# Patient Record
Sex: Male | Born: 1940 | Race: White | Hispanic: No | Marital: Married | Smoking: Former smoker
Health system: Southern US, Community
[De-identification: ages and names within clinical notes are randomized; demographics above are authoritative.]

## PROBLEM LIST (undated history)

## (undated) DIAGNOSIS — M479 Spondylosis, unspecified: Secondary | ICD-10-CM

## (undated) DIAGNOSIS — H269 Unspecified cataract: Secondary | ICD-10-CM

## (undated) DIAGNOSIS — E039 Hypothyroidism, unspecified: Secondary | ICD-10-CM

## (undated) DIAGNOSIS — K6289 Other specified diseases of anus and rectum: Secondary | ICD-10-CM

## (undated) DIAGNOSIS — Z9289 Personal history of other medical treatment: Secondary | ICD-10-CM

## (undated) DIAGNOSIS — H409 Unspecified glaucoma: Secondary | ICD-10-CM

## (undated) DIAGNOSIS — E291 Testicular hypofunction: Secondary | ICD-10-CM

## (undated) DIAGNOSIS — D649 Anemia, unspecified: Secondary | ICD-10-CM

## (undated) DIAGNOSIS — E785 Hyperlipidemia, unspecified: Secondary | ICD-10-CM

## (undated) DIAGNOSIS — T7840XA Allergy, unspecified, initial encounter: Secondary | ICD-10-CM

## (undated) DIAGNOSIS — E559 Vitamin D deficiency, unspecified: Secondary | ICD-10-CM

## (undated) DIAGNOSIS — R011 Cardiac murmur, unspecified: Secondary | ICD-10-CM

## (undated) DIAGNOSIS — K219 Gastro-esophageal reflux disease without esophagitis: Secondary | ICD-10-CM

## (undated) DIAGNOSIS — I8390 Asymptomatic varicose veins of unspecified lower extremity: Secondary | ICD-10-CM

## (undated) DIAGNOSIS — F411 Generalized anxiety disorder: Secondary | ICD-10-CM

## (undated) DIAGNOSIS — C4491 Basal cell carcinoma of skin, unspecified: Secondary | ICD-10-CM

## (undated) HISTORY — DX: Vitamin D deficiency, unspecified: E55.9

## (undated) HISTORY — DX: Testicular hypofunction: E29.1

## (undated) HISTORY — DX: Personal history of other medical treatment: Z92.89

## (undated) HISTORY — DX: Spondylosis, unspecified: M47.9

## (undated) HISTORY — DX: Unspecified glaucoma: H40.9

## (undated) HISTORY — DX: Asymptomatic varicose veins of unspecified lower extremity: I83.90

## (undated) HISTORY — DX: Hyperlipidemia, unspecified: E78.5

## (undated) HISTORY — PX: TUBAL LIGATION: SHX77

## (undated) HISTORY — DX: Other specified diseases of anus and rectum: K62.89

## (undated) HISTORY — DX: Unspecified cataract: H26.9

## (undated) HISTORY — PX: DENTAL SURGERY: SHX609

## (undated) HISTORY — DX: Hypothyroidism, unspecified: E03.9

## (undated) HISTORY — DX: Generalized anxiety disorder: F41.1

## (undated) HISTORY — DX: Allergy, unspecified, initial encounter: T78.40XA

## (undated) HISTORY — DX: Cardiac murmur, unspecified: R01.1

## (undated) HISTORY — PX: WISDOM TOOTH EXTRACTION: SHX21

## (undated) HISTORY — PX: OTHER SURGICAL HISTORY: SHX169

## (undated) HISTORY — DX: Anemia, unspecified: D64.9

## (undated) HISTORY — PX: SPINE SURGERY: SHX786

## (undated) HISTORY — DX: Gastro-esophageal reflux disease without esophagitis: K21.9

## (undated) HISTORY — DX: Basal cell carcinoma of skin, unspecified: C44.91

---
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
## 1998-12-01 ENCOUNTER — Encounter: Payer: Self-pay | Admitting: Family Medicine

## 1998-12-01 ENCOUNTER — Ambulatory Visit (HOSPITAL_COMMUNITY): Admission: RE | Admit: 1998-12-01 | Discharge: 1998-12-01 | Payer: Self-pay

## 2000-06-18 ENCOUNTER — Encounter (INDEPENDENT_AMBULATORY_CARE_PROVIDER_SITE_OTHER): Payer: Self-pay | Admitting: *Deleted

## 2000-06-18 ENCOUNTER — Encounter: Payer: Self-pay | Admitting: Family Medicine

## 2000-06-18 ENCOUNTER — Encounter: Admission: RE | Admit: 2000-06-18 | Discharge: 2000-06-18 | Payer: Self-pay | Admitting: Family Medicine

## 2001-08-21 ENCOUNTER — Encounter: Payer: Self-pay | Admitting: Gastroenterology

## 2001-12-10 DIAGNOSIS — Z9289 Personal history of other medical treatment: Secondary | ICD-10-CM

## 2001-12-10 HISTORY — DX: Personal history of other medical treatment: Z92.89

## 2004-03-26 ENCOUNTER — Emergency Department (HOSPITAL_COMMUNITY): Admission: EM | Admit: 2004-03-26 | Discharge: 2004-03-26 | Payer: Self-pay | Admitting: Emergency Medicine

## 2004-11-01 ENCOUNTER — Encounter: Admission: RE | Admit: 2004-11-01 | Discharge: 2004-11-01 | Payer: Self-pay | Admitting: Family Medicine

## 2005-11-21 ENCOUNTER — Ambulatory Visit: Payer: Self-pay | Admitting: Family Medicine

## 2005-11-29 ENCOUNTER — Ambulatory Visit: Payer: Self-pay | Admitting: Family Medicine

## 2005-12-13 ENCOUNTER — Ambulatory Visit: Payer: Self-pay | Admitting: Family Medicine

## 2007-08-29 ENCOUNTER — Encounter: Payer: Self-pay | Admitting: Family Medicine

## 2007-10-14 ENCOUNTER — Encounter: Admission: RE | Admit: 2007-10-14 | Discharge: 2007-10-14 | Payer: Self-pay | Admitting: Family Medicine

## 2007-11-18 ENCOUNTER — Encounter: Payer: Self-pay | Admitting: Family Medicine

## 2008-07-16 ENCOUNTER — Ambulatory Visit: Payer: Self-pay | Admitting: Family Medicine

## 2008-07-16 LAB — CONVERTED CEMR LAB
Nitrite: NEGATIVE
Protein, U semiquant: NEGATIVE
Specific Gravity, Urine: 1.02
Urobilinogen, UA: 0.2

## 2008-07-23 ENCOUNTER — Ambulatory Visit: Payer: Self-pay | Admitting: Family Medicine

## 2008-07-23 DIAGNOSIS — F411 Generalized anxiety disorder: Secondary | ICD-10-CM

## 2008-07-23 DIAGNOSIS — H409 Unspecified glaucoma: Secondary | ICD-10-CM | POA: Insufficient documentation

## 2008-07-23 DIAGNOSIS — K6289 Other specified diseases of anus and rectum: Secondary | ICD-10-CM

## 2008-07-23 DIAGNOSIS — M4800 Spinal stenosis, site unspecified: Secondary | ICD-10-CM

## 2008-07-23 DIAGNOSIS — M479 Spondylosis, unspecified: Secondary | ICD-10-CM

## 2008-07-23 HISTORY — DX: Unspecified glaucoma: H40.9

## 2008-07-23 HISTORY — DX: Generalized anxiety disorder: F41.1

## 2008-07-23 HISTORY — DX: Other specified diseases of anus and rectum: K62.89

## 2008-07-23 HISTORY — DX: Spondylosis, unspecified: M47.9

## 2008-07-23 LAB — CONVERTED CEMR LAB
ALT: 40 units/L — ABNORMAL HIGH (ref 0–35)
AST: 31 units/L (ref 0–37)
Albumin: 4.1 g/dL (ref 3.5–5.2)
Alkaline Phosphatase: 50 units/L (ref 39–117)
BUN: 15 mg/dL (ref 6–23)
Bilirubin, Direct: 0.2 mg/dL (ref 0.0–0.3)
CO2: 31 meq/L (ref 19–32)
Eosinophils Relative: 5.3 % — ABNORMAL HIGH (ref 0.0–5.0)
GFR calc Af Amer: 80 mL/min
Glucose, Bld: 126 mg/dL — ABNORMAL HIGH (ref 70–99)
HCT: 43 % (ref 36.0–46.0)
HDL: 45.9 mg/dL (ref >39.0)
Hemoglobin: 14.9 g/dL (ref 12.0–15.0)
LDL Cholesterol: 118 mg/dL — ABNORMAL HIGH (ref 0–99)
Lymphocytes Relative: 32.5 % (ref 12.0–46.0)
Monocytes Absolute: 0.5 10*3/uL (ref 0.1–1.0)
Monocytes Relative: 8.5 % (ref 3.0–12.0)
Platelets: 264 10*3/uL (ref 150–400)
Potassium: 3.8 meq/L (ref 3.5–5.1)
RDW: 13 % (ref 11.5–14.6)
Sodium: 142 meq/L (ref 135–145)
Total CHOL/HDL Ratio: 3.8
Total Protein: 6.9 g/dL (ref 6.0–8.3)
WBC: 6 10*3/uL (ref 4.5–10.5)

## 2008-07-27 ENCOUNTER — Encounter: Admission: RE | Admit: 2008-07-27 | Discharge: 2008-07-27 | Payer: Self-pay | Admitting: Family Medicine

## 2009-04-14 ENCOUNTER — Encounter: Payer: Self-pay | Admitting: Family Medicine

## 2010-04-28 ENCOUNTER — Ambulatory Visit: Payer: Self-pay | Admitting: Family Medicine

## 2010-04-28 DIAGNOSIS — R0789 Other chest pain: Secondary | ICD-10-CM

## 2010-04-28 DIAGNOSIS — I8390 Asymptomatic varicose veins of unspecified lower extremity: Secondary | ICD-10-CM

## 2010-04-28 DIAGNOSIS — E559 Vitamin D deficiency, unspecified: Secondary | ICD-10-CM | POA: Insufficient documentation

## 2010-04-28 DIAGNOSIS — E291 Testicular hypofunction: Secondary | ICD-10-CM

## 2010-04-28 DIAGNOSIS — E039 Hypothyroidism, unspecified: Secondary | ICD-10-CM

## 2010-04-28 DIAGNOSIS — K589 Irritable bowel syndrome without diarrhea: Secondary | ICD-10-CM | POA: Insufficient documentation

## 2010-04-28 DIAGNOSIS — D649 Anemia, unspecified: Secondary | ICD-10-CM | POA: Insufficient documentation

## 2010-04-28 DIAGNOSIS — T50995A Adverse effect of other drugs, medicaments and biological substances, initial encounter: Secondary | ICD-10-CM

## 2010-04-28 DIAGNOSIS — E785 Hyperlipidemia, unspecified: Secondary | ICD-10-CM

## 2010-04-28 DIAGNOSIS — R350 Frequency of micturition: Secondary | ICD-10-CM

## 2010-04-28 DIAGNOSIS — K219 Gastro-esophageal reflux disease without esophagitis: Secondary | ICD-10-CM

## 2010-04-28 HISTORY — DX: Hypothyroidism, unspecified: E03.9

## 2010-04-28 HISTORY — DX: Gastro-esophageal reflux disease without esophagitis: K21.9

## 2010-04-28 HISTORY — DX: Vitamin D deficiency, unspecified: E55.9

## 2010-04-28 HISTORY — DX: Testicular hypofunction: E29.1

## 2010-04-28 HISTORY — DX: Anemia, unspecified: D64.9

## 2010-04-28 HISTORY — DX: Hyperlipidemia, unspecified: E78.5

## 2010-04-28 HISTORY — DX: Asymptomatic varicose veins of unspecified lower extremity: I83.90

## 2010-05-10 ENCOUNTER — Encounter: Payer: Self-pay | Admitting: Family Medicine

## 2010-07-05 ENCOUNTER — Encounter (INDEPENDENT_AMBULATORY_CARE_PROVIDER_SITE_OTHER): Payer: Self-pay | Admitting: *Deleted

## 2010-08-16 ENCOUNTER — Encounter: Payer: Self-pay | Admitting: Gastroenterology

## 2010-08-29 ENCOUNTER — Telehealth: Payer: Self-pay | Admitting: Family Medicine

## 2010-09-07 ENCOUNTER — Ambulatory Visit
Admission: RE | Admit: 2010-09-07 | Discharge: 2010-09-07 | Payer: Self-pay | Source: Home / Self Care | Attending: Family Medicine | Admitting: Family Medicine

## 2010-10-09 LAB — CONVERTED CEMR LAB
AST: 21 units/L (ref 0–37)
Alkaline Phosphatase: 44 units/L (ref 39–117)
Basophils Absolute: 0 10*3/uL (ref 0.0–0.1)
Basophils Relative: 0.8 % (ref 0.0–3.0)
Bilirubin, Direct: 0.3 mg/dL (ref 0.0–0.3)
Blood in Urine, dipstick: NEGATIVE
CO2: 30 meq/L (ref 19–32)
Calcium: 9.4 mg/dL (ref 8.4–10.5)
Creatinine, Ser: 0.9 mg/dL (ref 0.4–1.5)
Eosinophils Absolute: 0.1 10*3/uL (ref 0.0–0.7)
GFR calc non Af Amer: 93.73 mL/min (ref >60)
HDL: 43.6 mg/dL (ref >39.00)
Ketones, urine, test strip: NEGATIVE
LDL Cholesterol: 119 mg/dL — ABNORMAL HIGH (ref 0–99)
Lymphocytes Relative: 24.5 % (ref 12.0–46.0)
MCHC: 34.6 g/dL (ref 30.0–36.0)
Monocytes Relative: 8.2 % (ref 3.0–12.0)
Neutrophils Relative %: 63.5 % (ref 43.0–77.0)
Nitrite: NEGATIVE
Protein, U semiquant: NEGATIVE
RBC: 4.22 M/uL (ref 4.22–5.81)
Sodium: 139 meq/L (ref 135–145)
Testosterone: 334.48 ng/dL — ABNORMAL LOW (ref 350.00–890.00)
Total CHOL/HDL Ratio: 4
Triglycerides: 91 mg/dL (ref 0.0–149.0)
Urobilinogen, UA: 0.2
VLDL: 18.2 mg/dL (ref 0.0–40.0)
Vit D, 25-Hydroxy: 32 ng/mL (ref 30–89)
WBC Urine, dipstick: NEGATIVE

## 2010-10-11 NOTE — Assessment & Plan Note (Signed)
Summary: PT RESCHEDULE OV,DR KAPLAN STILL IN PROCEDURE   History of Present Illness Visit Type: new patient Primary GI MD: Melvia Heaps MD [facility] Primary Provider: London Sheer Chief Complaint: pt is having abdominal discomfort that he associates with IBS  GI Review of Systems      Denies abdominal pain, acid reflux, belching, bloating, chest pain, dysphagia with liquids, dysphagia with solids, heartburn, loss of appetite, nausea, vomiting, vomiting blood, weight loss, and  weight gain.      Reports irritable bowel syndrome.     Denies anal fissure, black tarry stools, change in bowel habit, constipation, diarrhea, diverticulosis, fecal incontinence, heme positive stool, hemorrhoids, jaundice, light color stool, liver problems, rectal bleeding, and  rectal pain. Preventive Screening-Counseling & Management  Alcohol-Tobacco     Smoking Status: quit      Drug Use:  no.      Current Medications (verified): 1)  Diazepam 5 Mg Tabs (Diazepam) .Marland Kitchen.. 1 Tid 2)  Protonix 40 Mg Tbec (Pantoprazole Sodium) .Marland Kitchen.. 1 Qd 3)  Proctosol Hc 2.5 % Crea (Hydrocortisone) .... Apply Bid 4)  Xalatan 0.005 % Soln (Latanoprost) .... Both Eyes 5)  Timolol Maleate 0.25 % Soln (Timolol Maleate) .... Left Eye 6)  Meloxicam 15 Mg Tabs (Meloxicam) .Marland Kitchen.. 1 Once Daily For Arthritis 7)  Androgel Pump 1.62% .... Apply 1 Pump On Each Shoulder Each Day 8)  Aspirin 81 Mg Tabs (Aspirin) .... Take 1 Tablet By Mouth Once A Day  Allergies (verified): No Known Drug Allergies  Past History:  Past Medical History: Colon polyps GERD Hyperlipidemia Hypothyroidism  Past Surgical History: none  Family History: No FH of Colon Cancer:  Social History: Married, 3 boys Patient is a former smoker.  Alcohol Use - yes--2 drinks/day Daily Caffeine Use--1 cup/day Illicit Drug Use - no Smoking Status:  quit Drug Use:  no  Review of Systems  The patient denies allergy/sinus, anemia, anxiety-new, arthritis/joint  pain, back pain, blood in urine, breast changes/lumps, confusion, cough, coughing up blood, depression-new, fainting, fatigue, fever, headaches-new, hearing problems, heart murmur, heart rhythm changes, itching, muscle pains/cramps, night sweats, nosebleeds, shortness of breath, skin rash, sleeping problems, sore throat, swelling of feet/legs, swollen lymph glands, thirst - excessive, urination - excessive, urination changes/pain, urine leakage, vision changes, and voice change.    Vital Signs:  Patient profile:   70 year old male Height:      69.5 inches Weight:      210 pounds BMI:     30.68 Pulse rate:   64 / minute Pulse rhythm:   regular BP sitting:   124 / 70  (left arm) Cuff size:   regular  Vitals Entered By: Francee Piccolo CMA Duncan Dull) (August 16, 2010 1:43 PM)   Patient Instructions: 1)  Copy sent to : London Sheer

## 2010-10-11 NOTE — Procedures (Signed)
Summary: Colonoscopy   Colonoscopy  Procedure date:  08/21/2001  Findings:      Results: Polyp.  Results: Diverticulosis.         Patient Name: Tyrone Diaz, Tyrone Diaz MRN:  Procedure Procedures: Colonoscopy CPT: 44010.    with Hot Biopsy(s)CPT: [id number].  Personnel: Endoscopist: Barbette Hair. Arlyce Dice, MD.  Referred By: Barbera Setters Artis Flock, MD.  Exam Location: Exam performed in Outpatient Clinic. Outpatient  Patient Consent: Procedure, Alternatives, Risks and Benefits discussed, consent obtained, from patient.  Indications  Average Risk Screening Routine.  History  Pre-Exam Physical: Performed Aug 21, 2001. Cardio-pulmonary exam, Rectal exam, HEENT exam , Abdominal exam, Extremity exam, Neurological exam, Mental status exam WNL.  Exam Exam: Extent of exam reached: Cecum, extent intended: Cecum.  ASA Classification: I. Tolerance: excellent.  Monitoring: Pulse and BP monitoring, Oximetry used. Supplemental O2 given.  Colon Prep Used Golytely for colon prep. Prep results: good.  Sedation Meds: Patient assessed and found to be appropriate for moderate (conscious) sedation. Fentanyl 100 mcg. Versed 10 mg.  Findings - DIVERTICULOSIS: Descending Colon. ICD9: Sep 09, 1898.  - DIVERTICULOSIS: Sigmoid Colon. ICD9: Sep 09, 1898.  DIVERTICULOSIS: Sigmoid Colon. ICD9: Sep 09, 1898.  POLYP: Sigmoid Colon, Maximum size: 3 mm. Distance from Anus 30 cm. Procedure:  hot biopsy, ICD9: Colon Polyps: 211.3.   Assessment Abnormal examination, see findings above.  Diagnoses: 562.10: Diverticulosis.  211.3: Colon Polyps.   Events  Unplanned Interventions: No intervention was required.  Unplanned Events: There were no complications. Plans Medication Plan: Await pathology.  Patient Education: Patient given standard instructions for: Polyps. Diverticulosis.  Disposition: After procedure patient sent to recovery. After recovery patient sent home.  Scheduling/Referral: Colonoscopy, to  Barbette Hair. Arlyce Dice, MD, If adenoma, around Aug 21, 2004.

## 2010-10-11 NOTE — Miscellaneous (Signed)
Summary: Waiver of Liability for Zostavax  Waiver of Liability for Zostavax   Imported By: Maryln Gottron 05/02/2010 10:39:54  _____________________________________________________________________  External Attachment:    Type:   Image     Comment:   External Document

## 2010-10-11 NOTE — Letter (Signed)
Summary: New Patient letter  [facility]  [address] [place], [place] [address]   Phone: [phone]  Fax: [phone]       07/05/2010 MRN: [id number]  Tyrone Diaz [address] [place], [place]  [address]  Dear Tyrone Diaz,  Welcome to the Gastroenterology Division at [facility].    You are scheduled to see Dr.  Arlyce Dice on 08-16-10 at 1:30p.m. on the 3rd floor at [facility], [address]  We ask that you try to arrive at our office 15 minutes prior to your appointment time to allow for check-in.  We would like you to complete the enclosed self-administered evaluation form prior to your visit and bring it with you on the day of your appointment.  We will review it with you.  Also, please bring a complete list of all your medications or, if you prefer, bring the medication bottles and we will list them.  Please bring your insurance card so that we may make a copy of it.  If your insurance requires a referral to see a specialist, please bring your referral form from your primary care physician.  Co-payments are due at the time of your visit and may be paid by cash, check or credit card.     Your office visit will consist of a consult with your physician (includes a physical exam), any laboratory testing he/she may order, scheduling of any necessary diagnostic testing (e.g. x-ray, ultrasound, CT-scan), and scheduling of a procedure (e.g. Endoscopy, Colonoscopy) if required.  Please allow enough time on your schedule to allow for any/all of these possibilities.    If you cannot keep your appointment, please call [phone] to cancel or reschedule prior to your appointment date.  This allows [place] the opportunity to schedule an appointment for another patient in need of care.  If you do not cancel or reschedule by 5 p.m. the business day prior to your appointment date, you will be charged a $50.00 late cancellation/no-show fee.    Thank you for choosing [facility] for your medical needs.  We appreciate the opportunity to care for you.  Please visit [place] at our website  to learn more about our practice.                     Sincerely,                                                             The Gastroenterology Division

## 2010-10-11 NOTE — Medication Information (Signed)
Summary: Androgel Approved  Androgel Approved   Imported By: Maryln Gottron 05/13/2010 10:08:51  _____________________________________________________________________  External Attachment:    Type:   Image     Comment:   External Document

## 2010-10-11 NOTE — Assessment & Plan Note (Signed)
Summary: emp/pt coming in fasting/cjr   Vital Signs:  Patient profile:   Tyrone Diaz Height:      69.5 inches Weight:      204 pounds BMI:     29.80 O2 Sat:      97 % Temp:     98 degrees F Pulse rate:   65 / minute Pulse rhythm:   regular BP sitting:   120 / 82  (left arm)  Vitals Entered By: Pura Spice, RN (April 28, 2010 8:53 AM) CC: go over problems refills     History of Present Illness: This 70 year old white Diaz retired from [employer] is in to discuss his knuckle problems and get refill of medications He has past history of GERD which is controlled with Protonix History of glaucoma control and treated by Dr. Dr. His main complaint today is that of varicosities of the right calf and popliteal area, has no pain no edema Has had history of some arthritic problem with the joint has been taking meloxicam 50 mg each day p.r.n. 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 Daphene Jaeger in the past for cardiac workup and an echocardiogram and has no problem  Allergies (verified): No Known Drug Allergies  Review of Systems      See HPI  The patient denies anorexia, fever, weight loss, weight gain, vision loss, decreased hearing, hoarseness, chest pain, syncope, dyspnea on exertion, peripheral edema, prolonged cough, headaches, hemoptysis, abdominal pain, melena, hematochezia, severe indigestion/heartburn, hematuria, incontinence, genital sores, muscle weakness, suspicious skin lesions, transient blindness, difficulty walking, depression, unusual weight change, abnormal bleeding, enlarged lymph nodes, angioedema, breast masses, and testicular masses.    Physical  Exam  General:  Well-developed,well-nourished,in no acute distress; alert,appropriate and cooperative throughout examination Head:  Normocephalic and atraumatic without obvious abnormalities. No apparent alopecia or balding. Eyes:  No corneal or conjunctival inflammation noted. EOMI. Perrla. Funduscopic exam benign, without hemorrhages, exudates or papilledema. Vision grossly normal. Ears:  External ear exam shows no significant lesions or deformities.  Otoscopic examination reveals clear canals, tympanic membranes are intact bilaterally without bulging, retraction, inflammation or discharge. Hearing is grossly normal bilaterally. Nose:  External nasal examination shows no deformity or inflammation. Nasal mucosa are pink and moist without lesions or exudates. Mouth:  Oral mucosa and oropharynx without lesions or exudates.  Teeth in good repair. Chest Wall:  No deformities, masses, tenderness or gynecomastia noted. Breasts:  No masses or gynecomastia noted Lungs:  Normal respiratory effort, chest expands symmetrically. Lungs are clear to auscultation, no crackles or wheezes. Heart:  Normal rate and regular rhythm. S1 and S2 normal without gallop, murmur, click, rub or other extra sounds. Abdomen:  Bowel sounds positive,abdomen soft and non-tender without masses, organomegaly or hernias noted. Rectal:  No external abnormalities noted. Normal sphincter tone. No rectal masses or tenderness. Genitalia:  Testes bilaterally descended without nodularity, tenderness or masses. No scrotal masses or lesions. No penis lesions or urethral discharge. Prostate:  Prostate gland firm and smooth, no enlargement, nodularity, tenderness, mass, asymmetry or induration. Msk:  mild to moderate her cast is a right calf and popliteal region nontender Pulses:  R and L carotid,radial,femoral,dorsalis pedis  and posterior tibial pulses are full and equal bilaterally Extremities:  No clubbing, cyanosis, edema, or deformity  noted with normal full range of motion of all joints.   Neurologic:  No cranial nerve deficits noted. Station and gait are normal. Plantar reflexes are down-going bilaterally. DTRs are symmetrical throughout. Sensory, motor and coordinative functions appear intact. Skin:  Intact without suspicious lesions or rashestattoo(s).   Cervical Nodes:  No lymphadenopathy noted Axillary Nodes:  No palpable lymphadenopathy Inguinal Nodes:  No significant adenopathy Psych:  Cognition and judgment appear intact. Alert and cooperative with normal attention span and concentration. No apparent delusions, illusions, hallucinations   Impression & Recommendations:  Problem # 1:  ANXIETY (ICD-300.00) Assessment New  His updated medication list for this problem includes:    Diazepam 5 Mg Tabs (Diazepam) .Marland Kitchen... 1 tid  Orders: Prescription Created Electronically [phone])  Problem # 2:  VARICOSE VEINS, LOWER EXTREMITIES (ICD-454.9) Assessment: New  no tx  Orders: Prescription Created Electronically [phone])  Problem # 3:  GERD (ICD-530.81) Assessment: Improved  His updated medication list for this problem includes:    Protonix 40 Mg Tbec (Pantoprazole sodium) .Marland Kitchen... 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 1 tid 2)  Protonix 40 Mg Tbec (Pantoprazole sodium) .Marland Kitchen.. 1 qd 3)  Proctosol Hc 2.5 % Crea (Hydrocortisone) .... Apply bid 4)  Xalatan 0.005 % Soln (Latanoprost) .... Both eyes 5)  Timolol Maleate 0.25 % Soln (Timolol maleate) .... Left eye 6)  Meloxicam 15 Mg Tabs (Meloxicam) .Marland Kitchen.. 1 once daily for  arthritis 7)  Androgel Pump 1.62%  .... Apply 1 pump on each shoulder each day  Other Orders: Venipuncture (02725) T-Vitamin D (25-Hydroxy) (36644-03474) UA Dipstick w/o Micro (automated)  (81003) EKG w/ Interpretation (93000) Pneumococcal Vaccine (25956) Admin 1st Vaccine (38756) Zoster (Shingles) Vaccine Live (43329) Admin of Any Addtl Vaccine (51884) Specimen Handling (16606) TLB-Lipid Panel (80061-LIPID) TLB-BMP (Basic Metabolic Panel-BMET) (80048-METABOL) TLB-CBC Platelet - w/Differential (85025-CBCD) TLB-Hepatic/Liver Function Pnl (80076-HEPATIC) TLB-TSH (Thyroid Stimulating Hormone) (84443-TSH) TLB-PSA (Prostate Specific Antigen) (84153-PSA) TLB-Testosterone, Total (84403-TESTO)  Patient Instructions: 1)  Will refill medications 2)  Cardizem results of the lab studies and I feel the testosterone level will most likely be due to low and we will start AndroGel Prescriptions: ANDROGEL PUMP 1.62% apply 1 pump on each shoulder each day  #88gms x 5   Entered and Authorized by:   Judithann Sheen MD   Signed by:   Judithann Sheen MD on 05/04/2010   Method used:   Printed then faxed to ...       [facility] [address] [phone]* (retail)       [address] [place], [place]  [address]       Ph: [phone]       Fax: [phone]   RxID:   [id number] MELOXICAM 15 MG TABS (MELOXICAM) 1 once daily for arthritis  #90 x 3   Entered and Authorized by:   Judithann Sheen MD   Signed by:   Judithann Sheen MD on 04/28/2010   Method used:   Print then Give to Patient   RxID:  [id number] PROCTOSOL HC 2.5 % CREA (HYDROCORTISONE) apply bid  #45 gms x 5   Entered and Authorized by:   Judithann Sheen MD   Signed by:   Judithann Sheen MD on 04/28/2010   Method used:   Print then Give to Patient   RxID:   [id number] PROTONIX 40 MG TBEC (PANTOPRAZOLE SODIUM) 1 qd  #90 x 3   Entered and Authorized by:   Judithann Sheen MD   Signed by:    Judithann Sheen MD on 04/28/2010   Method used:   Print then Give to Patient   RxID:   [id number] DIAZEPAM 5 MG TABS (DIAZEPAM) 1 tid  #90 x 1   Entered and Authorized by:   Judithann Sheen MD   Signed by:   Judithann Sheen MD on 04/28/2010   Method used:   Print then Give to Patient   RxID:   [id number]    Eye Exam  02-2010 had eye exam with Dr Elmer Picker     Immunizations Administered:  Pneumonia Vaccine:    Vaccine Type: Pneumovax    Site: right deltoid    Mfr: Merck    Dose: 0.5 ml    Route: IM    Given by: Pura Spice, RN    Exp. Date: 09/10/2011    Lot #: [id number]    VIS given: 04/08/96 version given April 28, 2010.  Zostavax # 1:    Vaccine Type: Zostavax    Site: left deltoid    Mfr: Merck    Dose: 0.5 ml    Route: Cruger    Given by: Pura Spice, RN    Exp. Date: 04/15/2011    Lot #: [id number]    VIS given: 06/23/05 given April 28, 2010.   Laboratory Results   Urine Tests    Routine Urinalysis   Color: yellow Appearance: Clear Glucose: negative   (Normal Range: Negative) Bilirubin: negative   (Normal Range: Negative) Ketone: negative   (Normal Range: Negative) Spec. Gravity: 1.015   (Normal Range: 1.003-1.035) Blood: negative   (Normal Range: Negative) pH: 7.0   (Normal Range: 5.0-8.0) Protein: negative   (Normal Range: Negative) Urobilinogen: 0.2   (Normal Range: 0-1) Nitrite: negative   (Normal Range: Negative) Leukocyte Esterace: negative   (Normal Range: Negative)    Comments: Rita Ohara  April 28, 2010 11:00 AM

## 2010-10-13 NOTE — Progress Notes (Signed)
  Phone Note Call from Patient   Caller: Patient Call For: Judithann Sheen MD Summary of Call: Pt is calling to ask to get his Testosterone levels checked.  Please schedule appt. [phone] Initial call taken by: Lynann Beaver CMA AAMA,  August 29, 2010 11:23 AM  Follow-up for Phone Call        Dr. Scotty Court will call pt to discuss Follow-up by: Trixie Dredge,  September 01, 2010 6:24 PM

## 2011-01-19 ENCOUNTER — Other Ambulatory Visit: Payer: Self-pay | Admitting: Family Medicine

## 2011-01-19 MED ORDER — DIAZEPAM 5 MG PO TABS: 5.0000 mg | ORAL_TABLET | Freq: Three times a day (TID) | ORAL | Status: AC | PRN

## 2011-01-19 NOTE — Telephone Encounter (Signed)
[facility] [place]

## 2011-01-19 NOTE — Telephone Encounter (Signed)
Refill Diazepam until his August cpx. [facility] [facility] [place].

## 2011-05-01 ENCOUNTER — Encounter: Payer: Self-pay | Admitting: Family Medicine

## 2011-05-02 ENCOUNTER — Ambulatory Visit (INDEPENDENT_AMBULATORY_CARE_PROVIDER_SITE_OTHER): Payer: Medicare Other | Admitting: Family Medicine

## 2011-05-02 ENCOUNTER — Encounter: Payer: Self-pay | Admitting: Family Medicine

## 2011-05-02 VITALS — BP 112/62 | HR 51 | Temp 97.8°F | Ht 69.5 in | Wt 209.0 lb

## 2011-05-02 DIAGNOSIS — M48061 Spinal stenosis, lumbar region without neurogenic claudication: Secondary | ICD-10-CM

## 2011-05-02 DIAGNOSIS — E785 Hyperlipidemia, unspecified: Secondary | ICD-10-CM

## 2011-05-02 DIAGNOSIS — M7661 Achilles tendinitis, right leg: Secondary | ICD-10-CM

## 2011-05-02 DIAGNOSIS — E559 Vitamin D deficiency, unspecified: Secondary | ICD-10-CM

## 2011-05-02 DIAGNOSIS — Z136 Encounter for screening for cardiovascular disorders: Secondary | ICD-10-CM

## 2011-05-02 DIAGNOSIS — M7660 Achilles tendinitis, unspecified leg: Secondary | ICD-10-CM

## 2011-05-02 DIAGNOSIS — E039 Hypothyroidism, unspecified: Secondary | ICD-10-CM

## 2011-05-02 DIAGNOSIS — E291 Testicular hypofunction: Secondary | ICD-10-CM

## 2011-05-02 DIAGNOSIS — K589 Irritable bowel syndrome without diarrhea: Secondary | ICD-10-CM

## 2011-05-02 DIAGNOSIS — R351 Nocturia: Secondary | ICD-10-CM

## 2011-05-02 DIAGNOSIS — N401 Enlarged prostate with lower urinary tract symptoms: Secondary | ICD-10-CM

## 2011-05-02 DIAGNOSIS — D649 Anemia, unspecified: Secondary | ICD-10-CM

## 2011-05-02 DIAGNOSIS — I10 Essential (primary) hypertension: Secondary | ICD-10-CM

## 2011-05-02 LAB — LIPID PANEL
HDL: 48.1 mg/dL (ref >39.00)
LDL Cholesterol: 120 mg/dL — ABNORMAL HIGH (ref 0–99)
Total CHOL/HDL Ratio: 4
Triglycerides: 117 mg/dL (ref 0.0–149.0)

## 2011-05-02 LAB — CBC WITH DIFFERENTIAL/PLATELET
Basophils Absolute: 0 10*3/uL (ref 0.0–0.1)
Eosinophils Absolute: 0.1 10*3/uL (ref 0.0–0.7)
Lymphocytes Relative: 23.1 % (ref 12.0–46.0)
Lymphs Abs: 1.4 10*3/uL (ref 0.7–4.0)
MCHC: 33.8 g/dL (ref 30.0–36.0)
Monocytes Relative: 7.4 % (ref 3.0–12.0)
Platelets: 268 10*3/uL (ref 150.0–400.0)
RDW: 14.2 % (ref 11.5–14.6)

## 2011-05-02 LAB — TSH: TSH: 1.29 u[IU]/mL (ref 0.35–5.50)

## 2011-05-02 LAB — POCT URINALYSIS DIPSTICK
Bilirubin, UA: NEGATIVE
Ketones, UA: NEGATIVE
Leukocytes, UA: NEGATIVE
Spec Grav, UA: 1.01
pH, UA: 6

## 2011-05-02 LAB — HEPATIC FUNCTION PANEL
Alkaline Phosphatase: 50 U/L (ref 39–117)
Bilirubin, Direct: 0.2 mg/dL (ref 0.0–0.3)
Total Protein: 7 g/dL (ref 6.0–8.3)

## 2011-05-02 LAB — BASIC METABOLIC PANEL
BUN: 15 mg/dL (ref 6–23)
CO2: 29 mEq/L (ref 19–32)
Calcium: 9.7 mg/dL (ref 8.4–10.5)
GFR: 81.33 mL/min (ref >60.00)
Glucose, Bld: 111 mg/dL — ABNORMAL HIGH (ref 70–99)

## 2011-05-02 MED ORDER — PANTOPRAZOLE SODIUM 40 MG PO TBEC: 40.0000 mg | DELAYED_RELEASE_TABLET | Freq: Every day | ORAL | Status: DC

## 2011-05-02 MED ORDER — IBUPROFEN 800 MG PO TABS: ORAL_TABLET | ORAL | Status: DC

## 2011-05-02 MED ORDER — TESTOSTERONE 20.25 MG/ACT (1.62%) TD GEL: 1.0000 | Freq: Every day | TRANSDERMAL | Status: DC

## 2011-05-02 MED ORDER — DIAZEPAM 5 MG PO TABS: 5.0000 mg | ORAL_TABLET | Freq: Three times a day (TID) | ORAL | Status: DC

## 2011-05-02 MED ORDER — HYOSCYAMINE SULFATE ER 0.375 MG PO TB12: ORAL_TABLET | ORAL | Status: DC

## 2011-05-02 NOTE — Patient Instructions (Signed)
You're doing very well physical examination reveals a healthy male here problem with irritable bowel should be helped with Levbid twice a day and you can decide on whether to continue twice a day are once per day Far as his spinal stenosis and arthritis and inflammation I think it's advantageous to take ibuprofen 800 mg 3 times daily after meals on our continue to use Vicodin as you see fit I will call results and lab either tonight or tomorrow Medications have been refilled

## 2011-05-03 ENCOUNTER — Other Ambulatory Visit: Payer: Self-pay

## 2011-05-03 LAB — VITAMIN D 25 HYDROXY (VIT D DEFICIENCY, FRACTURES): Vit D, 25-Hydroxy: 30 ng/mL (ref 30–89)

## 2011-05-03 MED ORDER — TESTOSTERONE 20.25 MG/ACT (1.62%) TD GEL: 4.0000 | Freq: Every day | TRANSDERMAL | Status: DC

## 2011-05-03 NOTE — Telephone Encounter (Signed)
Pt is aware rx has been sent to pharmacy.

## 2011-05-03 NOTE — Progress Notes (Signed)
Quick Note:  Pt aware ______ 

## 2011-05-08 NOTE — Progress Notes (Signed)
  Subjective:    Patient ID: Tyrone Diaz, male    DOB: 02/02/1941, 70 y.o.   MRN: [id number] 70 year old white married retired male is in today to discuss his medical problems, for medications and get necessary lab studies. 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 and he feels that it is beneficial   Review of Systems see history of present illness patient is an avid Public librarian  and when he plays tennis no shortness of breath no chest pain        Physical Exam the patient is a well-developed well-nourished slightly obese white male in no distress HEENT negative Lungs clear to palpation percussion and auscultation no rales no dullness Heart no evidence of cardiomegaly heart sounds are good regular no murmurs peripheral pulses good bilaterally  EKG normal Abdomen liver spleen kidneys  Are normal, bowel sounds slightly hyperactive Genitalia normal Rectal exam reveals prostate to be normal Spine negative examination Extremities tenderness right Achilles tendon Skin no abnormalities noted          Assessment & Plan:  Hypogonadism continue Androgel can increase to 4 applications per day Irritable bowel syndrome start Levbid 0.375 mg twice a day Genella Rife continue protonix Achilles tendinitis ibuprofen  800 mg 3 times daily after meals, take hydrocodone as needed Glaucoma continue treatment t prescribed by Dr. Elmer Picker Continue tennis and regular exercise

## 2011-05-10 ENCOUNTER — Telehealth: Payer: Self-pay | Admitting: Family Medicine

## 2011-05-10 NOTE — Telephone Encounter (Signed)
A copy of pt's labs have been sent.

## 2011-05-10 NOTE — Telephone Encounter (Signed)
Message on triage line. Pt would like a copy of his most recent labs mailed to his home. Please call if necessary.

## 2011-05-17 ENCOUNTER — Other Ambulatory Visit: Payer: Self-pay

## 2011-05-17 MED ORDER — TESTOSTERONE 20.25 MG/ACT (1.62%) TD GEL: 4.0000 | Freq: Every day | TRANSDERMAL | Status: DC

## 2011-05-29 ENCOUNTER — Telehealth: Payer: Self-pay | Admitting: *Deleted

## 2011-05-29 NOTE — Telephone Encounter (Signed)
Pt is asking for a refill on Vicodin 5/500.

## 2011-05-31 MED ORDER — HYDROCODONE-ACETAMINOPHEN 5-500 MG PO TABS: 1.0000 | ORAL_TABLET | Freq: Four times a day (QID) | ORAL | Status: DC | PRN

## 2011-05-31 NOTE — Telephone Encounter (Signed)
Rx sent in to pharmacy. 

## 2011-05-31 NOTE — Telephone Encounter (Signed)
Call in #60 with 2 rf 

## 2011-09-20 DIAGNOSIS — H04129 Dry eye syndrome of unspecified lacrimal gland: Secondary | ICD-10-CM | POA: Diagnosis not present

## 2011-09-20 DIAGNOSIS — H2510 Age-related nuclear cataract, unspecified eye: Secondary | ICD-10-CM | POA: Diagnosis not present

## 2011-09-20 DIAGNOSIS — H4011X0 Primary open-angle glaucoma, stage unspecified: Secondary | ICD-10-CM | POA: Diagnosis not present

## 2011-09-20 DIAGNOSIS — H1045 Other chronic allergic conjunctivitis: Secondary | ICD-10-CM | POA: Diagnosis not present

## 2011-09-26 ENCOUNTER — Telehealth: Payer: Self-pay | Admitting: *Deleted

## 2011-09-26 NOTE — Telephone Encounter (Signed)
Not yet. We are in the process of calling each and every patient to encourage them to do so. Haven't reached his name on the list yet.

## 2011-09-26 NOTE — Telephone Encounter (Signed)
Pt wants a referral to Urology as he feels the Androgel is not working.

## 2011-09-26 NOTE — Telephone Encounter (Signed)
Has this patient been assigned to any providers yet?

## 2011-09-27 NOTE — Telephone Encounter (Signed)
This is not an urgent issue. Let's wait and send this to whoever sees him

## 2011-09-27 NOTE — Telephone Encounter (Signed)
Pt has been informed of info below as noted. Pt is going to try and est with [facility] location.

## 2011-09-27 NOTE — Telephone Encounter (Signed)
Tyrone Diaz, can you reach out to this pt and make him aware of Dr. Claris Che recommendation and URGE him to establish with someone so he can obtain this referral?

## 2011-10-02 DIAGNOSIS — M62838 Other muscle spasm: Secondary | ICD-10-CM | POA: Diagnosis not present

## 2011-10-02 DIAGNOSIS — M999 Biomechanical lesion, unspecified: Secondary | ICD-10-CM | POA: Diagnosis not present

## 2011-10-02 DIAGNOSIS — M5430 Sciatica, unspecified side: Secondary | ICD-10-CM | POA: Diagnosis not present

## 2011-10-04 DIAGNOSIS — M5430 Sciatica, unspecified side: Secondary | ICD-10-CM | POA: Diagnosis not present

## 2011-10-04 DIAGNOSIS — M62838 Other muscle spasm: Secondary | ICD-10-CM | POA: Diagnosis not present

## 2011-10-04 DIAGNOSIS — M999 Biomechanical lesion, unspecified: Secondary | ICD-10-CM | POA: Diagnosis not present

## 2011-10-19 ENCOUNTER — Encounter: Payer: Self-pay | Admitting: Internal Medicine

## 2011-10-19 ENCOUNTER — Ambulatory Visit (INDEPENDENT_AMBULATORY_CARE_PROVIDER_SITE_OTHER): Payer: Medicare Other | Admitting: Internal Medicine

## 2011-10-19 VITALS — BP 124/70 | HR 55 | Temp 98.4°F | Ht 70.0 in | Wt 197.0 lb

## 2011-10-19 DIAGNOSIS — R7309 Other abnormal glucose: Secondary | ICD-10-CM

## 2011-10-19 DIAGNOSIS — K589 Irritable bowel syndrome without diarrhea: Secondary | ICD-10-CM

## 2011-10-19 DIAGNOSIS — E291 Testicular hypofunction: Secondary | ICD-10-CM

## 2011-10-19 DIAGNOSIS — E785 Hyperlipidemia, unspecified: Secondary | ICD-10-CM

## 2011-10-19 DIAGNOSIS — R739 Hyperglycemia, unspecified: Secondary | ICD-10-CM

## 2011-10-19 DIAGNOSIS — M4800 Spinal stenosis, site unspecified: Secondary | ICD-10-CM | POA: Diagnosis not present

## 2011-10-19 NOTE — Progress Notes (Signed)
  Subjective:    Patient ID: Tyrone Diaz, male    DOB: January 03, 1941, 71 y.o.   MRN: [id number]  HPI New patient, transferring from Dr. Scotty Court The chart is reviewed, including previous visit notes and labs. Past medical history is updated. I notice his CBGs have been elevated twice and he also has some mild hyperkalemia. Needs labs. His main concern today is hypogonadism, see assessment and plan.   Past Medical History: Hypogonadism Colon polyps IBS Glaucoma, Sees ophthalmology  Anxiety,  valium prn Spinal stenosis, on hydrocodone , Rx by pain management Dr Arvil Persons   Past Surgical History: none  Family History: Prostate cancer -- no Colon Cancer--no Diabetes-- no CAD-- F MI at age 84  Stroke-- M side  Mom 49 y/o  Social History: Married, 3 boys Tobacco-- quit 1979 Alcohol Use - socially Drug Use:  no    Review of Systems No chest pain or shortness of breath Denies any headaches, no dysuria, difficulty urinating. No nausea, vomiting, diarrhea. No blood in the stools.    Objective:   Physical Exam  Constitutional: He is oriented to person, place, and time. He appears well-developed and well-nourished.  HENT:  Head: Normocephalic and atraumatic.  Neck: No thyromegaly present.  Cardiovascular: Normal rate, regular rhythm and normal heart sounds.   No murmur heard. Pulmonary/Chest: Effort normal and breath sounds normal. No respiratory distress. He has no wheezes. He has no rales. He exhibits no tenderness.  Musculoskeletal: He exhibits no edema.  Neurological: He is alert and oriented to person, place, and time.  Psychiatric: He has a normal mood and affect. His behavior is normal. Judgment and thought content normal.      Assessment & Plan:  Today , I spent more than 30  min with the patient, >50% of the time counseling, and /or reviewing the chart and labs ordered by other providers

## 2011-10-19 NOTE — Assessment & Plan Note (Addendum)
In 2011, the patient complained of decreased libido and ED His testosterone was 334, slightly low and he was started on  HRT. Subsequent  testosterone level was 244 and he was recommended to increase the testosterone dose. His last testosterone   was 206, he was then recommended to increase testosterone to 4 pumps daily. His libido and 80 has improved. On chart review however he never had a FSH, LH or prolactin level drawn for baseline. Plan: We are checking a testosterone level today but at the same time will recommend to discontinue testosterone, come back in 10 weeks to get a new baseline of testosterone, and check FSH, LH and prolactin. Further advice in 10 weeks.

## 2011-10-19 NOTE — Assessment & Plan Note (Signed)
Last LDL 120. Family history of heart disease + He is  nonfasting, recheck a cholesterol done on return to the office.

## 2011-10-19 NOTE — Assessment & Plan Note (Signed)
Has been diagnosed with IBS before, currently taking no medicines.

## 2011-10-19 NOTE — Assessment & Plan Note (Signed)
Pain medication prescribed by pain management

## 2011-10-29 ENCOUNTER — Telehealth: Payer: Self-pay | Admitting: Internal Medicine

## 2011-10-29 NOTE — Telephone Encounter (Signed)
Was recommended labs at last OV, I don't see any report. Please f/u on this, redraw if needed .

## 2011-10-30 MED ORDER — FLUTICASONE PROPIONATE 50 MCG/ACT NA SUSP: 2.0000 | Freq: Every day | NASAL | Status: DC

## 2011-10-30 NOTE — Telephone Encounter (Signed)
Spoke to Pt who indicated that he thought that he was to have labs done in 10 weeks. Advise Pt he was to have labs done the day of appt then in 10 weeks. Pt ok will call back to schedule labs appt. BMP,Testosterone, A1C WU:[id number] HYPOFUNCTION, hyperglycemia, hyperlipidemia.

## 2011-11-09 DIAGNOSIS — L820 Inflamed seborrheic keratosis: Secondary | ICD-10-CM | POA: Diagnosis not present

## 2011-11-09 DIAGNOSIS — L821 Other seborrheic keratosis: Secondary | ICD-10-CM | POA: Diagnosis not present

## 2011-11-09 DIAGNOSIS — L819 Disorder of pigmentation, unspecified: Secondary | ICD-10-CM | POA: Diagnosis not present

## 2011-11-09 DIAGNOSIS — D239 Other benign neoplasm of skin, unspecified: Secondary | ICD-10-CM | POA: Diagnosis not present

## 2011-12-11 DIAGNOSIS — H538 Other visual disturbances: Secondary | ICD-10-CM | POA: Diagnosis not present

## 2011-12-12 DIAGNOSIS — M48061 Spinal stenosis, lumbar region without neurogenic claudication: Secondary | ICD-10-CM | POA: Diagnosis not present

## 2011-12-12 DIAGNOSIS — G894 Chronic pain syndrome: Secondary | ICD-10-CM | POA: Diagnosis not present

## 2011-12-22 DIAGNOSIS — M999 Biomechanical lesion, unspecified: Secondary | ICD-10-CM | POA: Diagnosis not present

## 2011-12-22 DIAGNOSIS — M62838 Other muscle spasm: Secondary | ICD-10-CM | POA: Diagnosis not present

## 2011-12-22 DIAGNOSIS — M5430 Sciatica, unspecified side: Secondary | ICD-10-CM | POA: Diagnosis not present

## 2011-12-28 ENCOUNTER — Ambulatory Visit (INDEPENDENT_AMBULATORY_CARE_PROVIDER_SITE_OTHER): Payer: Medicare Other | Admitting: Internal Medicine

## 2011-12-28 VITALS — BP 128/74 | HR 56 | Temp 97.4°F | Wt 197.0 lb

## 2011-12-28 DIAGNOSIS — E785 Hyperlipidemia, unspecified: Secondary | ICD-10-CM

## 2011-12-28 DIAGNOSIS — J309 Allergic rhinitis, unspecified: Secondary | ICD-10-CM | POA: Diagnosis not present

## 2011-12-28 DIAGNOSIS — E291 Testicular hypofunction: Secondary | ICD-10-CM | POA: Diagnosis not present

## 2011-12-28 LAB — LIPID PANEL: Total CHOL/HDL Ratio: 4

## 2011-12-28 MED ORDER — FLUTICASONE PROPIONATE 50 MCG/ACT NA SUSP: 2.0000 | Freq: Every day | NASAL | Status: DC

## 2011-12-28 NOTE — Progress Notes (Signed)
  Subjective:    Patient ID: Tyrone Diaz, male    DOB: 1941-03-10, 71 y.o.   MRN: [id number]  HPI Followup from previous visit Hypogonadism, he discontinue HRT as recommended. Has noticed no changes on his  energy level, libido is at baseline. Allergies, he uses Flonase and Claritin seasonally with good results. Needs a RF  Past Medical History:  Hypogonadism  Colon polyps  IBS  Glaucoma, Sees ophthalmology  Anxiety, valium prn  Spinal stenosis, on hydrocodone , Rx by pain management Dr Arvil Persons  Past Surgical History:  none  Family History:  Prostate cancer -- no  Colon Cancer--no  Diabetes-- no  CAD-- F MI at age 71  Stroke-- M side  Mom 11 y/o  Social History:  Married, 3 boys  Tobacco-- quit 1979  Alcohol Use - socially  Drug Use: no  Review of Systems Denies any hot flashes No fever, chills. No cough or chest congestion.      Objective:   Physical Exam General -- alert, well-developed, and well-nourished. NAD  HEENT -- eats: wax R>>L, throat w/o redness, face symmetric and not tender to palpation Neurologic-- alert & oriented X3 and strength normal in all extremities. Psych-- Cognition and judgment appear intact. Alert and cooperative with normal attention span and concentration.  not anxious appearing and not depressed appearing.       Assessment & Plan:

## 2011-12-28 NOTE — Assessment & Plan Note (Signed)
Symptoms well-controlled with seasonal use of Flonase and over-the-counter Claritin. Refill Flonase

## 2011-12-28 NOTE — Assessment & Plan Note (Signed)
Will check a fasting lipid profile 

## 2011-12-28 NOTE — Assessment & Plan Note (Signed)
Patient discontinue HRT 10 weeks ago, will check labs

## 2011-12-29 ENCOUNTER — Encounter: Payer: Self-pay | Admitting: Internal Medicine

## 2011-12-29 LAB — TESTOSTERONE, FREE, TOTAL, SHBG
Testosterone, Free: 84.1 pg/mL (ref 47.0–244.0)
Testosterone-% Free: 2.2 % (ref 1.6–2.9)
Testosterone: 386.17 ng/dL (ref 300–890)

## 2012-02-20 DIAGNOSIS — M999 Biomechanical lesion, unspecified: Secondary | ICD-10-CM | POA: Diagnosis not present

## 2012-02-20 DIAGNOSIS — M5430 Sciatica, unspecified side: Secondary | ICD-10-CM | POA: Diagnosis not present

## 2012-02-20 DIAGNOSIS — M62838 Other muscle spasm: Secondary | ICD-10-CM | POA: Diagnosis not present

## 2012-02-23 DIAGNOSIS — M62838 Other muscle spasm: Secondary | ICD-10-CM | POA: Diagnosis not present

## 2012-02-23 DIAGNOSIS — M999 Biomechanical lesion, unspecified: Secondary | ICD-10-CM | POA: Diagnosis not present

## 2012-02-23 DIAGNOSIS — M5430 Sciatica, unspecified side: Secondary | ICD-10-CM | POA: Diagnosis not present

## 2012-03-20 DIAGNOSIS — H21559 Recession of chamber angle, unspecified eye: Secondary | ICD-10-CM | POA: Diagnosis not present

## 2012-03-20 DIAGNOSIS — H4011X0 Primary open-angle glaucoma, stage unspecified: Secondary | ICD-10-CM | POA: Diagnosis not present

## 2012-03-20 DIAGNOSIS — H04129 Dry eye syndrome of unspecified lacrimal gland: Secondary | ICD-10-CM | POA: Diagnosis not present

## 2012-03-29 DIAGNOSIS — M79609 Pain in unspecified limb: Secondary | ICD-10-CM | POA: Diagnosis not present

## 2012-03-29 DIAGNOSIS — M48061 Spinal stenosis, lumbar region without neurogenic claudication: Secondary | ICD-10-CM | POA: Diagnosis not present

## 2012-03-29 DIAGNOSIS — G894 Chronic pain syndrome: Secondary | ICD-10-CM | POA: Diagnosis not present

## 2012-04-01 ENCOUNTER — Other Ambulatory Visit (HOSPITAL_BASED_OUTPATIENT_CLINIC_OR_DEPARTMENT_OTHER): Payer: Self-pay | Admitting: Physical Medicine and Rehabilitation

## 2012-04-01 ENCOUNTER — Other Ambulatory Visit: Payer: Self-pay | Admitting: *Deleted

## 2012-04-01 ENCOUNTER — Ambulatory Visit (HOSPITAL_BASED_OUTPATIENT_CLINIC_OR_DEPARTMENT_OTHER)
Admission: RE | Admit: 2012-04-01 | Discharge: 2012-04-01 | Disposition: A | Discharge status: Home / Self Care | Payer: Medicare Other | Source: Ambulatory Visit | Attending: Physical Medicine and Rehabilitation | Admitting: Physical Medicine and Rehabilitation

## 2012-04-01 DIAGNOSIS — M79609 Pain in unspecified limb: Secondary | ICD-10-CM | POA: Insufficient documentation

## 2012-04-01 DIAGNOSIS — M7730 Calcaneal spur, unspecified foot: Secondary | ICD-10-CM | POA: Insufficient documentation

## 2012-04-01 DIAGNOSIS — T148XXA Other injury of unspecified body region, initial encounter: Secondary | ICD-10-CM

## 2012-04-01 DIAGNOSIS — M19079 Primary osteoarthritis, unspecified ankle and foot: Secondary | ICD-10-CM | POA: Diagnosis not present

## 2012-04-01 MED ORDER — HYDROCORTISONE 2.5 % RE CREA: TOPICAL_CREAM | Freq: Two times a day (BID) | RECTAL | Status: AC

## 2012-04-01 NOTE — Telephone Encounter (Signed)
Okay to refill x1. Please ask patient what is he actually using it for

## 2012-04-01 NOTE — Telephone Encounter (Signed)
He is using it for rectal itch

## 2012-04-19 DIAGNOSIS — M779 Enthesopathy, unspecified: Secondary | ICD-10-CM | POA: Diagnosis not present

## 2012-04-29 ENCOUNTER — Encounter: Payer: Medicare Other | Admitting: Internal Medicine

## 2012-05-16 ENCOUNTER — Encounter: Payer: Medicare Other | Admitting: Internal Medicine

## 2012-05-27 DIAGNOSIS — M999 Biomechanical lesion, unspecified: Secondary | ICD-10-CM | POA: Diagnosis not present

## 2012-05-27 DIAGNOSIS — M62838 Other muscle spasm: Secondary | ICD-10-CM | POA: Diagnosis not present

## 2012-05-27 DIAGNOSIS — M5430 Sciatica, unspecified side: Secondary | ICD-10-CM | POA: Diagnosis not present

## 2012-05-30 DIAGNOSIS — M5430 Sciatica, unspecified side: Secondary | ICD-10-CM | POA: Diagnosis not present

## 2012-05-30 DIAGNOSIS — M999 Biomechanical lesion, unspecified: Secondary | ICD-10-CM | POA: Diagnosis not present

## 2012-05-30 DIAGNOSIS — M62838 Other muscle spasm: Secondary | ICD-10-CM | POA: Diagnosis not present

## 2012-08-05 ENCOUNTER — Ambulatory Visit (INDEPENDENT_AMBULATORY_CARE_PROVIDER_SITE_OTHER): Payer: Medicare Other | Admitting: Internal Medicine

## 2012-08-05 ENCOUNTER — Encounter: Payer: Self-pay | Admitting: Internal Medicine

## 2012-08-05 VITALS — BP 134/70 | HR 66 | Temp 97.5°F | Ht 70.25 in | Wt 206.0 lb

## 2012-08-05 DIAGNOSIS — R739 Hyperglycemia, unspecified: Secondary | ICD-10-CM

## 2012-08-05 DIAGNOSIS — R7309 Other abnormal glucose: Secondary | ICD-10-CM | POA: Diagnosis not present

## 2012-08-05 DIAGNOSIS — E559 Vitamin D deficiency, unspecified: Secondary | ICD-10-CM | POA: Diagnosis not present

## 2012-08-05 DIAGNOSIS — Z23 Encounter for immunization: Secondary | ICD-10-CM

## 2012-08-05 DIAGNOSIS — Z0000 Encounter for general adult medical examination without abnormal findings: Secondary | ICD-10-CM | POA: Diagnosis not present

## 2012-08-05 DIAGNOSIS — M4800 Spinal stenosis, site unspecified: Secondary | ICD-10-CM | POA: Diagnosis not present

## 2012-08-05 DIAGNOSIS — E049 Nontoxic goiter, unspecified: Secondary | ICD-10-CM

## 2012-08-05 DIAGNOSIS — E785 Hyperlipidemia, unspecified: Secondary | ICD-10-CM

## 2012-08-05 DIAGNOSIS — F411 Generalized anxiety disorder: Secondary | ICD-10-CM

## 2012-08-05 LAB — COMPREHENSIVE METABOLIC PANEL
AST: 19 U/L (ref 0–37)
Alkaline Phosphatase: 44 U/L (ref 39–117)
Glucose, Bld: 110 mg/dL — ABNORMAL HIGH (ref 70–99)
Potassium: 4.1 mEq/L (ref 3.5–5.1)
Sodium: 139 mEq/L (ref 135–145)
Total Bilirubin: 0.9 mg/dL (ref 0.3–1.2)
Total Protein: 7.3 g/dL (ref 6.0–8.3)

## 2012-08-05 LAB — CBC WITH DIFFERENTIAL/PLATELET
Basophils Absolute: 0 10*3/uL (ref 0.0–0.1)
Basophils Relative: 0.8 % (ref 0.0–3.0)
Eosinophils Absolute: 0.1 10*3/uL (ref 0.0–0.7)
Lymphocytes Relative: 29.4 % (ref 12.0–46.0)
MCHC: 33.8 g/dL (ref 30.0–36.0)
Monocytes Relative: 8 % (ref 3.0–12.0)
Neutrophils Relative %: 59.5 % (ref 43.0–77.0)
RBC: 4.09 Mil/uL — ABNORMAL LOW (ref 4.22–5.81)
RDW: 13.6 % (ref 11.5–14.6)

## 2012-08-05 LAB — LIPID PANEL
Cholesterol: 182 mg/dL (ref 0–200)
LDL Cholesterol: 118 mg/dL — ABNORMAL HIGH (ref 0–99)
Total CHOL/HDL Ratio: 4
VLDL: 12.8 mg/dL (ref 0.0–40.0)

## 2012-08-05 MED ORDER — FLUTICASONE PROPIONATE 50 MCG/ACT NA SUSP: 2.0000 | Freq: Every day | NASAL | Status: DC

## 2012-08-05 MED ORDER — DIAZEPAM 5 MG PO TABS: 5.0000 mg | ORAL_TABLET | Freq: Three times a day (TID) | ORAL | Status: DC

## 2012-08-05 NOTE — Assessment & Plan Note (Addendum)
Td 07 zostavax and pneumonia shot 2011 Flu shot today  Cscope 2002, due for one, plans to work on that 09-2012  DRE normal, PSA is consistently stable, no PSA today. Continue with his healthy lifestyle.

## 2012-08-05 NOTE — Assessment & Plan Note (Signed)
Last FLP very good, recheck

## 2012-08-05 NOTE — Assessment & Plan Note (Signed)
Last vit d within range

## 2012-08-05 NOTE — Assessment & Plan Note (Signed)
Refill meds, symptoms well-controlled

## 2012-08-05 NOTE — Progress Notes (Signed)
  Subjective:    Patient ID: Tyrone Diaz, male    DOB: April 23, 1941, 71 y.o.   MRN: [id number]  HPI Here for Medicare AWV:  1. Risk factors based on Past M, S, F history: reviewed 2. Physical Activities: treadmill daily, yard work  3. Depression/mood: no problems noted, reported  4. Hearing: high freq hearing loss, h/o exposure to noise, no need for hearing aid, has not seen the hearing doctor  5. ADL's:  independent 6. Fall Risk: no recent falls, see instructions 7. home Safety: does feelsafe at home  8. Height, weight, &visual acuity: see VS, vision ok w/ glasses  9. Counseling: provided 10. Labs ordered based on risk factors: if needed  11. Referral Coordination: if needed 12.  Care Plan, see assessment and plan  13.   Cognitive Assessment: motor skills and cognition wnl  In addition, today we discussed the following: Anxiety, needs a refill on Valium Spinal stenosis, symptoms well-controlled with hydrocodone and Advil which he takes when necessary, no apparent side effects. Allergies, on Claritin-D? Recommend to stay on Claritin without the "D". Glaucoma, sees the eye Dr. routinely.  Past Medical History:   Hypogonadism   IBS   Glaucoma, Sees ophthalmology   Anxiety, valium prn   Spinal stenosis, on hydrocodone , Rx by pain management Dr Arvil Persons   H/o goiter, Bx remotely ~ 1997  Past Surgical History:   tonsilectomy  Family History:   Prostate cancer -- no   Colon Cancer--no   Diabetes-- no   CAD-- F MI at age 8   Stroke-- M side   Mom 38 y/o    Social History:   Married, 3 boys   Tobacco-- quit 1979   Alcohol Use - socially   Drug Use: no Fully retired     Review of Systems No chest pain or shortness of breath No nausea vomiting or diarrhea. No dysuria or gross hematuria       Objective:   Physical Exam General -- alert, well-developed, and well-nourished.   Neck --no thyromegaly  Lungs -- normal respiratory effort, no intercostal retractions, no  accessory muscle use, and normal breath sounds.   Heart-- normal rate, regular rhythm, no murmur, and no gallop.   Abdomen--soft, non-tender, no distention, no masses, no HSM, no guarding, and no rigidity.   Extremities-- no pretibial edema bilaterally Rectal-- No external abnormalities noted. Normal sphincter tone. No rectal masses or tenderness. Brown stool, Hemoccult negative Prostate:  Prostate gland firm and smooth, no enlargement, nodularity, tenderness, mass, asymmetry or induration. Neurologic-- alert & oriented X3 and strength normal in all extremities. Psych-- Cognition and judgment appear intact. Alert and cooperative with normal attention span and concentration.  not anxious appearing and not depressed appearing.       Assessment & Plan:

## 2012-08-05 NOTE — Assessment & Plan Note (Signed)
Well controlled with current meds, managed by Dr. Willadean Carol

## 2012-08-05 NOTE — Patient Instructions (Signed)

## 2012-08-13 ENCOUNTER — Telehealth: Payer: Self-pay | Admitting: *Deleted

## 2012-08-13 NOTE — Telephone Encounter (Signed)
Prior authorization request has been received from pharmacy. Prior [employer] form has been requested.

## 2012-08-15 NOTE — Telephone Encounter (Signed)
Form received, completed, & faxed back.

## 2012-08-16 NOTE — Telephone Encounter (Signed)
Prior auth approved received. Pt & pharmacy made aware.

## 2012-09-23 DIAGNOSIS — H4011X0 Primary open-angle glaucoma, stage unspecified: Secondary | ICD-10-CM | POA: Diagnosis not present

## 2012-09-23 DIAGNOSIS — H2510 Age-related nuclear cataract, unspecified eye: Secondary | ICD-10-CM | POA: Diagnosis not present

## 2012-09-23 DIAGNOSIS — H43819 Vitreous degeneration, unspecified eye: Secondary | ICD-10-CM | POA: Diagnosis not present

## 2012-09-23 DIAGNOSIS — H40059 Ocular hypertension, unspecified eye: Secondary | ICD-10-CM | POA: Diagnosis not present

## 2012-09-23 DIAGNOSIS — H409 Unspecified glaucoma: Secondary | ICD-10-CM | POA: Diagnosis not present

## 2012-10-04 ENCOUNTER — Telehealth: Payer: Self-pay | Admitting: *Deleted

## 2012-10-04 NOTE — Telephone Encounter (Signed)
Error

## 2012-11-11 DIAGNOSIS — K029 Dental caries, unspecified: Secondary | ICD-10-CM | POA: Diagnosis not present

## 2013-01-03 ENCOUNTER — Telehealth: Payer: Self-pay | Admitting: Internal Medicine

## 2013-01-03 MED ORDER — FLUTICASONE PROPIONATE 50 MCG/ACT NA SUSP: 2.0000 | Freq: Every day | NASAL | Status: DC

## 2013-01-03 NOTE — Telephone Encounter (Signed)
Refill done.  

## 2013-01-03 NOTE — Telephone Encounter (Signed)
*  Rx from [facility]* Fluticasone .Marland Kitchen No other information given.

## 2013-02-05 ENCOUNTER — Ambulatory Visit (INDEPENDENT_AMBULATORY_CARE_PROVIDER_SITE_OTHER): Payer: Medicare Other | Admitting: Internal Medicine

## 2013-02-05 VITALS — BP 130/80 | HR 61 | Temp 98.1°F | Wt 208.0 lb

## 2013-02-05 DIAGNOSIS — T169XXA Foreign body in ear, unspecified ear, initial encounter: Secondary | ICD-10-CM

## 2013-02-05 DIAGNOSIS — T161XXA Foreign body in right ear, initial encounter: Secondary | ICD-10-CM

## 2013-02-05 NOTE — Progress Notes (Signed)
  Subjective:    Patient ID: Tyrone Diaz, male    DOB: [birth year], 71 y.o.   MRN: [id number]  HPI Acute visit Right ear discomfort for 2 days, "like something is in there". He tried to rinse the ear with H2O2 with no results.  Past Medical History:   Hypogonadism   IBS   Glaucoma, Sees ophthalmology   Anxiety, valium prn   Spinal stenosis, on hydrocodone , Rx by pain management Dr Arvil Persons   H/o goiter, Bx remotely ~ 1997  Past Surgical History:   tonsilectomy  Family History:   Prostate cancer -- no   Colon Cancer--no   Diabetes-- no   CAD-- F MI at age 89   Stroke-- M side   Mom 89 y/o     Social History:   Married, 3 boys   Tobacco-- quit 1979   Alcohol Use - socially   Drug Use: no Fully retired    Review of Systems No recent URI symptoms. No ear discharge.     Objective:   Physical Exam BP 130/80  Pulse 61  Temp(Src) 98.1 F (36.7 C) (Oral)  Wt 208 lb (94.348 kg)  BMI 29.64 kg/m2  SpO2 93% General -- alert, well-developed, Nad  HEENT --   Left ear normal Right year has a soft plastic piece , is removed with forceps. Ear canal is slightly red but not swollen, and there is no discharge or odor. Tympanic membranes normal.  Psych-- Cognition and judgment appear intact. Alert and cooperative with normal attention span and concentration.  not anxious appearing and not depressed appearing.        Assessment & Plan:  FB R ear, FB removed w/o problems, it was a ear piece he used few days ago. Pt will call if redness, pain or d/c at the ear.

## 2013-02-06 ENCOUNTER — Encounter: Payer: Self-pay | Admitting: Internal Medicine

## 2013-03-24 DIAGNOSIS — H4011X0 Primary open-angle glaucoma, stage unspecified: Secondary | ICD-10-CM | POA: Diagnosis not present

## 2013-03-24 DIAGNOSIS — H409 Unspecified glaucoma: Secondary | ICD-10-CM | POA: Diagnosis not present

## 2013-03-24 DIAGNOSIS — H04129 Dry eye syndrome of unspecified lacrimal gland: Secondary | ICD-10-CM | POA: Diagnosis not present

## 2013-05-06 DIAGNOSIS — IMO0002 Reserved for concepts with insufficient information to code with codable children: Secondary | ICD-10-CM | POA: Diagnosis not present

## 2013-05-06 DIAGNOSIS — G894 Chronic pain syndrome: Secondary | ICD-10-CM | POA: Diagnosis not present

## 2013-05-06 DIAGNOSIS — M48061 Spinal stenosis, lumbar region without neurogenic claudication: Secondary | ICD-10-CM | POA: Diagnosis not present

## 2013-05-28 ENCOUNTER — Ambulatory Visit (INDEPENDENT_AMBULATORY_CARE_PROVIDER_SITE_OTHER): Payer: Medicare Other | Admitting: Family Medicine

## 2013-05-28 ENCOUNTER — Encounter: Payer: Self-pay | Admitting: Family Medicine

## 2013-05-28 VITALS — BP 122/60 | HR 61 | Temp 98.0°F | Wt 206.2 lb

## 2013-05-28 DIAGNOSIS — S8011XA Contusion of right lower leg, initial encounter: Secondary | ICD-10-CM

## 2013-05-28 DIAGNOSIS — T148XXA Other injury of unspecified body region, initial encounter: Secondary | ICD-10-CM | POA: Diagnosis not present

## 2013-05-28 DIAGNOSIS — S8010XA Contusion of unspecified lower leg, initial encounter: Secondary | ICD-10-CM

## 2013-05-28 DIAGNOSIS — Z23 Encounter for immunization: Secondary | ICD-10-CM

## 2013-05-28 NOTE — Addendum Note (Signed)
Addended by: Arnette Norris on: 05/28/2013 01:49 PM   Modules accepted: Orders

## 2013-05-28 NOTE — Assessment & Plan Note (Addendum)
Warm compresses Call or rto prn No evidence of dvt

## 2013-05-28 NOTE — Progress Notes (Signed)
  Subjective:    Patient ID: Tyrone Diaz, male    DOB: 1941-07-18, 72 y.o.   MRN: [id number]  HPI Pt here c/o hematoma L calf.  Pt had fallen off a ladder over a week ago.  He had multiple abrasions and bruising on R knee.   Most have resolved.  Pt only complaint now is hematoma on L calf.  No calf pain.  No other symptoms.      Review of Systems    as above Objective:   Physical Exam  BP 122/60  Pulse 61  Temp(Src) 98 F (36.7 C) (Oral)  Wt 206 lb 3.2 oz (93.532 kg)  BMI 29.39 kg/m2  SpO2 96% General appearance: alert, cooperative, appears stated age and no distress Extremities: + hematoma L calf--no calf pain, no errythema or swelling      Assessment & Plan:

## 2013-05-28 NOTE — Patient Instructions (Signed)
Hematoma A hematoma is a pocket of blood that collects under the skin, in an organ, in a body space, in a joint space, or in other tissue. The blood can clot to form a lump that you can see and feel. The lump is often firm, sore, and sometimes even painful and tender. Most hematomas get better in a few days to weeks. However, some hematomas may be serious and require medical care.Hematomas can range in size from very small to very large. CAUSES  A hematoma can be caused by a blunt or penetrating injury. It can also be caused by leakage from a blood vessel under the skin. Spontaneous leakage from a blood vessel is more likely to occur in elderly people, especially those taking blood thinners. Sometimes, a hematoma can develop after certain medical procedures. SYMPTOMS  Unlike a bruise, a hematoma forms a firm lump that you can feel. This lump is the collection of blood. The collection of blood can also cause your skin to turn a blue to dark blue color. If the hematoma is close to the surface of the skin, it often produces a yellowish color in the skin. DIAGNOSIS  Your caregiver can determine whether you have a hematoma based on your history and a physical exam. TREATMENT  Hematomas usually go away on their own over time. Rarely does the blood need to be drained out of the body. HOME CARE INSTRUCTIONS   Put ice on the injured area.  Put ice in a plastic bag.  Place a towel between your skin and the bag.  Leave the ice on for 15-20 minutes, 3-4 times a day for the first 1 to 2 days.  After the first 2 days, switch to using warm compresses on the hematoma.  Elevate the injured area to help decrease pain and swelling. Wrapping the area with an elastic bandage may also be helpful. Compression helps to reduce swelling and promotes shrinking of the hematoma. Make sure the bandage is not wrapped too tight.  If your hematoma is on a lower extremity and is painful, crutches may be helpful for a couple  days.  Only take over-the-counter or prescription medicines for pain, discomfort, or fever as directed by your caregiver. Most patients can take acetaminophen or ibuprofen for the pain. SEEK IMMEDIATE MEDICAL CARE IF:   You have increasing pain, or your pain is not controlled with medicine.  You have a fever.  You have worsening swelling or discoloration.  Your skin over the hematoma breaks or starts bleeding. MAKE SURE YOU:   Understand these instructions.  Will watch your condition.  Will get help right away if you are not doing well or get worse. Document Released: 04/11/2004 Document Revised: 11/20/2011 Document Reviewed: 05/01/2011 [employer] Patient Information 2014 [employer].  

## 2013-08-28 ENCOUNTER — Telehealth: Payer: Self-pay

## 2013-08-28 NOTE — Telephone Encounter (Signed)
Medication List and allergies:  Reviewed and updated  90 day supply/mail order: na Local prescriptions: [facility] [address]  Immunizations due: UTD  A/P:   No changes to FH or PSH or personal hx Tdap--01/2006 PNA--04/2010 Shingles--04/2010 CCS--2002--[place]--polyps PSA--04/2011--070  To Discuss with Provider: Referral to Dr Arlyce Dice

## 2013-08-29 ENCOUNTER — Ambulatory Visit (INDEPENDENT_AMBULATORY_CARE_PROVIDER_SITE_OTHER): Payer: Medicare Other | Admitting: Internal Medicine

## 2013-08-29 ENCOUNTER — Encounter: Payer: Self-pay | Admitting: Internal Medicine

## 2013-08-29 VITALS — BP 123/68 | HR 60 | Temp 98.0°F | Ht 69.9 in | Wt 211.0 lb

## 2013-08-29 DIAGNOSIS — Z0000 Encounter for general adult medical examination without abnormal findings: Secondary | ICD-10-CM | POA: Diagnosis not present

## 2013-08-29 DIAGNOSIS — R739 Hyperglycemia, unspecified: Secondary | ICD-10-CM

## 2013-08-29 DIAGNOSIS — E559 Vitamin D deficiency, unspecified: Secondary | ICD-10-CM

## 2013-08-29 DIAGNOSIS — K6289 Other specified diseases of anus and rectum: Secondary | ICD-10-CM

## 2013-08-29 DIAGNOSIS — E785 Hyperlipidemia, unspecified: Secondary | ICD-10-CM

## 2013-08-29 DIAGNOSIS — F411 Generalized anxiety disorder: Secondary | ICD-10-CM

## 2013-08-29 DIAGNOSIS — E291 Testicular hypofunction: Secondary | ICD-10-CM

## 2013-08-29 DIAGNOSIS — Z79899 Other long term (current) drug therapy: Secondary | ICD-10-CM | POA: Diagnosis not present

## 2013-08-29 MED ORDER — PANTOPRAZOLE SODIUM 40 MG PO TBEC: 40.0000 mg | DELAYED_RELEASE_TABLET | Freq: Every day | ORAL | Status: DC

## 2013-08-29 MED ORDER — HYDROCORTISONE 2.5 % RE CREA: 1.0000 "application " | TOPICAL_CREAM | RECTAL | Status: DC | PRN

## 2013-08-29 MED ORDER — DIAZEPAM 5 MG PO TABS: 5.0000 mg | ORAL_TABLET | Freq: Three times a day (TID) | ORAL | Status: DC

## 2013-08-29 NOTE — Assessment & Plan Note (Signed)
Takes Valium very rarely. Will get a contract and a UDS

## 2013-08-29 NOTE — Progress Notes (Signed)
   Subjective:    Patient ID: Tyrone Diaz, male    DOB: 11-20-1940, 72 y.o.   MRN: [id number]  HPI Here for Medicare AWV:  1. Risk factors based on Past M, S, F history: reviewed 2. Physical Activities: treadmill daily, yard work   3. Depression/mood: neg screen 4. Hearing: high freq hearing loss, h/o exposure to noise, no need for hearing aids 5. ADL's:  independent 6. Fall Risk: did have a fall,  see instructions 7. home Safety: does feelsafe at home   8. Height, weight, &visual acuity: see VS, vision ok w/ glasses   9. Counseling: provided 10. Labs ordered based on risk factors: if needed   11. Referral Coordination: if needed 12.  Care Plan, see assessment and plan   13.   Cognitive Assessment: motor skills and cognition wnl  In addition, today we discussed the following: Glaucoma, good compliance with medications Anxiety, takes Valium as needed GERD, symptoms well controlled with PPIs as needed. History of a food allergy with anal itching, needs a refill on Anusol.  Past Medical History:   Hypogonadism   IBS   Glaucoma, Sees ophthalmology   Anxiety, valium prn   Spinal stenosis, on hydrocodone , Rx by pain management Dr Arvil Persons   H/o goiter, Bx remotely ~ 1997 GERD   Past Surgical History:   tonsilectomy  Family History:   Prostate cancer -- no   Colon Cancer--no   Diabetes-- no   CAD-- F MI at age 75   Stroke-- M side   Mom 2 y/o     Social History:   Married, 3 boys   Tobacco-- quit 1979   Alcohol Use - socially         Review of Systems Diet-- healthy for the most part No  CP, SOB, lower extremity edema denies  nausea, vomiting diarrhea Denies  blood in the stools No dysuria, gross hematuria, difficulty urinating         Objective:   Physical Exam  BP 123/68  Pulse 60  Temp(Src) 98 F (36.7 C)  Ht 5' 9.9" (1.775 m)  Wt 211 lb (95.709 kg)  BMI 30.38 kg/m2  SpO2 97% General -- alert, well-developed, NAD.  Neck --no thyromegaly Lungs  -- normal respiratory effort, no intercostal retractions, no accessory muscle use, and normal breath sounds.  Heart-- normal rate, regular rhythm, no murmur.  Abdomen-- Not distended, good bowel sounds,soft, non-tender. Rectal--  Normal sphincter tone. No rectal masses or tenderness. No  Stool found  Prostate--Prostate gland firm and smooth, no enlargement, nodularity, tenderness, mass, asymmetry or induration. Extremities-- no pretibial edema bilaterally  Neurologic--  alert & oriented X3. Speech normal, gait normal, strength normal in all extremities.  Psych-- Cognition and judgment appear intact. Cooperative with normal attention span and concentration. No anxious appearing , no depressed appearing.      Assessment & Plan:

## 2013-08-29 NOTE — Progress Notes (Signed)
Pre visit review using our clinic review tool, if applicable. No additional management support is needed unless otherwise documented below in the visit note. 

## 2013-08-29 NOTE — Assessment & Plan Note (Signed)
Testosterone normal the last time we checked

## 2013-08-29 NOTE — Assessment & Plan Note (Signed)
History of low vitamin D, recommend supplements, labs

## 2013-08-29 NOTE — Assessment & Plan Note (Addendum)
History of anal pruritus felt to be due to to allergies -per pt-  request a prescription for Anusol

## 2013-08-29 NOTE — Assessment & Plan Note (Signed)
Labs

## 2013-08-29 NOTE — Patient Instructions (Addendum)
Please go to the lab today and get a UDS  Schedule labs for next week, fasting: A1c-- dx  hyperglycemia Vitamin D--- dx  low vitamin D FLP--- dx  hyperlipidemia CBC --- dx screen for anemia PSA  Next visit for a physical exam  fasting, in 1 year Please make an appointment    Fall Prevention and Home Safety Falls cause injuries and can affect all age groups. It is possible to use preventive measures to significantly decrease the likelihood of falls. There are many simple measures which can make your home safer and prevent falls. OUTDOORS  Repair cracks and edges of walkways and driveways.  Remove high doorway thresholds.  Trim shrubbery on the main path into your home.  Have good outside lighting.  Clear walkways of tools, rocks, debris, and clutter.  Check that handrails are not broken and are securely fastened. Both sides of steps should have handrails.  Have leaves, snow, and ice cleared regularly.  Use sand or salt on walkways during winter months.  In the garage, clean up grease or oil spills. BATHROOM  Install night lights.  Install grab bars by the toilet and in the tub and shower.  Use non-skid mats or decals in the tub or shower.  Place a plastic non-slip stool in the shower to sit on, if needed.  Keep floors dry and clean up all water on the floor immediately.  Remove soap buildup in the tub or shower on a regular basis.  Secure bath mats with non-slip, double-sided rug tape.  Remove throw rugs and tripping hazards from the floors. BEDROOMS  Install night lights.  Make sure a bedside light is easy to reach.  Do not use oversized bedding.  Keep a telephone by your bedside.  Have a firm chair with side arms to use for getting dressed.  Remove throw rugs and tripping hazards from the floor. KITCHEN  Keep handles on pots and pans turned toward the center of the stove. Use back burners when possible.  Clean up spills quickly and allow time for  drying.  Avoid walking on wet floors.  Avoid hot utensils and knives.  Position shelves so they are not too high or low.  Place commonly used objects within easy reach.  If necessary, use a sturdy step stool with a grab bar when reaching.  Keep electrical cables out of the way.  Do not use floor polish or wax that makes floors slippery. If you must use wax, use non-skid floor wax.  Remove throw rugs and tripping hazards from the floor. STAIRWAYS  Never leave objects on stairs.  Place handrails on both sides of stairways and use them. Fix any loose handrails. Make sure handrails on both sides of the stairways are as long as the stairs.  Check carpeting to make sure it is firmly attached along stairs. Make repairs to worn or loose carpet promptly.  Avoid placing throw rugs at the top or bottom of stairways, or properly secure the rug with carpet tape to prevent slippage. Get rid of throw rugs, if possible.  Have an electrician put in a light switch at the top and bottom of the stairs. OTHER FALL PREVENTION TIPS  Wear low-heel or rubber-soled shoes that are supportive and fit well. Wear closed toe shoes.  When using a stepladder, make sure it is fully opened and both spreaders are firmly locked. Do not climb a closed stepladder.  Add color or contrast paint or tape to grab bars and handrails in  your home. Place contrasting color strips on first and last steps.  Learn and use mobility aids as needed. Install an electrical emergency response system.  Turn on lights to avoid dark areas. Replace light bulbs that burn out immediately. Get light switches that glow.  Arrange furniture to create clear pathways. Keep furniture in the same place.  Firmly attach carpet with non-skid or double-sided tape.  Eliminate uneven floor surfaces.  Select a carpet pattern that does not visually hide the edge of steps.  Be aware of all pets. OTHER HOME SAFETY TIPS  Set the water temperature  for 120 F (48.8 C).  Keep emergency numbers on or near the telephone.  Keep smoke detectors on every level of the home and near sleeping areas. Document Released: 08/18/2002 Document Revised: 02/27/2012 Document Reviewed: 11/17/2011 [facility] Patient Information 2014 [place].

## 2013-08-29 NOTE — Assessment & Plan Note (Addendum)
Td 07 zostavax and pneumonia shot 2011 Had a flu shot today  Cscope 2002, due for one, refer to Dr Arlyce Dice DRE normal today, check a PSA  Continue with his healthy lifestyle.

## 2013-09-01 ENCOUNTER — Encounter: Payer: Self-pay | Admitting: Gastroenterology

## 2013-09-08 DIAGNOSIS — G894 Chronic pain syndrome: Secondary | ICD-10-CM | POA: Diagnosis not present

## 2013-09-08 DIAGNOSIS — M1610 Unilateral primary osteoarthritis, unspecified hip: Secondary | ICD-10-CM | POA: Diagnosis not present

## 2013-09-08 DIAGNOSIS — IMO0002 Reserved for concepts with insufficient information to code with codable children: Secondary | ICD-10-CM | POA: Diagnosis not present

## 2013-09-08 DIAGNOSIS — M48061 Spinal stenosis, lumbar region without neurogenic claudication: Secondary | ICD-10-CM | POA: Diagnosis not present

## 2013-09-23 ENCOUNTER — Telehealth: Payer: Self-pay

## 2013-09-23 NOTE — Telephone Encounter (Signed)
UDS: 09/01/2013 Positive Low risk--per Dr Larose Kells

## 2013-10-09 ENCOUNTER — Ambulatory Visit (AMBULATORY_SURGERY_CENTER): Payer: Self-pay

## 2013-10-09 ENCOUNTER — Telehealth: Payer: Self-pay | Admitting: Gastroenterology

## 2013-10-09 VITALS — Ht 70.5 in | Wt 207.0 lb

## 2013-10-09 DIAGNOSIS — Z860100 Personal history of colon polyps, unspecified: Secondary | ICD-10-CM

## 2013-10-09 MED ORDER — SUPREP BOWEL PREP KIT 17.5-3.13-1.6 GM/177ML PO SOLN: 1.0000 | Freq: Once | ORAL | Status: DC

## 2013-10-09 NOTE — Telephone Encounter (Signed)
Pt given the number [phone] to call for [facility] to discuss virtual colonoscopy

## 2013-10-09 NOTE — Progress Notes (Signed)
After previsit pt felt he'd rather have a virtual colonoscopy instead of a colonoscopy because his wife is concerned about potential bowel perforation.  Explained to pt that this was a minute risk (about 1 out of every 260,000) but he still felt comfortable with his decision eventhough he has a history of polyps.  Referred him to scheduler ext [phone] on 3rd floor(attempted to reach her by phone but she was busy with other callers).  Pt plans to pursue virtual colonoscopy at this time and will reschedule with [place] if he changes his mind.

## 2013-10-23 ENCOUNTER — Encounter: Payer: Medicare Other | Admitting: Gastroenterology

## 2013-12-23 DIAGNOSIS — M545 Low back pain, unspecified: Secondary | ICD-10-CM | POA: Diagnosis not present

## 2013-12-23 DIAGNOSIS — M5137 Other intervertebral disc degeneration, lumbosacral region: Secondary | ICD-10-CM | POA: Diagnosis not present

## 2013-12-30 DIAGNOSIS — M1610 Unilateral primary osteoarthritis, unspecified hip: Secondary | ICD-10-CM | POA: Diagnosis not present

## 2013-12-30 DIAGNOSIS — G894 Chronic pain syndrome: Secondary | ICD-10-CM | POA: Diagnosis not present

## 2013-12-30 DIAGNOSIS — M48061 Spinal stenosis, lumbar region without neurogenic claudication: Secondary | ICD-10-CM | POA: Diagnosis not present

## 2014-01-23 IMAGING — CR DG FOOT COMPLETE 3+V*L*
3 series · 3 of 3 positions shown · non-contrast
Comparison: None.

CLINICAL DATA: Left foot pain.  History of fifth metatarsal
fracture

LEFT FOOT - COMPLETE 3+ VIEW

[t foot ap left]
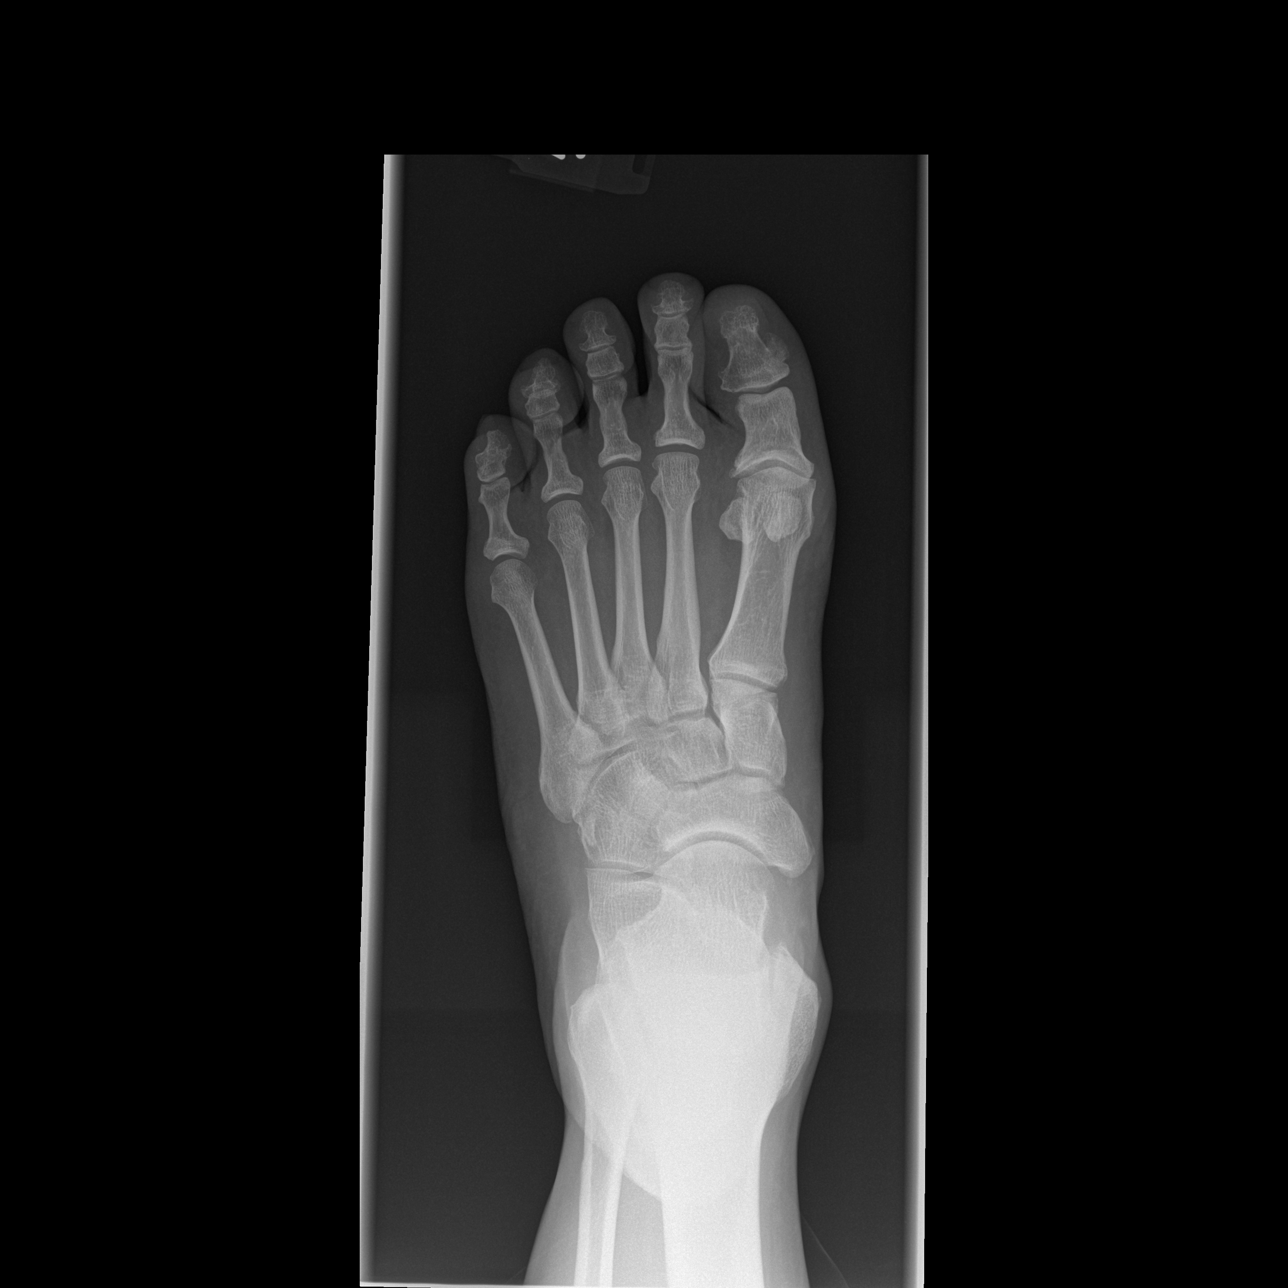

[t foot oblique left]
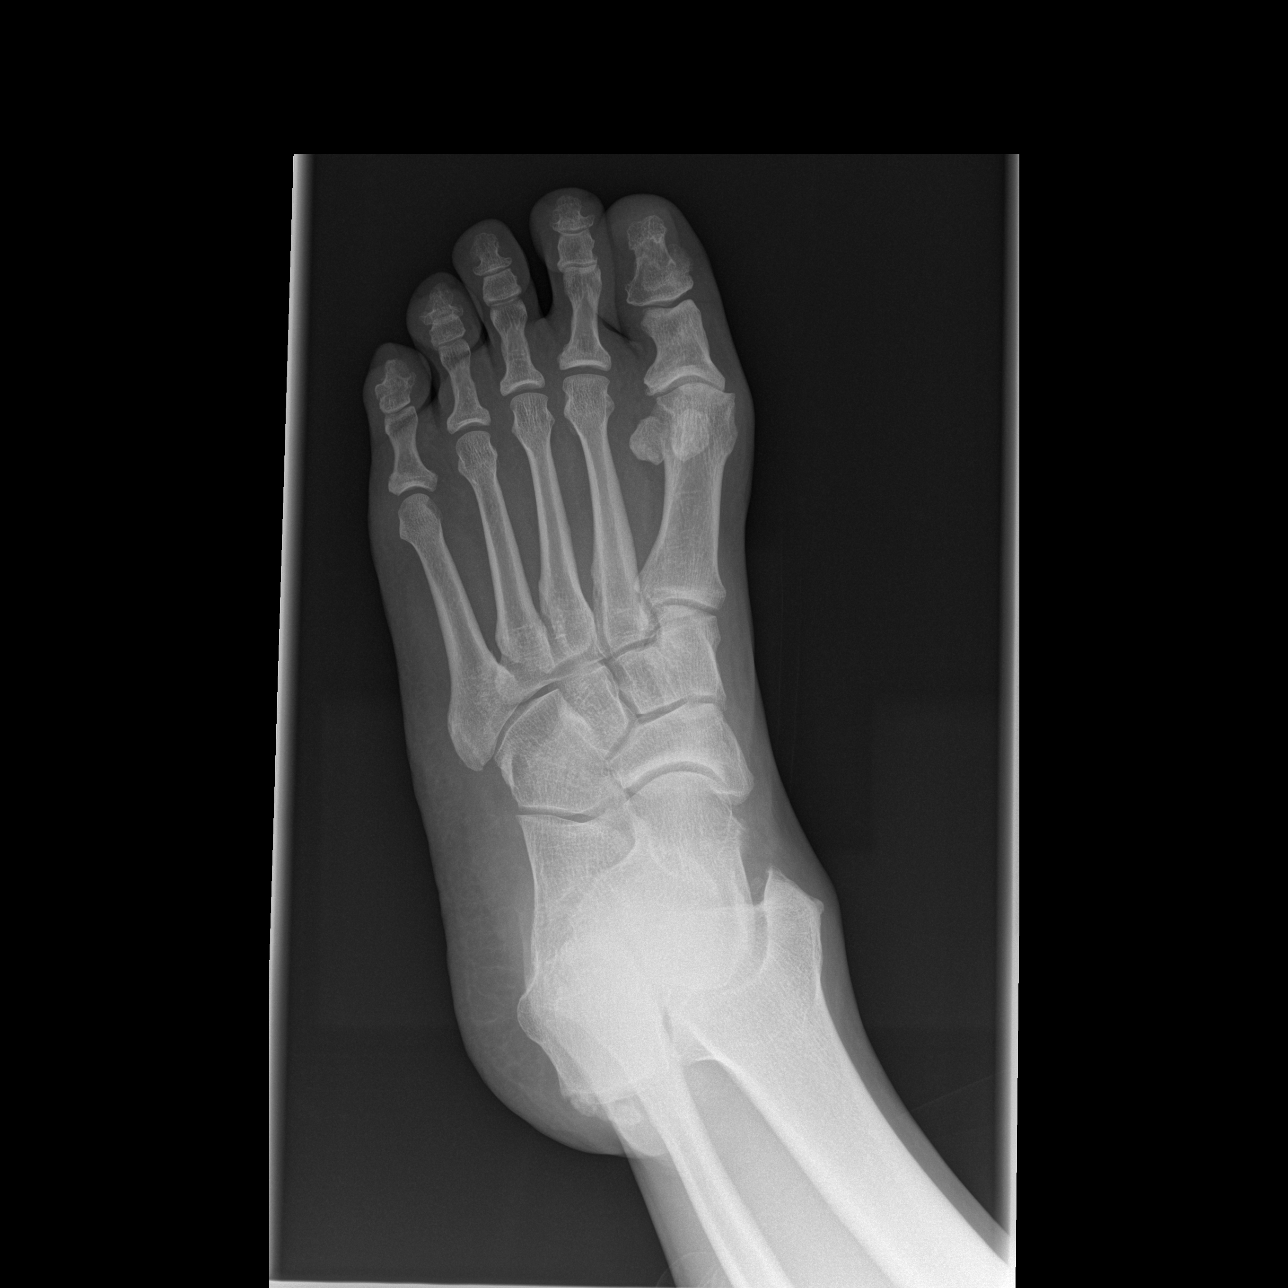

[t foot lat left]
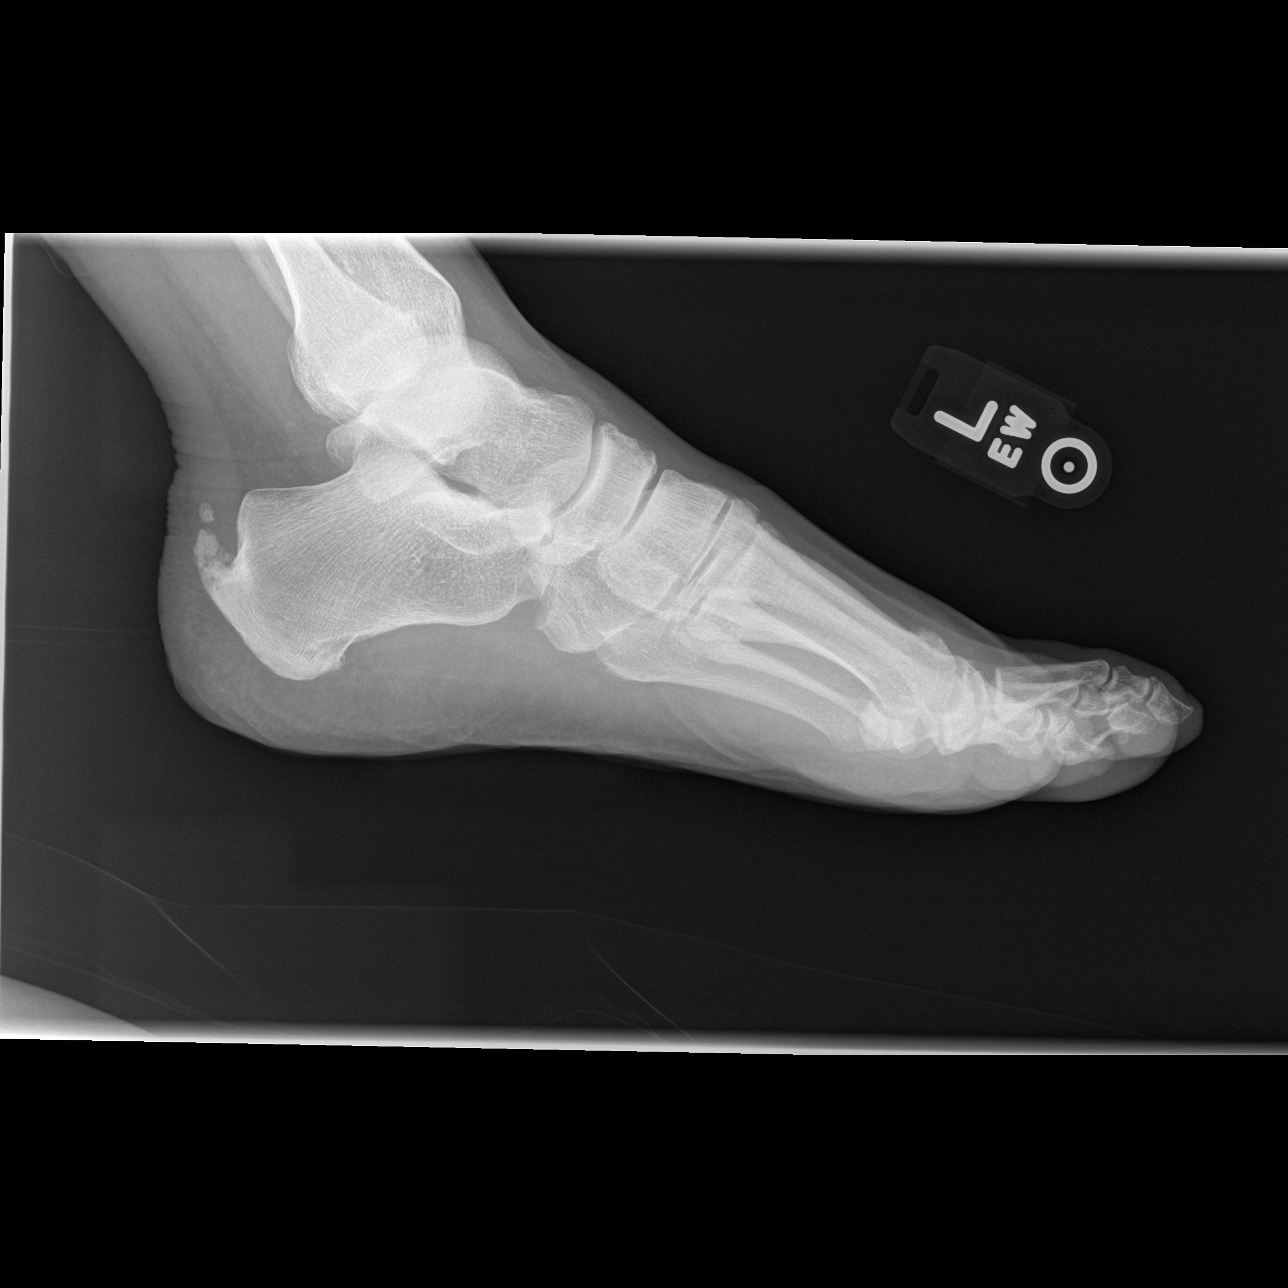

[3 of 3 positions shown; findings below may reference images not displayed]

FINDINGS: Negative for acute fracture.  Fifth metatarsal fracture
has healed without radiographic abnormality.

Degenerative change in the first metatarsal phalangeal joint with
joint space narrowing and spurring.

There is calcaneal spurring particularly at the Achilles tendon
insertion.
IMPRESSION: No acute fracture.

Degenerative changes in the first metatarsal phalangeal joint.
Calcaneal spurring.

## 2014-01-29 DIAGNOSIS — M48062 Spinal stenosis, lumbar region with neurogenic claudication: Secondary | ICD-10-CM | POA: Diagnosis not present

## 2014-02-23 DIAGNOSIS — M1610 Unilateral primary osteoarthritis, unspecified hip: Secondary | ICD-10-CM | POA: Diagnosis not present

## 2014-02-23 DIAGNOSIS — M48061 Spinal stenosis, lumbar region without neurogenic claudication: Secondary | ICD-10-CM | POA: Diagnosis not present

## 2014-02-23 DIAGNOSIS — G894 Chronic pain syndrome: Secondary | ICD-10-CM | POA: Diagnosis not present

## 2014-03-09 DIAGNOSIS — H409 Unspecified glaucoma: Secondary | ICD-10-CM | POA: Diagnosis not present

## 2014-03-09 DIAGNOSIS — H524 Presbyopia: Secondary | ICD-10-CM | POA: Diagnosis not present

## 2014-03-09 DIAGNOSIS — H2510 Age-related nuclear cataract, unspecified eye: Secondary | ICD-10-CM | POA: Diagnosis not present

## 2014-03-09 DIAGNOSIS — H4011X0 Primary open-angle glaucoma, stage unspecified: Secondary | ICD-10-CM | POA: Diagnosis not present

## 2014-05-13 DIAGNOSIS — G4700 Insomnia, unspecified: Secondary | ICD-10-CM | POA: Diagnosis not present

## 2014-05-13 DIAGNOSIS — G894 Chronic pain syndrome: Secondary | ICD-10-CM | POA: Diagnosis not present

## 2014-05-13 DIAGNOSIS — M1610 Unilateral primary osteoarthritis, unspecified hip: Secondary | ICD-10-CM | POA: Diagnosis not present

## 2014-05-13 DIAGNOSIS — M48061 Spinal stenosis, lumbar region without neurogenic claudication: Secondary | ICD-10-CM | POA: Diagnosis not present

## 2014-05-19 DIAGNOSIS — M48061 Spinal stenosis, lumbar region without neurogenic claudication: Secondary | ICD-10-CM | POA: Diagnosis not present

## 2014-06-26 ENCOUNTER — Encounter: Payer: Self-pay | Admitting: *Deleted

## 2014-07-02 ENCOUNTER — Encounter: Payer: Self-pay | Admitting: Cardiology

## 2014-07-02 ENCOUNTER — Ambulatory Visit (INDEPENDENT_AMBULATORY_CARE_PROVIDER_SITE_OTHER): Payer: Medicare Other | Admitting: Cardiology

## 2014-07-02 VITALS — BP 128/80 | HR 57 | Ht 70.5 in | Wt 209.2 lb

## 2014-07-02 DIAGNOSIS — I82401 Acute embolism and thrombosis of unspecified deep veins of right lower extremity: Secondary | ICD-10-CM | POA: Diagnosis not present

## 2014-07-02 DIAGNOSIS — R002 Palpitations: Secondary | ICD-10-CM | POA: Insufficient documentation

## 2014-07-02 DIAGNOSIS — R600 Localized edema: Secondary | ICD-10-CM | POA: Insufficient documentation

## 2014-07-02 DIAGNOSIS — E785 Hyperlipidemia, unspecified: Secondary | ICD-10-CM | POA: Diagnosis not present

## 2014-07-02 NOTE — Assessment & Plan Note (Signed)
Followed by PCP

## 2014-07-02 NOTE — Progress Notes (Signed)
07/02/2014 Tyrone Diaz   [birth year]  [id number]  [place], [place] Primary Cardiologist: Was Dr Rollene Fare ([place] 2008)  HPI:  73 y/o male seen by Dr Rollene Fare in 2003 for palpitations. He had an unremarkable echo and nuclear stress in 2003 and 2008. His symptoms resolved off caffeine. He is here now for evaluation of Rt calf edema and vein distention. The pt says he fell off a ldder in April of this year injuring his Rt calf- "I had a hematoma". This apparently worsened a month or so later after playing tennis. He has noticed distended veins in his Rt calf since then. He denies any hemoptysis or unusual dyspnea or chest pain. He says he has a family history of stroke and was worried a blood clot might cause a stroke.     Current Outpatient Prescriptions  Medication Sig Dispense Refill  . aspirin 81 MG tablet Take 81 mg by mouth daily.        . diazepam (VALIUM) 5 MG tablet Take 1 tablet (5 mg total) by mouth 3 (three) times daily. Prn anxoet  270 tablet  0  . fluticasone (FLONASE) 50 MCG/ACT nasal spray Place 2 sprays into the nose daily.  16 g  6  . HYDROcodone-acetaminophen (VICODIN) 5-500 MG per tablet Take 1 tablet by mouth every 6 (six) hours as needed.  60 tablet  2  . hydrocortisone (ANUSOL-HC) 2.5 % rectal cream Place 1 application rectally as needed for hemorrhoids or itching.  30 g  0  . latanoprost (XALATAN) 0.005 % ophthalmic solution Place 1 drop into both eyes at bedtime.        Manus Gunning BOWEL PREP SOLN Take 1 kit by mouth once.  354 mL  0  . timolol (TIMOPTIC) 0.5 % ophthalmic solution        No current facility-administered medications for this visit.    Allergies  Allergen Reactions  . Peanut-Containing Drug Products     Gastrointestinal symptoms    History   Social History  . Marital Status: Married    Spouse Name: Tyrone Diaz    Number of Children: N/A  . Years of Education: Masters   Occupational History  .     Social History Main  Topics  . Smoking status: Former Research scientist (life sciences)  . Smokeless tobacco: Never Used  . Alcohol Use: Yes     Comment: occ.  . Drug Use: No  . Sexual Activity: Not on file   Other Topics Concern  . Not on file   Social History Narrative  . No narrative on file     Review of Systems: General: negative for chills, fever, night sweats or weight changes.  Cardiovascular: negative for chest pain, dyspnea on exertion, edema, orthopnea, palpitations, paroxysmal nocturnal dyspnea or shortness of breath Dermatological: negative for rash Respiratory: negative for cough or wheezing Urologic: negative for hematuria Abdominal: negative for nausea, vomiting, diarrhea, bright red blood per rectum, melena, or hematemesis Neurologic: negative for visual changes, syncope, or dizziness All other systems reviewed and are otherwise negative except as noted above.    Blood pressure 128/80, pulse 57, height 5' 10.5" (1.791 m), weight 209 lb 3.2 oz (94.892 kg).  General appearance: alert, cooperative, no distress and mildly obese Neck: no carotid bruit and no JVD Lungs: clear to auscultation bilaterally Heart: regular rate and rhythm and no obvious murmur Abdomen: soft, non-tender; bowel sounds normal; no masses,  no organomegaly Extremities: No edema. He does have mild Rt calf  tenderness to deep palpation. I did notice some venous distention, varicosites, Rt > Ly Pulses: 2+ and symmetric Skin: Skin color, texture, turgor normal. No rashes or lesions Neurologic: Grossly normal  EKG NSR, SB  ASSESSMENT AND PLAN:   Edema leg R/O DVT  Dyslipidemia Followed by PCP  Palpitations Negative Nuclear stress test March 2009, unremarkable echo 2008. Symptoms resolved once caffeine discontinued.    PLAN  I reassured Mr Loconte that he was unlikely to have a stroke from a hematoma or venous clot in his Rt calf. I am concerned he may have a DVT though. Both techs are at the hospital for an inservice today so a  doopler will be scheduled ASAP. If this is negative for DVT we can see him on a PRN basis.   Naiomi Musto KPA-C 07/02/2014 1:55 PM

## 2014-07-02 NOTE — Patient Instructions (Signed)
Your physician recommends that you schedule a follow-up appointment in: As Needed  Your physician has requested that you have a lower extremity venous duplex. This test is an ultrasound of the veins in the legs. It looks at venous blood flow that carries blood from the heart to the legs. Allow one hour for a Lower Venous exam. Allow thirty minutes for an Upper Venous exam. There are no restrictions or special instructions.

## 2014-07-02 NOTE — Assessment & Plan Note (Signed)
Negative Nuclear stress test March 2009, unremarkable echo 2008. Symptoms resolved once caffeine discontinued.

## 2014-07-02 NOTE — Assessment & Plan Note (Signed)
R/O DVT

## 2014-07-03 ENCOUNTER — Ambulatory Visit (HOSPITAL_COMMUNITY)
Admission: RE | Admit: 2014-07-03 | Discharge: 2014-07-03 | Disposition: A | Discharge status: Home / Self Care | Payer: Medicare Other | Source: Ambulatory Visit | Attending: Cardiovascular Disease | Admitting: Cardiovascular Disease

## 2014-07-03 DIAGNOSIS — I82401 Acute embolism and thrombosis of unspecified deep veins of right lower extremity: Secondary | ICD-10-CM

## 2014-07-03 DIAGNOSIS — M79661 Pain in right lower leg: Secondary | ICD-10-CM | POA: Diagnosis not present

## 2014-07-03 DIAGNOSIS — M7989 Other specified soft tissue disorders: Secondary | ICD-10-CM | POA: Diagnosis not present

## 2014-07-03 NOTE — Progress Notes (Signed)
Right Lower Ext. Venous Duplex Completed. Negative for DVT or SVT. Soriah Leeman, BS, RDMS, RVT  

## 2014-07-30 DIAGNOSIS — G894 Chronic pain syndrome: Secondary | ICD-10-CM | POA: Diagnosis not present

## 2014-07-30 DIAGNOSIS — M47812 Spondylosis without myelopathy or radiculopathy, cervical region: Secondary | ICD-10-CM | POA: Diagnosis not present

## 2014-07-30 DIAGNOSIS — M4807 Spinal stenosis, lumbosacral region: Secondary | ICD-10-CM | POA: Diagnosis not present

## 2014-07-30 DIAGNOSIS — G4700 Insomnia, unspecified: Secondary | ICD-10-CM | POA: Diagnosis not present

## 2014-08-11 ENCOUNTER — Other Ambulatory Visit: Payer: Self-pay

## 2014-10-13 ENCOUNTER — Encounter: Payer: Self-pay | Admitting: Internal Medicine

## 2014-10-13 ENCOUNTER — Ambulatory Visit (INDEPENDENT_AMBULATORY_CARE_PROVIDER_SITE_OTHER): Payer: Medicare Other | Admitting: Internal Medicine

## 2014-10-13 VITALS — BP 139/74 | HR 61 | Temp 97.9°F | Ht 71.0 in | Wt 215.4 lb

## 2014-10-13 DIAGNOSIS — E785 Hyperlipidemia, unspecified: Secondary | ICD-10-CM | POA: Diagnosis not present

## 2014-10-13 DIAGNOSIS — Z23 Encounter for immunization: Secondary | ICD-10-CM | POA: Diagnosis not present

## 2014-10-13 DIAGNOSIS — E559 Vitamin D deficiency, unspecified: Secondary | ICD-10-CM | POA: Diagnosis not present

## 2014-10-13 DIAGNOSIS — F411 Generalized anxiety disorder: Secondary | ICD-10-CM | POA: Diagnosis not present

## 2014-10-13 DIAGNOSIS — E049 Nontoxic goiter, unspecified: Secondary | ICD-10-CM | POA: Diagnosis not present

## 2014-10-13 DIAGNOSIS — Z125 Encounter for screening for malignant neoplasm of prostate: Secondary | ICD-10-CM

## 2014-10-13 DIAGNOSIS — Z0000 Encounter for general adult medical examination without abnormal findings: Secondary | ICD-10-CM

## 2014-10-13 DIAGNOSIS — R7309 Other abnormal glucose: Secondary | ICD-10-CM

## 2014-10-13 DIAGNOSIS — R739 Hyperglycemia, unspecified: Secondary | ICD-10-CM | POA: Insufficient documentation

## 2014-10-13 DIAGNOSIS — Z130 Encounter for screening for diseases of the blood and blood-forming organs and certain disorders involving the immune mechanism: Secondary | ICD-10-CM

## 2014-10-13 DIAGNOSIS — K219 Gastro-esophageal reflux disease without esophagitis: Secondary | ICD-10-CM | POA: Insufficient documentation

## 2014-10-13 DIAGNOSIS — R7303 Prediabetes: Secondary | ICD-10-CM

## 2014-10-13 DIAGNOSIS — M4800 Spinal stenosis, site unspecified: Secondary | ICD-10-CM

## 2014-10-13 MED ORDER — PANTOPRAZOLE SODIUM 40 MG PO TBEC: 40.0000 mg | DELAYED_RELEASE_TABLET | Freq: Every day | ORAL | Status: DC

## 2014-10-13 MED ORDER — EPINEPHRINE 0.3 MG/0.3ML IJ SOAJ: 0.3000 mg | Freq: Once | INTRAMUSCULAR | Status: DC

## 2014-10-13 NOTE — Assessment & Plan Note (Signed)
Exam is unremarkable today, check labs

## 2014-10-13 NOTE — Assessment & Plan Note (Signed)
On Valium as needed, due for a UDS

## 2014-10-13 NOTE — Assessment & Plan Note (Signed)
On diet control, labs

## 2014-10-13 NOTE — Progress Notes (Signed)
Pre visit review using our clinic review tool, if applicable. No additional management support is needed unless otherwise documented below in the visit note. 

## 2014-10-13 NOTE — Progress Notes (Signed)
Subjective:    Patient ID: Tyrone Diaz, male    DOB: 01-Apr-1941, 74 y.o.   MRN: [id number]  DOS:  10/13/2014 Type of visit - description :    Here for Medicare AWV:  1. Risk factors based on Past M, S, F history: reviewed 2. Physical Activities: treadmill daily, yard work  3. Depression/mood: neg screen 4. Hearing: high freq hearing loss, h/o exposure to noise, no need for hearing aids , problem not worse  5. ADL's: independent 6. Fall Risk: no recent fall, see instructions 7. home Safety: does feelsafe at home  8. Height, weight, &visual acuity: see VS, vision ok w/ glasses , sees eye doctor yearly, developing cataracts  9. Counseling: provided 10. Labs ordered based on risk factors: if needed  11. Referral Coordination: if needed 12. Care Plan, see assessment and plan  13. Cognitive Assessment: motor skills and cognition wnl 14. Care team updated . Dr Ace Gins pain mngt  In addition, today we discussed the following: Glaucoma, good compliance with medications Anxiety, takes Valium as needed History of allergies to wasps, has a  a EpiPen. Has chronic back pain, managed  by pain management   Review of Systems Denies chest pain or difficulty breathing No nausea, vomiting, diarrhea or blood in the stools No cough, sputum production or wheezing. No dysuria, gross hematuria difficulty urinating. Urine flow is slightly slower than few years ago.  Past Medical History  Diagnosis Date  . ANEMIA 04/28/2010  . ANXIETY 07/23/2008  . 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 GERD 04/28/2010  . GLAUCOMA, BILATERAL 07/23/2008  . HYPERLIPIDEMIA 04/28/2010  . HYPOTHYROIDISM 04/28/2010  . PROCTITIS 07/23/2008  . TESTICULAR HYPOFUNCTION 04/28/2010  . Unspecified vitamin D deficiency 04/28/2010  . 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 Hx of echocardiogram 12/2001    Had previously shown mild MR as well as mild TR with normal systolic function.  Marland Kitchen Hx of echocardiogram  11/2006    5-years follow-up, this showed normal systolic as well as diastolic function. There was suggestion of flat mitral valve leaflet coaptation with borderline anterior mitral valve leaflet prolapse with mild associated mitral valve prolapse. He had mild TR. There was very mild aortic valve sclerosis without stenosis or insufficiency.  . History of stress test 12/2001    Showed normal perfusion with mild apical thinning.  Marland Kitchen History of stress test 11/2007    Post stress myocardial perfusion images show a normal pattern of perfusion in the all regions, the post stress left ventricular is normal in size. There is no scintigraphic evidence of inducible myocardial ischemia. The post EF 64%    Past Surgical History  Procedure Laterality Date  . Tonsilectomy, adenoidectomy, bilateral myringotomy and tubes  1943  . Ear injury      piece of ear plug dislodged from ear  . Wisdom tooth extraction    . Dental surgery      History   Social History  . Marital Status: Married    Spouse Name: Thornton Papas    Number of Children: 3  . Years of Education: Masters   Occupational History  . retired 2010, English as a second language teacher    Social History Main Topics  . Smoking status: Former Research scientist (life sciences)  . Smokeless tobacco: Never Used     Comment: quit 1970  . Alcohol Use: 0.0 oz/week    0 Not specified per week     Comment: occ.  . Drug Use: No  . Sexual Activity: Not on file   Other Topics  Concern  . Not on file   Social History Narrative     Family History  Problem Relation Age of Onset  . Arthritis Mother   . Heart disease Mother     CHF  . Hypertension Mother   . Arthritis Father   . Stroke Father   . Hypertension Father   . Colon cancer Neg Hx   . Pancreatic cancer Neg Hx   . Stomach cancer Neg Hx   . Prostate cancer Neg Hx       Medication List       This list is accurate as of: 10/13/14 11:59 PM.  Always use your most recent med list.               aspirin 81 MG tablet  Take 81 mg  by mouth daily.     diazepam 5 MG tablet  Commonly known as:  VALIUM  Take 1 tablet (5 mg total) by mouth 3 (three) times daily. Prn anxoet     EPINEPHrine 0.3 mg/0.3 mL Soaj injection  Commonly known as:  EPI-PEN  Inject 0.3 mLs (0.3 mg total) into the muscle once.     HYDROcodone-acetaminophen 5-500 MG per tablet  Commonly known as:  VICODIN  Take 1 tablet by mouth every 6 (six) hours as needed.     hydrocortisone 2.5 % rectal cream  Commonly known as:  ANUSOL-HC  Place 1 application rectally as needed for hemorrhoids or itching.     latanoprost 0.005 % ophthalmic solution  Commonly known as:  XALATAN  Place 1 drop into both eyes at bedtime.     pantoprazole 40 MG tablet  Commonly known as:  PROTONIX  Take 1 tablet (40 mg total) by mouth daily.     timolol 0.5 % ophthalmic solution  Commonly known as:  TIMOPTIC           Objective:   Physical Exam  Constitutional: He is oriented to person, place, and time. He appears well-developed. No distress.  HENT:  Head: Normocephalic and atraumatic.  Neck: No thyromegaly present.  Cardiovascular:  RRR, no murmur, rub or gallop  Pulmonary/Chest: Effort normal. No respiratory distress.  CTA B  Abdominal: Soft. Bowel sounds are normal. He exhibits no distension and no mass. There is no tenderness. There is no rebound and no guarding.  Musculoskeletal: Normal range of motion. He exhibits no edema or tenderness.  Lymphadenopathy:    He has no cervical adenopathy.  Neurological: He is alert and oriented to person, place, and time. No cranial nerve deficit. He exhibits normal muscle tone. Coordination normal.  Speech normal, gait unassisted and normal for age, motor strength appropriate for age   Skin: Skin is warm and dry. No pallor.  Psychiatric: He has a normal mood and affect. His behavior is normal. Judgment and thought content normal.  Vitals reviewed.     Assessment & Plan:   Problem List Items Addressed This Visit     Vitamin D deficiency    On OTC vitamin D, check levels today      Relevant Orders   Vit D  25 hydroxy (rtn osteoporosis monitoring) (Completed)   Dyslipidemia    On diet control, labs      Relevant Orders   Lipid panel (Completed)   CBC with Differential/Platelet (Completed)   Anxiety state    On Valium as needed, due for a UDS      Spinal stenosis    Sees Dr. Ace Gins  for pain management  Annual physical exam    Td 07 zostavax  2011 pneumonia shot 2011 prevnar today Had a flu shot today  Cscope 2002 (2 benign polyps), due for a repeated scope but he is somehow reluctant to proceed. We discuss other options including Hemoccults, Cologuard, virtual  CT. He is interested on cologuard . I'll communicate with GI to see be sure is appropriate to screen him with this genetic testing given history of previous polyps.  Prostate cancer screening, DRE was normal 2014, will check a PSA   DRE normal today, check a PSA  Continue with his healthy lifestyle.           h/o goiter, remote Bx - Primary    Exam is unremarkable today, check labs      Relevant Orders   TSH (Completed)   Prediabetes    A1c 5.7 a couple of years ago, recheck A1c      Relevant Orders   Comprehensive metabolic panel (Completed)   Hemoglobin A1c (Completed)   GERD (gastroesophageal reflux disease)    RF protoinix      Relevant Medications   pantoprazole (PROTONIX) EC tablet    Other Visit Diagnoses    Prostate cancer screening        Relevant Orders    PSA (Completed)    Screening, anemia, deficiency, iron

## 2014-10-13 NOTE — Assessment & Plan Note (Addendum)
Td 07 zostavax  2011 pneumonia shot 2011 prevnar today Had a flu shot today  Cscope 2002 (2 benign polyps), due for a repeated scope but he is somehow reluctant to proceed. We discuss other options including Hemoccults, Cologuard, virtual  CT. He is interested on cologuard . Addendum, per GI,Cologuard is a good alternative for the patient : will let him know   Prostate cancer screening, DRE was normal 2014, will check a PSA  Continue with his healthy lifestyle.

## 2014-10-13 NOTE — Assessment & Plan Note (Signed)
Sees Dr. Ace Gins  for pain management

## 2014-10-13 NOTE — Assessment & Plan Note (Signed)
On OTC vitamin D, check levels today

## 2014-10-13 NOTE — Assessment & Plan Note (Signed)
RF protoinix

## 2014-10-13 NOTE — Patient Instructions (Signed)
Stop by the front desk and schedule labs to be done within few days (fasting)  Please come back to the office in 6 months  for a routine check up     Fall Prevention and [place] cause injuries and can affect all age groups. It is possible to use preventive measures to significantly decrease the likelihood of falls. There are many simple measures which can make your home safer and prevent falls. OUTDOORS  Repair cracks and edges of walkways and driveways.  Remove high doorway thresholds.  Trim shrubbery on the main path into your home.  Have good outside lighting.  Clear walkways of tools, rocks, debris, and clutter.  Check that handrails are not broken and are securely fastened. Both sides of steps should have handrails.  Have leaves, snow, and ice cleared regularly.  Use sand or salt on walkways during winter months.  In the garage, clean up grease or oil spills. BATHROOM  Install night lights.  Install grab bars by the toilet and in the tub and shower.  Use non-skid mats or decals in the tub or shower.  Place a plastic non-slip stool in the shower to sit on, if needed.  Keep floors dry and clean up all water on the floor immediately.  Remove soap buildup in the tub or shower on a regular basis.  Secure bath mats with non-slip, double-sided rug tape.  Remove throw rugs and tripping hazards from the floors. BEDROOMS  Install night lights.  Make sure a bedside light is easy to reach.  Do not use oversized bedding.  Keep a telephone by your bedside.  Have a firm chair with side arms to use for getting dressed.  Remove throw rugs and tripping hazards from the floor. KITCHEN  Keep handles on pots and pans turned toward the center of the stove. Use back burners when possible.  Clean up spills quickly and allow time for drying.  Avoid walking on wet floors.  Avoid hot utensils and knives.  Position shelves so they are not too high or  low.  Place commonly used objects within easy reach.  If necessary, use a sturdy step stool with a grab bar when reaching.  Keep electrical cables out of the way.  Do not use floor polish or wax that makes floors slippery. If you must use wax, use non-skid floor wax.  Remove throw rugs and tripping hazards from the floor. STAIRWAYS  Never leave objects on stairs.  Place handrails on both sides of stairways and use them. Fix any loose handrails. Make sure handrails on both sides of the stairways are as long as the stairs.  Check carpeting to make sure it is firmly attached along stairs. Make repairs to worn or loose carpet promptly.  Avoid placing throw rugs at the top or bottom of stairways, or properly secure the rug with carpet tape to prevent slippage. Get rid of throw rugs, if possible.  Have an electrician put in a light switch at the top and bottom of the stairs. OTHER FALL PREVENTION TIPS  Wear low-heel or rubber-soled shoes that are supportive and fit well. Wear closed toe shoes.  When using a stepladder, make sure it is fully opened and both spreaders are firmly locked. Do not climb a closed stepladder.  Add color or contrast paint or tape to grab bars and handrails in your home. Place contrasting color strips on first and last steps.  Learn and use mobility aids as needed. Install an [place] emergency  response system.  Turn on lights to avoid dark areas. Replace light bulbs that burn out immediately. Get light switches that glow.  Arrange furniture to create clear pathways. Keep furniture in the same place.  Firmly attach carpet with non-skid or double-sided tape.  Eliminate uneven floor surfaces.  Select a carpet pattern that does not visually hide the edge of steps.  Be aware of all pets. OTHER HOME SAFETY TIPS  Set the water temperature for 120 F (48.8 C).  Keep emergency numbers on or near the telephone.  Keep smoke detectors on every level of the  home and near sleeping areas. Document Released: 08/18/2002 Document Revised: 02/27/2012 Document Reviewed: 11/17/2011 [facility] Patient Information 2015 [place], [place]. This information is not intended to replace advice given to you by your health care provider. Make sure you discuss any questions you have with your health care provider.    Preventive Care for Adults   Ages 80 and over  Blood pressure check.** / Every 1 to 2 years.  Lipid and cholesterol check.**/ Every 5 years beginning at age 16.  Lung cancer screening. / Every year if you are aged 61-80 years and have a 30-pack-year history of smoking and currently smoke or have quit within the past 15 years. Yearly screening is stopped once you have quit smoking for at least 15 years or develop a health problem that would prevent you from having lung cancer treatment.  Fecal occult blood test (FOBT) of stool. / Every year beginning at age 69 and continuing until age 71. You may not have to do this test if you get a colonoscopy every 10 years.  Flexible sigmoidoscopy** or colonoscopy.** / Every 5 years for a flexible sigmoidoscopy or every 10 years for a colonoscopy beginning at age 1 and continuing until age 13.  Hepatitis C blood test.** / For all people born from [birth year] through 1965 and any individual with known risks for hepatitis C.  Abdominal aortic aneurysm (AAA) screening.** / A one-time screening for ages 71 to 34 years who are current or former smokers.  Skin self-exam. / Monthly.  Influenza vaccine. / Every year.  Tetanus, diphtheria, and acellular pertussis (Tdap/Td) vaccine.** / 1 dose of Td every 10 years.  Varicella vaccine.** / Consult your health care provider.  Zoster vaccine.** / 1 dose for adults aged 55 years or older.  Pneumococcal 13-valent conjugate (PCV13) vaccine.** / Consult your health care provider.  Pneumococcal polysaccharide (PPSV23) vaccine.** / 1 dose for all adults aged 86 years and  older.  Meningococcal vaccine.** / Consult your health care provider.  Hepatitis A vaccine.** / Consult your health care provider.  Hepatitis B vaccine.** / Consult your health care provider.  Haemophilus influenzae type b (Hib) vaccine.** / Consult your health care provider. **Family history and personal history of risk and conditions may change your health care provider's recommendations. Document Released: 10/24/2001 Document Revised: 09/02/2013 Document Reviewed: 01/23/2011 [facility] Patient Information 2015 [place], [place]. This information is not intended to replace advice given to you by your health care provider. Make sure you discuss any questions you have with your health care provider.

## 2014-10-13 NOTE — Assessment & Plan Note (Signed)
A1c 5.7 a couple of years ago, recheck A1c

## 2014-10-14 ENCOUNTER — Other Ambulatory Visit (INDEPENDENT_AMBULATORY_CARE_PROVIDER_SITE_OTHER): Payer: Medicare Other

## 2014-10-14 ENCOUNTER — Telehealth: Payer: Self-pay | Admitting: Internal Medicine

## 2014-10-14 DIAGNOSIS — E049 Nontoxic goiter, unspecified: Secondary | ICD-10-CM

## 2014-10-14 DIAGNOSIS — R7309 Other abnormal glucose: Secondary | ICD-10-CM | POA: Diagnosis not present

## 2014-10-14 DIAGNOSIS — Z125 Encounter for screening for malignant neoplasm of prostate: Secondary | ICD-10-CM

## 2014-10-14 DIAGNOSIS — E559 Vitamin D deficiency, unspecified: Secondary | ICD-10-CM | POA: Diagnosis not present

## 2014-10-14 DIAGNOSIS — E785 Hyperlipidemia, unspecified: Secondary | ICD-10-CM | POA: Diagnosis not present

## 2014-10-14 DIAGNOSIS — R7303 Prediabetes: Secondary | ICD-10-CM

## 2014-10-14 LAB — CBC WITH DIFFERENTIAL/PLATELET
BASOS ABS: 0.1 10*3/uL (ref 0.0–0.1)
BASOS PCT: 0.7 % (ref 0.0–3.0)
Eosinophils Absolute: 0.4 10*3/uL (ref 0.0–0.7)
Eosinophils Relative: 5 % (ref 0.0–5.0)
HEMATOCRIT: 39 % (ref 39.0–52.0)
Hemoglobin: 13.6 g/dL (ref 13.0–17.0)
LYMPHS ABS: 2 10*3/uL (ref 0.7–4.0)
Lymphocytes Relative: 26.4 % (ref 12.0–46.0)
MCHC: 34.8 g/dL (ref 30.0–36.0)
MCV: 91.6 fl (ref 78.0–100.0)
Monocytes Absolute: 0.6 10*3/uL (ref 0.1–1.0)
Monocytes Relative: 8 % (ref 3.0–12.0)
NEUTROS ABS: 4.6 10*3/uL (ref 1.4–7.7)
NEUTROS PCT: 59.9 % (ref 43.0–77.0)
PLATELETS: 272 10*3/uL (ref 150.0–400.0)
RBC: 4.25 Mil/uL (ref 4.22–5.81)
RDW: 13.8 % (ref 11.5–15.5)
WBC: 7.6 10*3/uL (ref 4.0–10.5)

## 2014-10-14 LAB — COMPREHENSIVE METABOLIC PANEL
ALT: 35 U/L (ref 0–53)
AST: 22 U/L (ref 0–37)
Albumin: 4.3 g/dL (ref 3.5–5.2)
Alkaline Phosphatase: 45 U/L (ref 39–117)
BUN: 18 mg/dL (ref 6–23)
CALCIUM: 9.3 mg/dL (ref 8.4–10.5)
CO2: 33 mEq/L — ABNORMAL HIGH (ref 19–32)
CREATININE: 0.97 mg/dL (ref 0.40–1.50)
Chloride: 104 mEq/L (ref 96–112)
GFR: 80.54 mL/min (ref >60.00)
Glucose, Bld: 108 mg/dL — ABNORMAL HIGH (ref 70–99)
POTASSIUM: 4.3 meq/L (ref 3.5–5.1)
SODIUM: 139 meq/L (ref 135–145)
Total Bilirubin: 1.5 mg/dL — ABNORMAL HIGH (ref 0.2–1.2)
Total Protein: 6.4 g/dL (ref 6.0–8.3)

## 2014-10-14 LAB — LIPID PANEL
CHOL/HDL RATIO: 4
Cholesterol: 192 mg/dL (ref 0–200)
HDL: 48.7 mg/dL (ref >39.00)
LDL Cholesterol: 118 mg/dL — ABNORMAL HIGH (ref 0–99)
NonHDL: 143.3
TRIGLYCERIDES: 129 mg/dL (ref 0.0–149.0)
VLDL: 25.8 mg/dL (ref 0.0–40.0)

## 2014-10-14 LAB — TSH: TSH: 1.63 u[IU]/mL (ref 0.35–4.50)

## 2014-10-14 LAB — HEMOGLOBIN A1C: Hgb A1c MFr Bld: 5.8 % (ref 4.6–6.5)

## 2014-10-14 LAB — PSA: PSA: 0.71 ng/mL (ref 0.10–4.00)

## 2014-10-14 LAB — VITAMIN D 25 HYDROXY (VIT D DEFICIENCY, FRACTURES): VITD: 28.98 ng/mL — ABNORMAL LOW (ref 30.00–100.00)

## 2014-10-16 MED ORDER — VITAMIN D (ERGOCALCIFEROL) 1.25 MG (50000 UNIT) PO CAPS: 50000.0000 [IU] | ORAL_CAPSULE | ORAL | Status: DC

## 2014-10-16 NOTE — Telephone Encounter (Signed)
Spoke with Pt, informed him of lab results. Informed him Vitamin D is low and is recommended to take Ergocalciferol 50,000 units 1 tablet weekly for 3 months (sent to [facility]), informed him GI is okay for him to proceed with cologuard but will need him to come to office to sign forms. Pt verbalized understanding and informed him I would place forms at front desk for his earliest convenience.

## 2014-10-16 NOTE — Telephone Encounter (Signed)
Advise patient,  I discussed with GI and it is appropriate to proceed with a cologuard for colon cancer screening, please arrange His labs showed a normal prostate, blood sugar and cholesterol . His vitamin D is low, needs to take OTC vitamin D daily and also ergocalciferol 50,000 units 1 tablet weekly for 3 months #12 no refills.

## 2014-10-20 ENCOUNTER — Telehealth: Payer: Self-pay | Admitting: Internal Medicine

## 2014-10-20 MED ORDER — DIAZEPAM 5 MG PO TABS: 5.0000 mg | ORAL_TABLET | Freq: Three times a day (TID) | ORAL | Status: DC

## 2014-10-20 NOTE — Telephone Encounter (Signed)
Received signed Cologuard order form from Pt and Dr. Larose Kells, faxed to [phone] with Pt snapshot and copies of insurance cards.

## 2014-10-20 NOTE — Telephone Encounter (Signed)
Pt is requesting refill on Diazepam.  Last OV: 10/13/2014 Last Fill: 08/29/2013 # [id number] 0RF UDS: 09/01/2013 Low risk  Please advise.

## 2014-10-20 NOTE — Telephone Encounter (Signed)
Caller name: [place] Relation to pt: self Call back number: [phone] Pharmacy: [facility] [address]  Reason for call:   Requesting diazepam refill

## 2014-10-20 NOTE — Telephone Encounter (Signed)
rx printed

## 2014-10-20 NOTE — Telephone Encounter (Signed)
Faxed to [facility] as requested.

## 2014-10-28 DIAGNOSIS — Z1211 Encounter for screening for malignant neoplasm of colon: Secondary | ICD-10-CM | POA: Diagnosis not present

## 2014-10-28 DIAGNOSIS — Z1212 Encounter for screening for malignant neoplasm of rectum: Secondary | ICD-10-CM | POA: Diagnosis not present

## 2014-10-28 LAB — HM COLONOSCOPY: HM COLON: NEGATIVE

## 2014-10-29 LAB — COLOGUARD

## 2014-11-10 ENCOUNTER — Telehealth: Payer: Self-pay | Admitting: Internal Medicine

## 2014-11-10 NOTE — Telephone Encounter (Signed)
Advise patient, cologuard results negative.

## 2014-11-11 NOTE — Telephone Encounter (Signed)
Letter printed and mailed to Pt.  

## 2014-11-19 ENCOUNTER — Encounter: Payer: Self-pay | Admitting: Internal Medicine

## 2014-12-01 DIAGNOSIS — M4807 Spinal stenosis, lumbosacral region: Secondary | ICD-10-CM | POA: Diagnosis not present

## 2014-12-01 DIAGNOSIS — G4700 Insomnia, unspecified: Secondary | ICD-10-CM | POA: Diagnosis not present

## 2014-12-01 DIAGNOSIS — G894 Chronic pain syndrome: Secondary | ICD-10-CM | POA: Diagnosis not present

## 2014-12-21 DIAGNOSIS — M4807 Spinal stenosis, lumbosacral region: Secondary | ICD-10-CM | POA: Diagnosis not present

## 2014-12-31 DIAGNOSIS — G894 Chronic pain syndrome: Secondary | ICD-10-CM | POA: Diagnosis not present

## 2014-12-31 DIAGNOSIS — M4807 Spinal stenosis, lumbosacral region: Secondary | ICD-10-CM | POA: Diagnosis not present

## 2014-12-31 DIAGNOSIS — G4700 Insomnia, unspecified: Secondary | ICD-10-CM | POA: Diagnosis not present

## 2015-02-18 DIAGNOSIS — M25512 Pain in left shoulder: Secondary | ICD-10-CM | POA: Diagnosis not present

## 2015-02-18 DIAGNOSIS — M7542 Impingement syndrome of left shoulder: Secondary | ICD-10-CM | POA: Diagnosis not present

## 2015-03-19 DIAGNOSIS — M4807 Spinal stenosis, lumbosacral region: Secondary | ICD-10-CM | POA: Diagnosis not present

## 2015-03-19 DIAGNOSIS — G4700 Insomnia, unspecified: Secondary | ICD-10-CM | POA: Diagnosis not present

## 2015-03-19 DIAGNOSIS — G894 Chronic pain syndrome: Secondary | ICD-10-CM | POA: Diagnosis not present

## 2015-03-19 DIAGNOSIS — Z79891 Long term (current) use of opiate analgesic: Secondary | ICD-10-CM | POA: Diagnosis not present

## 2015-04-07 ENCOUNTER — Telehealth: Payer: Self-pay | Admitting: Internal Medicine

## 2015-04-07 MED ORDER — DIAZEPAM 5 MG PO TABS: ORAL_TABLET | ORAL | Status: DC

## 2015-04-07 NOTE — Telephone Encounter (Signed)
Pt is requesting refill on Diazepam.  Last OV: 10/13/2014  Last Fill: 10/20/2014 #[id number] 0RF UDS: 09/01/2013 Low risk   Please advise.

## 2015-04-07 NOTE — Telephone Encounter (Signed)
See rx, due for UDS when he picks up.

## 2015-04-07 NOTE — Telephone Encounter (Signed)
Caller name: [facility] [phone]    Reason for call: As per patient due to medication being a controlled substance RX needs to state specific direction instead of PRN regarding diazepam (VALIUM) 5 MG tablet please follow up with pharmacy. Thank you

## 2015-04-07 NOTE — Telephone Encounter (Signed)
Spoke with Anderson Malta at [facility] informed them that the sig was supposed to be 1 tablet TID PRN for anxiety. Anderson Malta verbalized understanding.

## 2015-04-07 NOTE — Telephone Encounter (Signed)
Rx faxed to [facility]  

## 2015-04-13 ENCOUNTER — Ambulatory Visit (INDEPENDENT_AMBULATORY_CARE_PROVIDER_SITE_OTHER): Payer: Medicare Other | Admitting: Internal Medicine

## 2015-04-13 ENCOUNTER — Encounter: Payer: Self-pay | Admitting: Internal Medicine

## 2015-04-13 VITALS — BP 118/74 | HR 60 | Temp 98.2°F | Ht 71.0 in | Wt 209.2 lb

## 2015-04-13 DIAGNOSIS — R6882 Decreased libido: Secondary | ICD-10-CM

## 2015-04-13 DIAGNOSIS — E291 Testicular hypofunction: Secondary | ICD-10-CM

## 2015-04-13 DIAGNOSIS — E559 Vitamin D deficiency, unspecified: Secondary | ICD-10-CM | POA: Diagnosis not present

## 2015-04-13 DIAGNOSIS — N529 Male erectile dysfunction, unspecified: Secondary | ICD-10-CM | POA: Insufficient documentation

## 2015-04-13 DIAGNOSIS — F411 Generalized anxiety disorder: Secondary | ICD-10-CM | POA: Diagnosis not present

## 2015-04-13 LAB — VITAMIN D 25 HYDROXY (VIT D DEFICIENCY, FRACTURES): VITD: 29.66 ng/mL — ABNORMAL LOW (ref 30.00–100.00)

## 2015-04-13 MED ORDER — SILDENAFIL CITRATE 25 MG PO TABS: 50.0000 mg | ORAL_TABLET | Freq: Every day | ORAL | Status: DC | PRN

## 2015-04-13 NOTE — Progress Notes (Signed)
Pre visit review using our clinic review tool, if applicable. No additional management support is needed unless otherwise documented below in the visit note. 

## 2015-04-13 NOTE — Progress Notes (Signed)
Subjective:    Patient ID: Tyrone Diaz, male    DOB: 1941/01/18, 74 y.o.   MRN: [id number]  DOS:  04/13/2015 Type of visit - description : Routine visit Interval history: Low vitamin D, status post ergocalciferol, currently not taking OTCs. Anxiety, well-controlled with valium as needed Long history of decreased libido and erectile dysfunction, never tried medications for it, he try a "ring" but didn't work for him. Would like to explore treatment options   Review of Systems Denies chest pain, palpitations. No claudication He remains active, is not fatigue. No depression, relationship wife is very good  Past Medical History  Diagnosis Date  . ANEMIA 04/28/2010  . ANXIETY 07/23/2008  . 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 GERD 04/28/2010  . GLAUCOMA, BILATERAL 07/23/2008  . HYPERLIPIDEMIA 04/28/2010  . HYPOTHYROIDISM 04/28/2010  . PROCTITIS 07/23/2008  . TESTICULAR HYPOFUNCTION 04/28/2010  . Unspecified vitamin D deficiency 04/28/2010  . 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 Hx of echocardiogram 12/2001    Had previously shown mild MR as well as mild TR with normal systolic function.  Marland Kitchen Hx of echocardiogram 11/2006    5-years follow-up, this showed normal systolic as well as diastolic function. There was suggestion of flat mitral valve leaflet coaptation with borderline anterior mitral valve leaflet prolapse with mild associated mitral valve prolapse. He had mild TR. There was very mild aortic valve sclerosis without stenosis or insufficiency.  . History of stress test 12/2001    Showed normal perfusion with mild apical thinning.  Marland Kitchen History of stress test 11/2007    Post stress myocardial perfusion images show a normal pattern of perfusion in the all regions, the post stress left ventricular is normal in size. There is no scintigraphic evidence of inducible myocardial ischemia. The post EF 64%    Past Surgical History  Procedure Laterality Date  . Tonsilectomy,  adenoidectomy, bilateral myringotomy and tubes  1943  . Ear injury      piece of ear plug dislodged from ear  . Wisdom tooth extraction    . Dental surgery      History   Social History  . Marital Status: Married    Spouse Name: Thornton Papas  . Number of Children: 3  . Years of Education: Masters   Occupational History  . retired 2010, English as a second language teacher    Social History Main Topics  . Smoking status: Former Research scientist (life sciences)  . Smokeless tobacco: Never Used     Comment: quit 1970  . Alcohol Use: 0.0 oz/week    0 Standard drinks or equivalent per week     Comment: occ.  . Drug Use: No  . Sexual Activity: Not on file   Other Topics Concern  . Not on file   Social History Narrative        Medication List       This list is accurate as of: 04/13/15  4:55 PM.  Always use your most recent med list.               aspirin 81 MG tablet  Take 81 mg by mouth daily.     diazepam 5 MG tablet  Commonly known as:  VALIUM  PRN anxiety     EPINEPHrine 0.3 mg/0.3 mL Soaj injection  Commonly known as:  EPI-PEN  Inject 0.3 mLs (0.3 mg total) into the muscle once.     HYDROcodone-acetaminophen 5-325 MG per tablet  Commonly known as:  NORCO/VICODIN  Take 1 tablet by mouth every 6 (  six) hours as needed for moderate pain. See's pain management     latanoprost 0.005 % ophthalmic solution  Commonly known as:  XALATAN  Place 1 drop into both eyes at bedtime.     pantoprazole 40 MG tablet  Commonly known as:  PROTONIX  Take 1 tablet (40 mg total) by mouth daily.     sildenafil 25 MG tablet  Commonly known as:  VIAGRA  Take 2-3 tablets (50-75 mg total) by mouth daily as needed for erectile dysfunction.     timolol 0.5 % ophthalmic solution  Commonly known as:  TIMOPTIC           Objective:   Physical Exam BP 118/74 mmHg  Pulse 60  Temp(Src) 98.2 F (36.8 C) (Oral)  Ht 5\' 11"  (1.803 m)  Wt 209 lb 4 oz (94.915 kg)  BMI 29.20 kg/m2  SpO2 96% General:   Well developed, well  nourished . NAD.  HEENT:  Normocephalic . Face symmetric, atraumatic Abdomen: Nontender, nondistended, not Lower extremities: Normal femoral pulses No pretibial edema bilaterally  GU: Normal scrotal contents Skin: Not pale. Not jaundice Neurologic:  alert & oriented X3.  Speech normal, gait appropriate for age and unassisted Psych--  Cognition and judgment appear intact.  Cooperative with normal attention span and concentration.  Behavior appropriate. No anxious or depressed appearing.      Assessment & Plan:

## 2015-04-13 NOTE — Assessment & Plan Note (Addendum)
Reports erectal dysfunction and decreased libido, will check a testosterone level.

## 2015-04-13 NOTE — Assessment & Plan Note (Addendum)
Well-controlled with Valium as needed, he gets Vicodin from pain management and had recently a UDS w/ them. No need for UDS at this office

## 2015-04-13 NOTE — Assessment & Plan Note (Signed)
Likes to explore treatment, recommend Viagra, how to use it and side effects discussed.

## 2015-04-13 NOTE — Patient Instructions (Signed)
Get your blood work before you leave   take Viagra as prescribed

## 2015-04-13 NOTE — Assessment & Plan Note (Addendum)
Most recent vitamin D low, status post ergocalciferol, last dose approximately 2 months ago, recheck levels. Currently not on supplements

## 2015-04-14 LAB — TESTOSTERONE, FREE, TOTAL, SHBG
Sex Hormone Binding: 30 nmol/L (ref 22–77)
Testosterone, Free: 60.7 pg/mL (ref 47.0–244.0)
Testosterone-% Free: 2.1 % (ref 1.6–2.9)
Testosterone: 293 ng/dL — ABNORMAL LOW (ref 300–890)

## 2015-04-15 MED ORDER — VITAMIN D (ERGOCALCIFEROL) 1.25 MG (50000 UNIT) PO CAPS: 50000.0000 [IU] | ORAL_CAPSULE | ORAL | Status: DC

## 2015-04-15 NOTE — Addendum Note (Signed)
Addended by: Janalee Dane C on: 04/15/2015 11:09 AM   Modules accepted: Orders

## 2015-04-19 ENCOUNTER — Other Ambulatory Visit: Payer: Self-pay

## 2015-04-19 ENCOUNTER — Encounter: Payer: Self-pay | Admitting: Internal Medicine

## 2015-04-19 MED ORDER — SILDENAFIL CITRATE 25 MG PO TABS: 50.0000 mg | ORAL_TABLET | Freq: Every day | ORAL | Status: DC | PRN

## 2015-04-20 ENCOUNTER — Other Ambulatory Visit: Payer: Self-pay

## 2015-04-20 MED ORDER — SILDENAFIL CITRATE 20 MG PO TABS: 60.0000 mg | ORAL_TABLET | Freq: Every day | ORAL | Status: DC | PRN

## 2015-06-02 DIAGNOSIS — G4700 Insomnia, unspecified: Secondary | ICD-10-CM | POA: Diagnosis not present

## 2015-06-02 DIAGNOSIS — G894 Chronic pain syndrome: Secondary | ICD-10-CM | POA: Diagnosis not present

## 2015-06-02 DIAGNOSIS — M4807 Spinal stenosis, lumbosacral region: Secondary | ICD-10-CM | POA: Diagnosis not present

## 2015-08-23 DIAGNOSIS — M4807 Spinal stenosis, lumbosacral region: Secondary | ICD-10-CM | POA: Diagnosis not present

## 2015-08-23 DIAGNOSIS — G4700 Insomnia, unspecified: Secondary | ICD-10-CM | POA: Diagnosis not present

## 2015-08-23 DIAGNOSIS — G894 Chronic pain syndrome: Secondary | ICD-10-CM | POA: Diagnosis not present

## 2015-08-31 ENCOUNTER — Ambulatory Visit (INDEPENDENT_AMBULATORY_CARE_PROVIDER_SITE_OTHER): Payer: Medicare Other

## 2015-08-31 DIAGNOSIS — Z23 Encounter for immunization: Secondary | ICD-10-CM | POA: Diagnosis not present

## 2015-10-14 ENCOUNTER — Telehealth: Payer: Self-pay | Admitting: Behavioral Health

## 2015-10-14 ENCOUNTER — Encounter: Payer: Self-pay | Admitting: Behavioral Health

## 2015-10-14 NOTE — Telephone Encounter (Signed)
Pre-Visit Call completed with patient and chart updated.   Pre-Visit Info documented in Specialty Comments under SnapShot.    

## 2015-10-15 ENCOUNTER — Ambulatory Visit (INDEPENDENT_AMBULATORY_CARE_PROVIDER_SITE_OTHER): Payer: Medicare Other | Admitting: Internal Medicine

## 2015-10-15 ENCOUNTER — Encounter: Payer: Self-pay | Admitting: Internal Medicine

## 2015-10-15 VITALS — BP 118/76 | HR 69 | Temp 97.5°F | Ht 71.0 in | Wt 209.4 lb

## 2015-10-15 DIAGNOSIS — Z0000 Encounter for general adult medical examination without abnormal findings: Secondary | ICD-10-CM | POA: Diagnosis not present

## 2015-10-15 DIAGNOSIS — R739 Hyperglycemia, unspecified: Secondary | ICD-10-CM

## 2015-10-15 DIAGNOSIS — I8393 Asymptomatic varicose veins of bilateral lower extremities: Secondary | ICD-10-CM

## 2015-10-15 DIAGNOSIS — F411 Generalized anxiety disorder: Secondary | ICD-10-CM

## 2015-10-15 DIAGNOSIS — E785 Hyperlipidemia, unspecified: Secondary | ICD-10-CM | POA: Diagnosis not present

## 2015-10-15 DIAGNOSIS — E559 Vitamin D deficiency, unspecified: Secondary | ICD-10-CM

## 2015-10-15 MED ORDER — EPINEPHRINE 0.3 MG/0.3ML IJ SOAJ: 0.3000 mg | Freq: Once | INTRAMUSCULAR | Status: DC

## 2015-10-15 MED ORDER — DIAZEPAM 5 MG PO TABS: 5.0000 mg | ORAL_TABLET | Freq: Every day | ORAL | Status: DC | PRN

## 2015-10-15 MED ORDER — SILDENAFIL CITRATE 20 MG PO TABS: 60.0000 mg | ORAL_TABLET | Freq: Every day | ORAL | Status: DC | PRN

## 2015-10-15 NOTE — Progress Notes (Signed)
Pre visit review using our clinic review tool, if applicable. No additional management support is needed unless otherwise documented below in the visit note. 

## 2015-10-15 NOTE — Progress Notes (Signed)
Subjective:    Patient ID: Tyrone Diaz, male    DOB: 1941-07-01, 75 y.o.   MRN: [id number]  DOS:  10/15/2015 Type of visit - description :  Here for Medicare AWV:  1. Risk factors based on Past M, S, F history: reviewed 2. Physical Activities: treadmill daily, less yard work  this year 3. Depression/mood: neg screen 4. Hearing: high freq hearing loss, h/o exposure to noise, problem not worse   5. ADL's:  independent 6. Fall Risk: no recent fall,  see instructions 7. home Safety: does feelsafe at home   8. Height, weight, &visual acuity: see VS, vision ok w/ glasses , sees eye doctor yearly, developing cataracts   9. Counseling: provided 10. Labs ordered based on risk factors: if needed   11. Referral Coordination: if needed 12.  Care Plan, see assessment and plan   13.   Cognitive Assessment: motor skills and cognition wnl 14. Care team updated  15. HC-POA - discussed   In addition, today we discussed the following: Pre-DM: Due for A1c Dyslipidemia: Due for a FLP Likes to discuss varicose vein treatment and prevention. Vitamin D deficiency: Not on supplements Allergies: needs a rx  for a EpiPen ED: Well-controlled with Viagra without side effects.  Review of Systems Constitutional: No fever. No chills. No unexplained wt changes. No unusual sweats  HEENT: No dental problems, no ear discharge, no facial swelling, no voice changes. No eye discharge, no eye  redness , no  intolerance to light   Respiratory: No wheezing , no  difficulty breathing. No cough , no mucus production  Cardiovascular: No CP, no leg swelling , no  Palpitations  GI: no nausea, no vomiting, no diarrhea , no  abdominal pain.  No blood in the stools. No dysphagia, no odynophagia    Endocrine: No polyphagia, no polyuria , no polydipsia  GU: No dysuria, gross hematuria, difficulty urinating. No urinary urgency, no frequency.  Musculoskeletal: No joint swellings or unusual aches or pains  Skin: No  change in the color of the skin, palor , no  Rash  Allergic, immunologic: No environmental allergies , no  food allergies  Neurological: No dizziness no  syncope. No headaches. No diplopia, no slurred, no slurred speech, no motor deficits, no facial  Numbness  Hematological: No enlarged lymph nodes, no easy bruising , no unusual bleedings  Psychiatry: No suicidal ideas, no hallucinations, no beavior problems, no confusion.  No unusual/severe anxiety, no depression   Past Medical History  Diagnosis Date  . ANEMIA 04/28/2010  . ANXIETY 07/23/2008  . 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 GERD 04/28/2010  . GLAUCOMA, BILATERAL 07/23/2008  . HYPERLIPIDEMIA 04/28/2010  . HYPOTHYROIDISM 04/28/2010  . PROCTITIS 07/23/2008  . TESTICULAR HYPOFUNCTION 04/28/2010  . Unspecified vitamin D deficiency 04/28/2010  . 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 Hx of echocardiogram 12/2001    Had previously shown mild MR as well as mild TR with normal systolic function.  Marland Kitchen Hx of echocardiogram 11/2006    5-years follow-up, this showed normal systolic as well as diastolic function. There was suggestion of flat mitral valve leaflet coaptation with borderline anterior mitral valve leaflet prolapse with mild associated mitral valve prolapse. He had mild TR. There was very mild aortic valve sclerosis without stenosis or insufficiency.  . History of stress test 12/2001    Showed normal perfusion with mild apical thinning.  Marland Kitchen History of stress test 11/2007    Post stress myocardial perfusion images show a  normal pattern of perfusion in the all regions, the post stress left ventricular is normal in size. There is no scintigraphic evidence of inducible myocardial ischemia. The post EF 64%    Past Surgical History  Procedure Laterality Date  . Tonsilectomy, adenoidectomy, bilateral myringotomy and tubes  1943  . Ear injury      piece of ear plug dislodged from ear  . Wisdom tooth extraction    . Dental  surgery      Social History   Social History  . Marital Status: Married    Spouse Name: Thornton Papas  . Number of Children: 3  . Years of Education: Masters   Occupational History  . retired 2010, English as a second language teacher    Social History Main Topics  . Smoking status: Former Research scientist (life sciences)  . Smokeless tobacco: Never Used     Comment: quit 1970  . Alcohol Use: 0.0 oz/week    0 Standard drinks or equivalent per week     Comment: occ.  . Drug Use: No  . Sexual Activity: Not on file   Other Topics Concern  . Not on file   Social History Narrative   Household: pt and wife   Lost older child years ago     Family History  Problem Relation Age of Onset  . Arthritis Mother   . Heart disease Mother     CHF  . Hypertension Mother   . Arthritis Father   . Stroke Father   . Hypertension Father   . Colon cancer Neg Hx   . Pancreatic cancer Neg Hx   . Stomach cancer Neg Hx   . Prostate cancer Neg Hx        Medication List       This list is accurate as of: 10/15/15  5:46 PM.  Always use your most recent med list.               aspirin 81 MG tablet  Take 81 mg by mouth daily.     cholecalciferol 1000 units tablet  Commonly known as:  VITAMIN D  Take 1,000 Units by mouth daily.     diazepam 5 MG tablet  Commonly known as:  VALIUM  Take 1 tablet (5 mg total) by mouth daily as needed for anxiety. PRN anxiety     EPINEPHrine 0.3 mg/0.3 mL Soaj injection  Commonly known as:  ADRENACLICK  Inject 0.3 mLs (0.3 mg total) into the muscle once. Reported on 10/15/2015     fluticasone 50 MCG/ACT nasal spray  Commonly known as:  FLONASE  Place 1 spray into both nostrils daily.     HYDROcodone-acetaminophen 5-325 MG tablet  Commonly known as:  NORCO/VICODIN  Take 1 tablet by mouth every 6 (six) hours as needed for moderate pain. Reported on 10/15/2015     latanoprost 0.005 % ophthalmic solution  Commonly known as:  XALATAN  Place 1 drop into both eyes at bedtime.     sildenafil 20 MG  tablet  Commonly known as:  REVATIO  Take 3-4 tablets (60-80 mg total) by mouth daily as needed.     timolol 0.5 % ophthalmic solution  Commonly known as:  TIMOPTIC           Objective:   Physical Exam BP 118/76 mmHg  Pulse 69  Temp(Src) 97.5 F (36.4 C) (Oral)  Ht 5\' 11"  (1.803 m)  Wt 209 lb 6 oz (94.972 kg)  BMI 29.21 kg/m2  SpO2 97% General:   Well developed, well  nourished . NAD.  HEENT:  Normocephalic . Face symmetric, atraumatic Lungs:  CTA B Normal respiratory effort, no intercostal retractions, no accessory muscle use. Heart: RRR,  no murmur.  no pretibial edema bilaterally  Abdomen:  Not distended, soft, non-tender. No rebound or rigidity. No mass,organomegaly Skin: Not pale. Not jaundice Neurologic:  alert & oriented X3.  Speech normal, gait appropriate for age and unassisted Psych--  Cognition and judgment appear intact.  Cooperative with normal attention span and concentration.  Behavior appropriate. No anxious or depressed appearing.    Assessment & Plan:   Assessment Prediabetes Dyslipidemia Anxiety - on valium rx by pcp, get UDS @ pain mngmt  GI: GERD, IBS Allergies : Peanuts, pollen, sunlight, trees,(h/o anal pruritus stopped after peanuts dc from diet) Hypogonadism -- free T wnl 2016  ED, decreased libido MSK: spinal stenosis -- sees Dr Ace Gins Glaucoma Varicose veins H/o goiter, remote bx H/o low vit d H/p palpitations, normal echo 2008, (-) stress test 2009  PLAN: Diabetes: On lifestyle modification, check A1c Mild dyslipidemia: Check FLP Anxiety: Needs a refill on Valium, takes it sporadically. Allergies: Prescribed EpiPen ED: Refill Viagra, good results without side effects MSK: Uses prednisone sporadically, will call if needs a refill Varicose veins: Prevention discussed, needs to use compression stockings. Ablation is an option, if you decide to proceed he will call for radiolology referral. Low vitamin D: Recommend supplements  daily RTC one year

## 2015-10-15 NOTE — Assessment & Plan Note (Signed)
Td 07 ; zostavax and pneumonia shot 2011, prevnar 2016 ; had a Flu shot   Cscope 2002, COLOGUARD (-) 10-2014 PSA normal 2016. Reassess 2018  Continue with his healthy lifestyle.

## 2015-10-15 NOTE — Patient Instructions (Addendum)
Get your blood work next week    Please consider visit these websites for more information:  www.begintheconversation.org  Theconversationproject.org   Next visit 1 year, sooner if needed       Fall Prevention and Home Safety Falls cause injuries and can affect all age groups. It is possible to use preventive measures to significantly decrease the likelihood of falls. There are many simple measures which can make your home safer and prevent falls. OUTDOORS  Repair cracks and edges of walkways and driveways.  Remove high doorway thresholds.  Trim shrubbery on the main path into your home.  Have good outside lighting.  Clear walkways of tools, rocks, debris, and clutter.  Check that handrails are not broken and are securely fastened. Both sides of steps should have handrails.  Have leaves, snow, and ice cleared regularly.  Use sand or salt on walkways during winter months.  In the garage, clean up grease or oil spills. BATHROOM  Install night lights.  Install grab bars by the toilet and in the tub and shower.  Use non-skid mats or decals in the tub or shower.  Place a plastic non-slip stool in the shower to sit on, if needed.  Keep floors dry and clean up all water on the floor immediately.  Remove soap buildup in the tub or shower on a regular basis.  Secure bath mats with non-slip, double-sided rug tape.  Remove throw rugs and tripping hazards from the floors. BEDROOMS  Install night lights.  Make sure a bedside light is easy to reach.  Do not use oversized bedding.  Keep a telephone by your bedside.  Have a firm chair with side arms to use for getting dressed.  Remove throw rugs and tripping hazards from the floor. KITCHEN  Keep handles on pots and pans turned toward the center of the stove. Use back burners when possible.  Clean up spills quickly and allow time for drying.  Avoid walking on wet floors.  Avoid hot utensils and  knives.  Position shelves so they are not too high or low.  Place commonly used objects within easy reach.  If necessary, use a sturdy step stool with a grab bar when reaching.  Keep electrical cables out of the way.  Do not use floor polish or wax that makes floors slippery. If you must use wax, use non-skid floor wax.  Remove throw rugs and tripping hazards from the floor. STAIRWAYS  Never leave objects on stairs.  Place handrails on both sides of stairways and use them. Fix any loose handrails. Make sure handrails on both sides of the stairways are as long as the stairs.  Check carpeting to make sure it is firmly attached along stairs. Make repairs to worn or loose carpet promptly.  Avoid placing throw rugs at the top or bottom of stairways, or properly secure the rug with carpet tape to prevent slippage. Get rid of throw rugs, if possible.  Have an electrician put in a light switch at the top and bottom of the stairs. OTHER FALL PREVENTION TIPS  Wear low-heel or rubber-soled shoes that are supportive and fit well. Wear closed toe shoes.  When using a stepladder, make sure it is fully opened and both spreaders are firmly locked. Do not climb a closed stepladder.  Add color or contrast paint or tape to grab bars and handrails in your home. Place contrasting color strips on first and last steps.  Learn and use mobility aids as needed. Install an Dealer  emergency response system.  Turn on lights to avoid dark areas. Replace light bulbs that burn out immediately. Get light switches that glow.  Arrange furniture to create clear pathways. Keep furniture in the same place.  Firmly attach carpet with non-skid or double-sided tape.  Eliminate uneven floor surfaces.  Select a carpet pattern that does not visually hide the edge of steps.  Be aware of all pets. OTHER HOME SAFETY TIPS  Set the water temperature for 120 F (48.8 C).  Keep emergency numbers on or near the  telephone.  Keep smoke detectors on every level of the home and near sleeping areas. Document Released: 08/18/2002 Document Revised: 02/27/2012 Document Reviewed: 11/17/2011 [facility] Patient Information 2015 [place], [place]. This information is not intended to replace advice given to you by your health care provider. Make sure you discuss any questions you have with your health care provider.   Preventive Care for Adults Ages 70 and over  Blood pressure check.** / Every 1 to 2 years.  Lipid and cholesterol check.**/ Every 5 years beginning at age 11.  Lung cancer screening. / Every year if you are aged 42-80 years and have a 30-pack-year history of smoking and currently smoke or have quit within the past 15 years. Yearly screening is stopped once you have quit smoking for at least 15 years or develop a health problem that would prevent you from having lung cancer treatment.  Fecal occult blood test (FOBT) of stool. / Every year beginning at age 45 and continuing until age 45. You may not have to do this test if you get a colonoscopy every 10 years.  Flexible sigmoidoscopy** or colonoscopy.** / Every 5 years for a flexible sigmoidoscopy or every 10 years for a colonoscopy beginning at age 5 and continuing until age 4.  Hepatitis C blood test.** / For all people born from [birth year] through 1965 and any individual with known risks for hepatitis C.  Abdominal aortic aneurysm (AAA) screening.** / A one-time screening for ages 76 to 6 years who are current or former smokers.  Skin self-exam. / Monthly.  Influenza vaccine. / Every year.  Tetanus, diphtheria, and acellular pertussis (Tdap/Td) vaccine.** / 1 dose of Td every 10 years.  Varicella vaccine.** / Consult your health care provider.  Zoster vaccine.** / 1 dose for adults aged 54 years or older.  Pneumococcal 13-valent conjugate (PCV13) vaccine.** / Consult your health care provider.  Pneumococcal polysaccharide (PPSV23) vaccine.**  / 1 dose for all adults aged 17 years and older.  Meningococcal vaccine.** / Consult your health care provider.  Hepatitis A vaccine.** / Consult your health care provider.  Hepatitis B vaccine.** / Consult your health care provider.  Haemophilus influenzae type b (Hib) vaccine.** / Consult your health care provider. **Family history and personal history of risk and conditions may change your health care provider's recommendations. Document Released: 10/24/2001 Document Revised: 09/02/2013 Document Reviewed: 01/23/2011 [facility] Patient Information 2015 [place], [place]. This information is not intended to replace advice given to you by your health care provider. Make sure you discuss any questions you have with your health care provider.

## 2015-10-19 ENCOUNTER — Other Ambulatory Visit (INDEPENDENT_AMBULATORY_CARE_PROVIDER_SITE_OTHER): Payer: Medicare Other

## 2015-10-19 DIAGNOSIS — E785 Hyperlipidemia, unspecified: Secondary | ICD-10-CM | POA: Diagnosis not present

## 2015-10-19 DIAGNOSIS — R739 Hyperglycemia, unspecified: Secondary | ICD-10-CM

## 2015-10-19 LAB — BASIC METABOLIC PANEL
BUN: 19 mg/dL (ref 6–23)
CALCIUM: 8.9 mg/dL (ref 8.4–10.5)
CO2: 30 meq/L (ref 19–32)
CREATININE: 0.91 mg/dL (ref 0.40–1.50)
Chloride: 103 mEq/L (ref 96–112)
GFR: 86.46 mL/min (ref >60.00)
GLUCOSE: 104 mg/dL — AB (ref 70–99)
Potassium: 4 mEq/L (ref 3.5–5.1)
Sodium: 138 mEq/L (ref 135–145)

## 2015-10-19 LAB — LIPID PANEL
Cholesterol: 179 mg/dL (ref 0–200)
HDL: 52.1 mg/dL (ref >39.00)
LDL Cholesterol: 111 mg/dL — ABNORMAL HIGH (ref 0–99)
NonHDL: 127.3
Total CHOL/HDL Ratio: 3
Triglycerides: 84 mg/dL (ref 0.0–149.0)
VLDL: 16.8 mg/dL (ref 0.0–40.0)

## 2015-10-19 LAB — HEMOGLOBIN A1C: Hgb A1c MFr Bld: 5.6 % (ref 4.6–6.5)

## 2015-11-16 DIAGNOSIS — G4700 Insomnia, unspecified: Secondary | ICD-10-CM | POA: Diagnosis not present

## 2015-11-16 DIAGNOSIS — M62830 Muscle spasm of back: Secondary | ICD-10-CM | POA: Diagnosis not present

## 2015-11-16 DIAGNOSIS — G894 Chronic pain syndrome: Secondary | ICD-10-CM | POA: Diagnosis not present

## 2015-11-16 DIAGNOSIS — M4807 Spinal stenosis, lumbosacral region: Secondary | ICD-10-CM | POA: Diagnosis not present

## 2015-12-02 DIAGNOSIS — M4807 Spinal stenosis, lumbosacral region: Secondary | ICD-10-CM | POA: Diagnosis not present

## 2015-12-08 DIAGNOSIS — H25013 Cortical age-related cataract, bilateral: Secondary | ICD-10-CM | POA: Diagnosis not present

## 2015-12-08 DIAGNOSIS — H2513 Age-related nuclear cataract, bilateral: Secondary | ICD-10-CM | POA: Diagnosis not present

## 2015-12-08 DIAGNOSIS — H25042 Posterior subcapsular polar age-related cataract, left eye: Secondary | ICD-10-CM | POA: Diagnosis not present

## 2015-12-08 DIAGNOSIS — H401130 Primary open-angle glaucoma, bilateral, stage unspecified: Secondary | ICD-10-CM | POA: Diagnosis not present

## 2015-12-14 DIAGNOSIS — M4800 Spinal stenosis, site unspecified: Secondary | ICD-10-CM | POA: Diagnosis not present

## 2015-12-14 DIAGNOSIS — M5136 Other intervertebral disc degeneration, lumbar region: Secondary | ICD-10-CM | POA: Diagnosis not present

## 2015-12-14 DIAGNOSIS — M5126 Other intervertebral disc displacement, lumbar region: Secondary | ICD-10-CM | POA: Diagnosis not present

## 2016-01-17 ENCOUNTER — Encounter: Payer: Self-pay | Admitting: Internal Medicine

## 2016-02-23 DIAGNOSIS — M62830 Muscle spasm of back: Secondary | ICD-10-CM | POA: Diagnosis not present

## 2016-02-23 DIAGNOSIS — G4700 Insomnia, unspecified: Secondary | ICD-10-CM | POA: Diagnosis not present

## 2016-02-23 DIAGNOSIS — M4807 Spinal stenosis, lumbosacral region: Secondary | ICD-10-CM | POA: Diagnosis not present

## 2016-02-23 DIAGNOSIS — G894 Chronic pain syndrome: Secondary | ICD-10-CM | POA: Diagnosis not present

## 2016-03-20 DIAGNOSIS — L299 Pruritus, unspecified: Secondary | ICD-10-CM | POA: Diagnosis not present

## 2016-03-20 DIAGNOSIS — L300 Nummular dermatitis: Secondary | ICD-10-CM | POA: Diagnosis not present

## 2016-04-17 DIAGNOSIS — M62830 Muscle spasm of back: Secondary | ICD-10-CM | POA: Diagnosis not present

## 2016-04-17 DIAGNOSIS — M4807 Spinal stenosis, lumbosacral region: Secondary | ICD-10-CM | POA: Diagnosis not present

## 2016-04-17 DIAGNOSIS — G4700 Insomnia, unspecified: Secondary | ICD-10-CM | POA: Diagnosis not present

## 2016-04-17 DIAGNOSIS — G894 Chronic pain syndrome: Secondary | ICD-10-CM | POA: Diagnosis not present

## 2016-04-23 ENCOUNTER — Other Ambulatory Visit: Payer: Self-pay | Admitting: Internal Medicine

## 2016-04-24 NOTE — Telephone Encounter (Signed)
Rx printed, awaiting MD signature.  

## 2016-04-24 NOTE — Telephone Encounter (Signed)
Okay #90 and 1 refill 

## 2016-04-24 NOTE — Telephone Encounter (Signed)
Rx faxed to [facility]  

## 2016-04-24 NOTE — Telephone Encounter (Signed)
Pt is requesting refill on Diazepam.  Last OV: 10/15/2015 Last Fill: 10/15/2015 #90 and 1RF UDS: 09/01/2013 Low risk  Please advise.

## 2016-05-02 DIAGNOSIS — I8311 Varicose veins of right lower extremity with inflammation: Secondary | ICD-10-CM | POA: Diagnosis not present

## 2016-05-03 DIAGNOSIS — I8311 Varicose veins of right lower extremity with inflammation: Secondary | ICD-10-CM | POA: Diagnosis not present

## 2016-05-04 DIAGNOSIS — M4807 Spinal stenosis, lumbosacral region: Secondary | ICD-10-CM | POA: Diagnosis not present

## 2016-05-17 DIAGNOSIS — I8311 Varicose veins of right lower extremity with inflammation: Secondary | ICD-10-CM | POA: Diagnosis not present

## 2016-05-23 DIAGNOSIS — I8311 Varicose veins of right lower extremity with inflammation: Secondary | ICD-10-CM | POA: Diagnosis not present

## 2016-05-25 DIAGNOSIS — I8311 Varicose veins of right lower extremity with inflammation: Secondary | ICD-10-CM | POA: Diagnosis not present

## 2016-05-30 DIAGNOSIS — I8311 Varicose veins of right lower extremity with inflammation: Secondary | ICD-10-CM | POA: Diagnosis not present

## 2016-06-20 DIAGNOSIS — I8311 Varicose veins of right lower extremity with inflammation: Secondary | ICD-10-CM | POA: Diagnosis not present

## 2016-06-30 DIAGNOSIS — M7981 Nontraumatic hematoma of soft tissue: Secondary | ICD-10-CM | POA: Diagnosis not present

## 2016-07-11 DIAGNOSIS — I8311 Varicose veins of right lower extremity with inflammation: Secondary | ICD-10-CM | POA: Diagnosis not present

## 2016-07-24 DIAGNOSIS — M62830 Muscle spasm of back: Secondary | ICD-10-CM | POA: Diagnosis not present

## 2016-07-24 DIAGNOSIS — G4700 Insomnia, unspecified: Secondary | ICD-10-CM | POA: Diagnosis not present

## 2016-07-24 DIAGNOSIS — M4807 Spinal stenosis, lumbosacral region: Secondary | ICD-10-CM | POA: Diagnosis not present

## 2016-07-24 DIAGNOSIS — G894 Chronic pain syndrome: Secondary | ICD-10-CM | POA: Diagnosis not present

## 2016-08-01 ENCOUNTER — Ambulatory Visit (INDEPENDENT_AMBULATORY_CARE_PROVIDER_SITE_OTHER): Payer: Medicare Other

## 2016-08-01 DIAGNOSIS — Z23 Encounter for immunization: Secondary | ICD-10-CM | POA: Diagnosis not present

## 2016-10-16 ENCOUNTER — Encounter: Payer: Self-pay | Admitting: Internal Medicine

## 2016-10-17 ENCOUNTER — Encounter: Payer: Self-pay | Admitting: Internal Medicine

## 2016-10-17 ENCOUNTER — Encounter: Payer: Self-pay | Admitting: Gastroenterology

## 2016-10-17 ENCOUNTER — Ambulatory Visit (INDEPENDENT_AMBULATORY_CARE_PROVIDER_SITE_OTHER): Payer: Medicare Other | Admitting: Internal Medicine

## 2016-10-17 VITALS — BP 128/74 | HR 54 | Temp 97.7°F | Resp 14 | Ht 71.0 in | Wt 209.4 lb

## 2016-10-17 DIAGNOSIS — F411 Generalized anxiety disorder: Secondary | ICD-10-CM | POA: Diagnosis not present

## 2016-10-17 DIAGNOSIS — Z125 Encounter for screening for malignant neoplasm of prostate: Secondary | ICD-10-CM

## 2016-10-17 DIAGNOSIS — E785 Hyperlipidemia, unspecified: Secondary | ICD-10-CM | POA: Diagnosis not present

## 2016-10-17 DIAGNOSIS — Z0000 Encounter for general adult medical examination without abnormal findings: Secondary | ICD-10-CM

## 2016-10-17 DIAGNOSIS — Z09 Encounter for follow-up examination after completed treatment for conditions other than malignant neoplasm: Secondary | ICD-10-CM | POA: Insufficient documentation

## 2016-10-17 DIAGNOSIS — E559 Vitamin D deficiency, unspecified: Secondary | ICD-10-CM

## 2016-10-17 DIAGNOSIS — Z23 Encounter for immunization: Secondary | ICD-10-CM | POA: Diagnosis not present

## 2016-10-17 DIAGNOSIS — R1310 Dysphagia, unspecified: Secondary | ICD-10-CM | POA: Diagnosis not present

## 2016-10-17 DIAGNOSIS — R739 Hyperglycemia, unspecified: Secondary | ICD-10-CM | POA: Diagnosis not present

## 2016-10-17 DIAGNOSIS — Z1329 Encounter for screening for other suspected endocrine disorder: Secondary | ICD-10-CM

## 2016-10-17 MED ORDER — FLUTICASONE PROPIONATE 50 MCG/ACT NA SUSP: 1.0000 | Freq: Every day | NASAL | 5 refills | Status: DC

## 2016-10-17 MED ORDER — SILDENAFIL CITRATE 20 MG PO TABS: 60.0000 mg | ORAL_TABLET | Freq: Every day | ORAL | 6 refills | Status: DC | PRN

## 2016-10-17 MED ORDER — DIAZEPAM 5 MG PO TABS: 5.0000 mg | ORAL_TABLET | Freq: Every day | ORAL | 1 refills | Status: DC | PRN

## 2016-10-17 NOTE — Patient Instructions (Signed)
  GO TO THE FRONT DESK Schedule labs to be done this week, fasting  Schedule your next appointment for a  checkup in one year  Schedule a Medicare wellness with one of our nurses  We are referring to the stomach doctor

## 2016-10-17 NOTE — Progress Notes (Signed)
Pre visit review using our clinic review tool, if applicable. No additional management support is needed unless otherwise documented below in the visit note. 

## 2016-10-17 NOTE — Assessment & Plan Note (Signed)
Td today.  Prostate cancer screening: DRE normal today, will check a PSA  Recommend a Medicare wellness

## 2016-10-17 NOTE — Progress Notes (Signed)
Subjective:    Patient ID: Tyrone Diaz, male    DOB: 1941/02/08, 76 y.o.   MRN: [id number]  DOS:  10/17/2016 Type of visit - description : ROV Interval history: Prediabetes: Due for labs Mild dyslipidemia, diet control, due for labs Anxiety: Refill Valium, take it sporadically ED: Needs a refill on  medications Concern about his vitamin levels, thyroid and prostate health.   Review of Systems Constitutional: No fever. No chills. No unexplained wt changes. No unusual sweats  HEENT: No dental problems, no ear discharge, no facial swelling, no voice changes. No eye discharge, no eye  redness , no  intolerance to light   Respiratory: No wheezing , no  difficulty breathing. No cough , no mucus production  Cardiovascular: No CP, no leg swelling , no  Palpitations  GI:  6 months history of dysphagia, mostly to solids like rice, not with fluids. Denies nausea, vomiting, blood in the stools or weight loss, some odynophagia. He has a history of GERD but currently no heartburn,  on no medications. no nausea, no vomiting, no diarrhea , no  abdominal pain.  No blood in the stools.     Endocrine: No polyphagia, no polyuria , no polydipsia  GU: No dysuria, gross hematuria, difficulty urinating. No urinary urgency, no frequency.  Musculoskeletal: No joint swellings or unusual aches or pains  Skin: No change in the color of the skin, palor , no  Rash  Allergic, immunologic: No environmental allergies , no  food allergies  Neurological: No dizziness no  syncope. No headaches. No diplopia, no slurred, no slurred speech, no motor deficits, no facial  Numbness  Hematological: No enlarged lymph nodes, no easy bruising , no unusual bleedings  Psychiatry: No suicidal ideas, no hallucinations, no beavior problems, no confusion.  No unusual/severe anxiety, no depression   Past Medical History:  Diagnosis Date  . ANEMIA 04/28/2010  . ANXIETY 07/23/2008  . ARTHRITIS, CERVICAL SPINE  07/23/2008  . GERD 04/28/2010  . GLAUCOMA, BILATERAL 07/23/2008  . History of stress test 12/2001   Showed normal perfusion with mild apical thinning.  Marland Kitchen History of stress test 11/2007   Post stress myocardial perfusion images show a normal pattern of perfusion in the all regions, the post stress left ventricular is normal in size. There is no scintigraphic evidence of inducible myocardial ischemia. The post EF 64%  . Hx of echocardiogram 12/2001   Had previously shown mild MR as well as mild TR with normal systolic function.  Marland Kitchen Hx of echocardiogram 11/2006   5-years follow-up, this showed normal systolic as well as diastolic function. There was suggestion of flat mitral valve leaflet coaptation with borderline anterior mitral valve leaflet prolapse with mild associated mitral valve prolapse. He had mild TR. There was very mild aortic valve sclerosis without stenosis or insufficiency.  Marland Kitchen HYPERLIPIDEMIA 04/28/2010  . HYPOTHYROIDISM 04/28/2010  . PROCTITIS 07/23/2008  . TESTICULAR HYPOFUNCTION 04/28/2010  . Unspecified vitamin D deficiency 04/28/2010  . 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 DENTAL SURGERY    . ear injury     piece of ear plug dislodged from ear  . TONSILECTOMY, ADENOIDECTOMY, BILATERAL MYRINGOTOMY AND TUBES  1943  . WISDOM TOOTH EXTRACTION      Social History   Social History  . Marital status: Married    Spouse name: Thornton Papas  . Number of children: 3  . Years of education: Masters   Occupational  History  . 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 Smoking status: Former Research scientist (life sciences)  . Smokeless tobacco: Never Used     Comment: quit 1970  . Alcohol use 0.0 oz/week     Comment: occ.  . Drug use: No  . Sexual activity: Not on file   Other Topics Concern  . Not on file   Social History Narrative   Household: pt and wife   Lost older child years ago     Family History    Problem Relation Age of Onset  . Arthritis Mother   . Heart disease Mother     CHF  . Hypertension Mother   . Arthritis Father   . Stroke Father   . Hypertension Father   . Colon cancer Neg Hx   . Pancreatic cancer Neg Hx   . Stomach cancer Neg Hx   . Prostate cancer Neg Hx     Allergies as of 10/17/2016      Reactions   Peanut-containing Drug Products    Gastrointestinal symptoms   Wasp Venom Hives, Itching      Medication List       Accurate as of 10/17/16  5:44 PM. Always use your most recent med list.          aspirin 81 MG tablet Take 81 mg by mouth daily.   cholecalciferol 1000 units tablet Commonly known as:  VITAMIN D Take 1,000 Units by mouth daily.   diazepam 5 MG tablet Commonly known as:  VALIUM Take 1 tablet (5 mg total) by mouth daily as needed for anxiety.   EPINEPHrine 0.3 mg/0.3 mL Soaj injection Commonly known as:  ADRENACLICK Inject 0.3 mLs (0.3 mg total) into the muscle once. Reported on 10/15/2015   fluticasone 50 MCG/ACT nasal spray Commonly known as:  FLONASE Place 1 spray into both nostrils daily.   HYDROcodone-acetaminophen 5-325 MG tablet Commonly known as:  NORCO/VICODIN Take 1 tablet by mouth every 6 (six) hours as needed for moderate pain. Reported on 10/15/2015   latanoprost 0.005 % ophthalmic solution Commonly known as:  XALATAN Place 1 drop into both eyes at bedtime.   sildenafil 20 MG tablet Commonly known as:  REVATIO Take 3-4 tablets (60-80 mg total) by mouth daily as needed.   timolol 0.5 % ophthalmic solution Commonly known as:  TIMOPTIC          Objective:   Physical Exam BP 128/74 (BP Location: Left Arm, Patient Position: Sitting, Cuff Size: Normal)   Pulse (!) 54   Temp 97.7 F (36.5 C) (Oral)   Resp 14   Ht 5\' 11"  (1.803 m)   Wt 209 lb 6 oz (95 kg)   SpO2 95%   BMI 29.20 kg/m   General:   Well developed, well nourished . NAD.  Neck: No  thyromegaly  HEENT:  Normocephalic . Face symmetric,  atraumatic Lungs:  CTA B Normal respiratory effort, no intercostal retractions, no accessory muscle use. Heart: RRR,  no murmur.  No pretibial edema bilaterally  Abdomen:  Not distended, soft, non-tender. No rebound or rigidity.   Skin: Exposed areas without rash. Not pale. Not jaundice Rectal:  External abnormalities: none. Normal sphincter tone. No rectal masses or tenderness.  No stools found Prostate: Prostate gland firm and smooth, no enlargement, nodularity, tenderness, mass, asymmetry or induration.  Neurologic:  alert & oriented X3.  Speech normal, gait appropriate for age and unassisted Strength symmetric and appropriate for age.  Psych: Cognition  and judgment appear intact.  Cooperative with normal attention span and concentration.  Behavior appropriate. No anxious or depressed appearing.    Assessment & Plan:   Assessment Prediabetes Dyslipidemia Anxiety - on valium rx by pcp, get UDS @ pain mngmt  GI: GERD, IBS, several EGDs before  Allergies : Peanuts, pollen, sunlight, trees,(h/o anal pruritus stopped after peanuts dc from diet) Hypogonadism -- free T wnl 2016  ED, decreased libido MSK: spinal stenosis -- sees Dr Greta Doom , he rx hydrocodone Glaucoma Varicose veins H/o goiter, remote bx H/o low vit d H/p palpitations, normal echo 2008, (-) stress test 2009  PLAN: Prediabetes: Check a CMP. Diet and exercise discussed Dyslipidemia: Diet control, check a FLP Anxiety: Refill Valium today, sees pain management, has a contract w/ them. Dysphagia: As described above, refer to GI, EGD? Will get a CBC Vitamin D deficiency: Check labs ED- RF sildenafil prn RTC 1 year

## 2016-10-17 NOTE — Assessment & Plan Note (Signed)
Prediabetes: Check a CMP. Diet and exercise discussed Dyslipidemia: Diet control, check a FLP Anxiety: Refill Valium today, sees pain management, has a contract w/ them. Dysphagia: As described above, refer to GI, EGD? Will get a CBC Vitamin D deficiency: Check labs ED- RF sildenafil prn RTC 1 year

## 2016-10-18 ENCOUNTER — Other Ambulatory Visit: Payer: Medicare Other

## 2016-10-18 DIAGNOSIS — E559 Vitamin D deficiency, unspecified: Secondary | ICD-10-CM | POA: Diagnosis not present

## 2016-10-18 LAB — COMPREHENSIVE METABOLIC PANEL
ALT: 29 U/L (ref 0–53)
AST: 19 U/L (ref 0–37)
Albumin: 4.3 g/dL (ref 3.5–5.2)
Alkaline Phosphatase: 44 U/L (ref 39–117)
BUN: 15 mg/dL (ref 6–23)
CALCIUM: 9.4 mg/dL (ref 8.4–10.5)
CHLORIDE: 103 meq/L (ref 96–112)
CO2: 30 meq/L (ref 19–32)
CREATININE: 0.89 mg/dL (ref 0.40–1.50)
GFR: 88.46 mL/min (ref >60.00)
Glucose, Bld: 99 mg/dL (ref 70–99)
Potassium: 4.2 mEq/L (ref 3.5–5.1)
SODIUM: 140 meq/L (ref 135–145)
Total Bilirubin: 0.8 mg/dL (ref 0.2–1.2)
Total Protein: 6.7 g/dL (ref 6.0–8.3)

## 2016-10-18 LAB — CBC WITH DIFFERENTIAL/PLATELET
Basophils Absolute: 0.1 10*3/uL (ref 0.0–0.1)
Basophils Relative: 1.2 % (ref 0.0–3.0)
EOS ABS: 0.3 10*3/uL (ref 0.0–0.7)
Eosinophils Relative: 4.4 % (ref 0.0–5.0)
HEMATOCRIT: 42.2 % (ref 39.0–52.0)
Hemoglobin: 14.3 g/dL (ref 13.0–17.0)
LYMPHS PCT: 31.7 % (ref 12.0–46.0)
Lymphs Abs: 2 10*3/uL (ref 0.7–4.0)
MCHC: 33.8 g/dL (ref 30.0–36.0)
MCV: 94.1 fl (ref 78.0–100.0)
MONO ABS: 0.6 10*3/uL (ref 0.1–1.0)
Monocytes Relative: 8.9 % (ref 3.0–12.0)
NEUTROS ABS: 3.4 10*3/uL (ref 1.4–7.7)
Neutrophils Relative %: 53.8 % (ref 43.0–77.0)
PLATELETS: 269 10*3/uL (ref 150.0–400.0)
RBC: 4.48 Mil/uL (ref 4.22–5.81)
RDW: 13.3 % (ref 11.5–15.5)
WBC: 6.2 10*3/uL (ref 4.0–10.5)

## 2016-10-18 LAB — HEMOGLOBIN A1C: Hgb A1c MFr Bld: 5.8 % (ref 4.6–6.5)

## 2016-10-18 LAB — LIPID PANEL
CHOL/HDL RATIO: 4
Cholesterol: 169 mg/dL (ref 0–200)
HDL: 42.3 mg/dL (ref >39.00)
LDL CALC: 112 mg/dL — AB (ref 0–99)
NonHDL: 126.9
TRIGLYCERIDES: 77 mg/dL (ref 0.0–149.0)
VLDL: 15.4 mg/dL (ref 0.0–40.0)

## 2016-10-18 LAB — TSH: TSH: 1.15 u[IU]/mL (ref 0.35–4.50)

## 2016-10-18 LAB — PSA: PSA: 0.68 ng/mL (ref 0.10–4.00)

## 2016-10-22 LAB — VITAMIN D 1,25 DIHYDROXY
VITAMIN D 1, 25 (OH) TOTAL: 35 pg/mL (ref 18–72)
VITAMIN D3 1, 25 (OH): 35 pg/mL

## 2016-10-25 ENCOUNTER — Ambulatory Visit: Payer: Medicare Other | Admitting: Gastroenterology

## 2016-10-26 DIAGNOSIS — G894 Chronic pain syndrome: Secondary | ICD-10-CM | POA: Diagnosis not present

## 2016-10-26 DIAGNOSIS — G4700 Insomnia, unspecified: Secondary | ICD-10-CM | POA: Diagnosis not present

## 2016-10-26 DIAGNOSIS — M4807 Spinal stenosis, lumbosacral region: Secondary | ICD-10-CM | POA: Diagnosis not present

## 2016-10-26 DIAGNOSIS — Z79891 Long term (current) use of opiate analgesic: Secondary | ICD-10-CM | POA: Diagnosis not present

## 2016-10-26 DIAGNOSIS — M62830 Muscle spasm of back: Secondary | ICD-10-CM | POA: Diagnosis not present

## 2017-01-02 DIAGNOSIS — G4700 Insomnia, unspecified: Secondary | ICD-10-CM | POA: Diagnosis not present

## 2017-01-02 DIAGNOSIS — M4807 Spinal stenosis, lumbosacral region: Secondary | ICD-10-CM | POA: Diagnosis not present

## 2017-01-02 DIAGNOSIS — G894 Chronic pain syndrome: Secondary | ICD-10-CM | POA: Diagnosis not present

## 2017-01-02 DIAGNOSIS — M62830 Muscle spasm of back: Secondary | ICD-10-CM | POA: Diagnosis not present

## 2017-01-04 DIAGNOSIS — M4807 Spinal stenosis, lumbosacral region: Secondary | ICD-10-CM | POA: Diagnosis not present

## 2017-02-07 DIAGNOSIS — H35363 Drusen (degenerative) of macula, bilateral: Secondary | ICD-10-CM | POA: Diagnosis not present

## 2017-02-07 DIAGNOSIS — H2513 Age-related nuclear cataract, bilateral: Secondary | ICD-10-CM | POA: Diagnosis not present

## 2017-02-07 DIAGNOSIS — H25013 Cortical age-related cataract, bilateral: Secondary | ICD-10-CM | POA: Diagnosis not present

## 2017-02-07 DIAGNOSIS — H401131 Primary open-angle glaucoma, bilateral, mild stage: Secondary | ICD-10-CM | POA: Diagnosis not present

## 2017-03-23 DIAGNOSIS — I83893 Varicose veins of bilateral lower extremities with other complications: Secondary | ICD-10-CM | POA: Diagnosis not present

## 2017-04-09 DIAGNOSIS — M62830 Muscle spasm of back: Secondary | ICD-10-CM | POA: Diagnosis not present

## 2017-04-09 DIAGNOSIS — G4700 Insomnia, unspecified: Secondary | ICD-10-CM | POA: Diagnosis not present

## 2017-04-09 DIAGNOSIS — M4807 Spinal stenosis, lumbosacral region: Secondary | ICD-10-CM | POA: Diagnosis not present

## 2017-04-09 DIAGNOSIS — G894 Chronic pain syndrome: Secondary | ICD-10-CM | POA: Diagnosis not present

## 2017-05-08 DIAGNOSIS — M62830 Muscle spasm of back: Secondary | ICD-10-CM | POA: Diagnosis not present

## 2017-05-08 DIAGNOSIS — G4700 Insomnia, unspecified: Secondary | ICD-10-CM | POA: Diagnosis not present

## 2017-05-08 DIAGNOSIS — G894 Chronic pain syndrome: Secondary | ICD-10-CM | POA: Diagnosis not present

## 2017-05-08 DIAGNOSIS — M4807 Spinal stenosis, lumbosacral region: Secondary | ICD-10-CM | POA: Diagnosis not present

## 2017-06-04 DIAGNOSIS — M4807 Spinal stenosis, lumbosacral region: Secondary | ICD-10-CM | POA: Diagnosis not present

## 2017-06-22 ENCOUNTER — Ambulatory Visit: Payer: Medicare Other | Admitting: Internal Medicine

## 2017-06-26 ENCOUNTER — Ambulatory Visit (INDEPENDENT_AMBULATORY_CARE_PROVIDER_SITE_OTHER): Payer: Medicare Other

## 2017-06-26 DIAGNOSIS — Z23 Encounter for immunization: Secondary | ICD-10-CM | POA: Diagnosis not present

## 2017-07-10 DIAGNOSIS — G4700 Insomnia, unspecified: Secondary | ICD-10-CM | POA: Diagnosis not present

## 2017-07-10 DIAGNOSIS — M62830 Muscle spasm of back: Secondary | ICD-10-CM | POA: Diagnosis not present

## 2017-07-10 DIAGNOSIS — G894 Chronic pain syndrome: Secondary | ICD-10-CM | POA: Diagnosis not present

## 2017-07-10 DIAGNOSIS — M4807 Spinal stenosis, lumbosacral region: Secondary | ICD-10-CM | POA: Diagnosis not present

## 2017-07-25 DIAGNOSIS — H40021 Open angle with borderline findings, high risk, right eye: Secondary | ICD-10-CM | POA: Diagnosis not present

## 2017-07-25 DIAGNOSIS — H401121 Primary open-angle glaucoma, left eye, mild stage: Secondary | ICD-10-CM | POA: Diagnosis not present

## 2017-07-27 ENCOUNTER — Other Ambulatory Visit: Payer: Self-pay | Admitting: Internal Medicine

## 2017-07-27 NOTE — Telephone Encounter (Signed)
Rx printed, awaiting MD signature.  

## 2017-07-27 NOTE — Telephone Encounter (Signed)
Okay #90, 1 refill 

## 2017-07-27 NOTE — Telephone Encounter (Signed)
Rx faxed to [facility]  

## 2017-07-27 NOTE — Telephone Encounter (Signed)
Pt is requesting refill on diazepam 5mg .  Last OV: 10/17/2016 Last Fill: 10/17/2016 #90 and 1 RF UDS: None  NCCR printed; no issues noted.  Please advise.

## 2017-08-22 ENCOUNTER — Other Ambulatory Visit: Payer: Self-pay | Admitting: Internal Medicine

## 2017-09-11 HISTORY — PX: BACK SURGERY: SHX140

## 2017-10-02 DIAGNOSIS — M62830 Muscle spasm of back: Secondary | ICD-10-CM | POA: Diagnosis not present

## 2017-10-02 DIAGNOSIS — G894 Chronic pain syndrome: Secondary | ICD-10-CM | POA: Diagnosis not present

## 2017-10-02 DIAGNOSIS — G4700 Insomnia, unspecified: Secondary | ICD-10-CM | POA: Diagnosis not present

## 2017-10-02 DIAGNOSIS — M4807 Spinal stenosis, lumbosacral region: Secondary | ICD-10-CM | POA: Diagnosis not present

## 2017-10-12 NOTE — Progress Notes (Addendum)
Subjective:   Tyrone Diaz is a 77 y.o. male who presents for Medicare Annual/Subsequent preventive examination.  Review of Systems: No ROS.  Medicare Wellness Visit. Additional risk factors are reflected in the social history. Cardiac Risk Factors include: advanced age (>78men, >79 women);male gender Sleep patterns: sleeps 4 hrs, wakes for 2 hrs, sleeps another 4 hrs. Feels rested. Home Safety/Smoke Alarms: Feels safe in home. Smoke alarms in place.  Living environment; residence and Adult nurse: lives with wife. 1 story.  Seat Belt Safety/Bike Helmet: Wears seat belt.  Male:   CCS-  cologuard 10/28/14-negative. Pt will need next ordered 10/29/17. PSA-  Lab Results  Component Value Date   PSA 0.68 10/18/2016   PSA 0.71 10/14/2014   PSA 0.70 05/02/2011       Objective:    Vitals: BP 138/78 (BP Location: Left Arm, Patient Position: Sitting, Cuff Size: Normal)   Pulse 60   Wt 209 lb 9.6 oz (95.1 kg)   SpO2 98%   BMI 29.23 kg/m   Body mass index is 29.23 kg/m.  Advanced Directives 10/19/2017  Does Patient Have a Medical Advance Directive? Yes  Type of Paramedic of [place];Living will  Does patient want to make changes to medical advance directive? No - Patient declined  Copy of [place] in Chart? No - copy requested    Tobacco Social History   Tobacco Use  Smoking Status Former Smoker  Smokeless Tobacco Never Used  Tobacco Comment   quit 1970     Counseling given: Not Answered Comment: quit 1970   Clinical Intake: Pain : No/denies pain   Past Medical History:  Diagnosis Date  . ANEMIA   . ANXIETY   . ARTHRITIS, CERVICAL SPINE   . Cataracts, bilateral   . GERD   . GLAUCOMA, BILATERAL   . History of stress test 12/2001   Showed normal perfusion with mild apical thinning.  Marland Kitchen History of stress test 11/2007   Post stress myocardial perfusion images show a normal pattern of perfusion in the all regions, the  post stress left ventricular is normal in size. There is no scintigraphic evidence of inducible myocardial ischemia. The post EF 64%  . Hx of echocardiogram 12/2001   Had previously shown mild MR as well as mild TR with normal systolic function.  Marland Kitchen Hx of echocardiogram 11/2006   5-years follow-up, this showed normal systolic as well as diastolic function. There was suggestion of flat mitral valve leaflet coaptation with borderline anterior mitral valve leaflet prolapse with mild associated mitral valve prolapse. He had mild TR. There was very mild aortic valve sclerosis without stenosis or insufficiency.  Marland Kitchen HYPERLIPIDEMIA   . HYPOTHYROIDISM   . PROCTITIS   . TESTICULAR HYPOFUNCTION   . Unspecified vitamin D deficiency   . VARICOSE VEINS, LOWER EXTREMITIES    Past Surgical History:  Procedure Laterality Date  . DENTAL SURGERY    . ear injury     piece of ear plug dislodged from ear  . TONSILECTOMY, ADENOIDECTOMY, BILATERAL MYRINGOTOMY AND TUBES  1943  . WISDOM TOOTH EXTRACTION     Family History  Problem Relation Age of Onset  . Arthritis Mother   . Heart disease Mother        CHF  . Hypertension Mother   . Arthritis Father   . Stroke Father   . Hypertension Father   . Colon cancer Neg Hx   . Pancreatic cancer Neg Hx   .  Stomach cancer Neg Hx   . Prostate cancer Neg Hx    Social History   Socioeconomic History  . Marital status: Married    Spouse name: Thornton Papas  . Number of children: 3  . Years of education: Masters  . Highest education level: None  Social Needs  . Financial resource strain: None  . Food insecurity - worry: None  . Food insecurity - inability: None  . Transportation needs - medical: None  . Transportation needs - non-medical: None  Occupational History  . Occupation: retired 2010, Training and development officer: RETIRED  Tobacco Use  . Smoking status: Former Research scientist (life sciences)  . Smokeless tobacco: Never Used  . Tobacco comment: quit 1970  Substance and Sexual  Activity  . Alcohol use: Yes    Alcohol/week: 0.0 oz    Comment: wine or liquer 2 glasses per night  . Drug use: No  . Sexual activity: Yes  Other Topics Concern  . None  Social History Narrative   Household: pt and wife   Lost older child years ago    Outpatient Encounter Medications as of 10/19/2017  Medication Sig  . cholecalciferol (VITAMIN D) 1000 units tablet Take 1,000 Units by mouth daily.  . diazepam (VALIUM) 5 MG tablet Take 1 tablet (5 mg total) daily as needed by mouth for anxiety.  Marland Kitchen EPINEPHrine (ADRENACLICK) 0.3 FG/1.8 mL IJ SOAJ injection Inject 0.3 mLs (0.3 mg total) into the muscle once. Reported on 10/15/2015  . fluticasone (FLONASE) 50 MCG/ACT nasal spray Place 1 spray into both nostrils daily.  Marland Kitchen HYDROcodone-acetaminophen (NORCO/VICODIN) 5-325 MG per tablet Take 1 tablet by mouth every 6 (six) hours as needed for moderate pain. Reported on 10/15/2015  . latanoprost (XALATAN) 0.005 % ophthalmic solution Place 1 drop into both eyes at bedtime.    . sildenafil (REVATIO) 20 MG tablet Take 3-4 tablets (60-80 mg total) by mouth daily as needed.  . timolol (TIMOPTIC) 0.5 % ophthalmic solution   . aspirin 81 MG tablet Take 81 mg by mouth daily.     No facility-administered encounter medications on file as of 10/19/2017.     Activities of Daily Living In your present state of health, do you have any difficulty performing the following activities: 10/19/2017  Hearing? N  Vision? N  Comment Dr.Hecker every 6 months. Follows for glaucoma. Wearing glasses.  Difficulty concentrating or making decisions? N  Walking or climbing stairs? N  Dressing or bathing? N  Doing errands, shopping? N  Preparing Food and eating ? N  Using the Toilet? N  In the past six months, have you accidently leaked urine? N  Do you have problems with loss of bowel control? N  Managing your Medications? N  Managing your Finances? N  Housekeeping or managing your Housekeeping? N  Some recent data might be  hidden    Patient Care Team: Colon Branch, MD as PCP - General (Internal Medicine) Erlene Quan, PA-C as Physician Assistant (Cardiology) Inda Castle, MD (Inactive) as Consulting Physician (Gastroenterology) Monna Fam, MD as Consulting Physician (Ophthalmology) Margaretha Sheffield, MD as Referring Physician (Physical Medicine and Rehabilitation)   Assessment:   This is a routine wellness examination for Tameron. Physical assessment deferred to PCP.   Exercise Activities and Dietary recommendations Current Exercise Habits: Home exercise routine, Type of exercise: strength training/weights;treadmill, Time (Minutes): 60, Frequency (Times/Week): 7, Weekly Exercise (Minutes/Week): 420, Intensity: Moderate Diet (meal preparation, eat out, water intake, caffeinated beverages, dairy products, fruits and vegetables): in  general, a "healthy" diet  , well balanced  Goals    . Patient Stated     [name] current healthy active lifestyle.        Fall Risk Fall Risk  10/19/2017 10/17/2016 10/15/2015 04/13/2015 10/13/2014  Falls in the past year? No No No No No  Number falls in past yr: - - - - -  Comment - - - - -  Injury with Fall? - - - - -  Comment - - - - -   Depression Screen PHQ 2/9 Scores 10/19/2017 10/17/2016 10/15/2015 04/13/2015  PHQ - 2 Score 0 0 0 0    Cognitive Function Ad8 score reviewed for issues:  Issues making decisions:no  Less interest in hobbies / activities:no  Repeats questions, stories (family complaining):no  Trouble using ordinary gadgets (microwave, computer, phone):no  Forgets the month or year: no  Mismanaging finances: no  Remembering appts:no  Daily problems with thinking and/or memory:no Ad8 score is=0        Immunization History  Administered Date(s) Administered  . Influenza Split 08/05/2012  . Influenza, High Dose Seasonal PF 08/31/2015, 08/01/2016, 06/26/2017  . Influenza,inj,Quad PF,6+ Mos 05/28/2013, 10/13/2014  . Pneumococcal  Conjugate-13 10/13/2014  . Pneumococcal Polysaccharide-23 04/28/2010  . Td 10/17/2016  . Tdap 01/09/2006  . Zoster 04/28/2010    Screening Tests Health Maintenance  Topic Date Due  . TETANUS/TDAP  10/17/2026  . INFLUENZA VACCINE  Completed  . PNA vac Low Risk Adult  Completed       Plan:   Please schedule appointment for yearly follow up with Dr.Paz.  Continue to eat heart healthy diet (full of fruits, vegetables, whole grains, lean protein, water--limit salt, fat, and sugar intake) and increase physical activity as tolerated.  Continue doing brain stimulating activities (puzzles, reading, adult coloring books, staying active) to keep memory sharp.     I have personally reviewed and noted the following in the patient's chart:   . Medical and social history . Use of alcohol, tobacco or illicit drugs  . Current medications and supplements . Functional ability and status . Nutritional status . Physical activity . Advanced directives . List of other physicians . Hospitalizations, surgeries, and ER visits in previous 12 months . Vitals . Screenings to include cognitive, depression, and falls . Referrals and appointments  In addition, I have reviewed and discussed with patient certain preventive protocols, quality metrics, and best practice recommendations. A written personalized care plan for preventive services as well as general preventive health recommendations were provided to patient.     Naaman Plummer [place], [place]  10/19/2017 Kathlene November, MD

## 2017-10-19 ENCOUNTER — Encounter: Payer: Self-pay | Admitting: *Deleted

## 2017-10-19 ENCOUNTER — Ambulatory Visit (INDEPENDENT_AMBULATORY_CARE_PROVIDER_SITE_OTHER): Payer: Medicare Other | Admitting: *Deleted

## 2017-10-19 ENCOUNTER — Ambulatory Visit: Payer: Medicare Other | Admitting: Internal Medicine

## 2017-10-19 VITALS — BP 138/78 | HR 60 | Wt 209.6 lb

## 2017-10-19 DIAGNOSIS — Z0000 Encounter for general adult medical examination without abnormal findings: Secondary | ICD-10-CM

## 2017-10-19 NOTE — Patient Instructions (Signed)
Please schedule appointment for yearly follow up with Dr.Paz.  Continue to eat heart healthy diet (full of fruits, vegetables, whole grains, lean protein, water--limit salt, fat, and sugar intake) and increase physical activity as tolerated.  Continue doing brain stimulating activities (puzzles, reading, adult coloring books, staying active) to keep memory sharp.    Tyrone Diaz , Thank you for taking time to come for your Medicare Wellness Visit. I appreciate your ongoing commitment to your health goals. Please review the following plan we discussed and let me know if I can assist you in the future.   These are the goals we discussed: Goals    . Patient Stated     Tyrone Diaz current healthy active lifestyle.        This is a list of the screening recommended for you and due dates:  Health Maintenance  Topic Date Due  . Tetanus Vaccine  10/17/2026  . Flu Shot  Completed  . Pneumonia vaccines  Completed    Health Maintenance, Male A healthy lifestyle and preventive care is important for your health and wellness. Ask your health care provider about what schedule of regular examinations is right for you. What should I know about weight and diet? Eat a Healthy Diet  Eat plenty of vegetables, fruits, whole grains, low-fat dairy products, and lean protein.  Do not eat a lot of foods high in solid fats, added sugars, or salt.  Maintain a Healthy Weight Regular exercise can help you achieve or maintain a healthy weight. You should:  Do at least 150 minutes of exercise each week. The exercise should increase your heart rate and make you sweat (moderate-intensity exercise).  Do strength-training exercises at least twice a week.  Watch Your Levels of Cholesterol and Blood Lipids  Have your blood tested for lipids and cholesterol every 5 years starting at 77 years of age. If you are at high risk for heart disease, you should start having your blood tested when you are 77 years old. You  may need to have your cholesterol levels checked more often if: ? Your lipid or cholesterol levels are high. ? You are older than 77 years of age. ? You are at high risk for heart disease.  What should I know about cancer screening? Many types of cancers can be detected early and may often be prevented. Lung Cancer  You should be screened every year for lung cancer if: ? You are a current smoker who has smoked for at least 30 years. ? You are a former smoker who has quit within the past 15 years.  Talk to your health care provider about your screening options, when you should start screening, and how often you should be screened.  Colorectal Cancer  Routine colorectal cancer screening usually begins at 77 years of age and should be repeated every 5-10 years until you are 77 years old. You may need to be screened more often if early forms of precancerous polyps or small growths are found. Your health care provider may recommend screening at an earlier age if you have risk factors for colon cancer.  Your health care provider may recommend using home test kits to check for hidden blood in the stool.  A small camera at the end of a tube can be used to examine your colon (sigmoidoscopy or colonoscopy). This checks for the earliest forms of colorectal cancer.  Prostate and Testicular Cancer  Depending on your age and overall health, your health care provider may do  certain tests to screen for prostate and testicular cancer.  Talk to your health care provider about any symptoms or concerns you have about testicular or prostate cancer.  Skin Cancer  Check your skin from head to toe regularly.  Tell your health care provider about any new moles or changes in moles, especially if: ? There is a change in a mole's size, shape, or color. ? You have a mole that is larger than a pencil eraser.  Always use sunscreen. Apply sunscreen liberally and repeat throughout the day.  Protect yourself by  wearing long sleeves, pants, a wide-brimmed hat, and sunglasses when outside.  What should I know about heart disease, diabetes, and high blood pressure?  If you are 43-76 years of age, have your blood pressure checked every 3-5 years. If you are 62 years of age or older, have your blood pressure checked every year. You should have your blood pressure measured twice-once when you are at a hospital or clinic, and once when you are not at a hospital or clinic. Record the average of the two measurements. To check your blood pressure when you are not at a hospital or clinic, you can use: ? An automated blood pressure machine at a pharmacy. ? A home blood pressure monitor.  Talk to your health care provider about your target blood pressure.  If you are between 20-69 years old, ask your health care provider if you should take aspirin to prevent heart disease.  Have regular diabetes screenings by checking your fasting blood sugar level. ? If you are at a normal weight and have a low risk for diabetes, have this test once every three years after the age of 64. ? If you are overweight and have a high risk for diabetes, consider being tested at a younger age or more often.  A one-time screening for abdominal aortic aneurysm (AAA) by ultrasound is recommended for men aged 48-75 years who are current or former smokers. What should I know about preventing infection? Hepatitis B If you have a higher risk for hepatitis B, you should be screened for this virus. Talk with your health care provider to find out if you are at risk for hepatitis B infection. Hepatitis C Blood testing is recommended for:  Everyone born from [birth year] through 1965.  Anyone with known risk factors for hepatitis C.  Sexually Transmitted Diseases (STDs)  You should be screened each year for STDs including gonorrhea and chlamydia if: ? You are sexually active and are younger than 77 years of age. ? You are older than 77 years of age  and your health care provider tells you that you are at risk for this type of infection. ? Your sexual activity has changed since you were last screened and you are at an increased risk for chlamydia or gonorrhea. Ask your health care provider if you are at risk.  Talk with your health care provider about whether you are at high risk of being infected with HIV. Your health care provider may recommend a prescription medicine to help prevent HIV infection.  What else can I do?  Schedule regular health, dental, and eye exams.  Stay current with your vaccines (immunizations).  Do not use any tobacco products, such as cigarettes, chewing tobacco, and e-cigarettes. If you need help quitting, ask your health care provider.  Limit alcohol intake to no more than 2 drinks per day. One drink equals 12 ounces of beer, 5 ounces of wine, or 1 ounces of  hard liquor.  Do not use street drugs.  Do not share needles.  Ask your health care provider for help if you need support or information about quitting drugs.  Tell your health care provider if you often feel depressed.  Tell your health care provider if you have ever been abused or do not feel safe at home. This information is not intended to replace advice given to you by your health care provider. Make sure you discuss any questions you have with your health care provider. Document Released: 02/24/2008 Document Revised: 04/26/2016 Document Reviewed: 06/01/2015 Elsevier Interactive Patient Education  [employer].

## 2017-10-24 ENCOUNTER — Ambulatory Visit: Payer: Medicare Other | Admitting: Internal Medicine

## 2017-10-30 ENCOUNTER — Encounter: Payer: Self-pay | Admitting: Internal Medicine

## 2017-10-30 ENCOUNTER — Ambulatory Visit (INDEPENDENT_AMBULATORY_CARE_PROVIDER_SITE_OTHER): Payer: Medicare Other | Admitting: Internal Medicine

## 2017-10-30 ENCOUNTER — Telehealth: Payer: Self-pay

## 2017-10-30 VITALS — BP 116/66 | HR 66 | Temp 97.9°F | Resp 14 | Ht 71.0 in | Wt 211.4 lb

## 2017-10-30 DIAGNOSIS — Z1329 Encounter for screening for other suspected endocrine disorder: Secondary | ICD-10-CM | POA: Diagnosis not present

## 2017-10-30 DIAGNOSIS — Z79899 Other long term (current) drug therapy: Secondary | ICD-10-CM

## 2017-10-30 DIAGNOSIS — R739 Hyperglycemia, unspecified: Secondary | ICD-10-CM

## 2017-10-30 DIAGNOSIS — F411 Generalized anxiety disorder: Secondary | ICD-10-CM

## 2017-10-30 DIAGNOSIS — E785 Hyperlipidemia, unspecified: Secondary | ICD-10-CM

## 2017-10-30 DIAGNOSIS — R911 Solitary pulmonary nodule: Secondary | ICD-10-CM

## 2017-10-30 DIAGNOSIS — E559 Vitamin D deficiency, unspecified: Secondary | ICD-10-CM | POA: Diagnosis not present

## 2017-10-30 MED ORDER — EPINEPHRINE 0.3 MG/0.3ML IJ SOAJ: 0.3000 mg | Freq: Once | INTRAMUSCULAR | 1 refills | Status: DC | PRN

## 2017-10-30 MED ORDER — DIAZEPAM 5 MG PO TABS: 5.0000 mg | ORAL_TABLET | Freq: Every day | ORAL | 1 refills | Status: AC | PRN

## 2017-10-30 MED ORDER — SILDENAFIL CITRATE 20 MG PO TABS: 60.0000 mg | ORAL_TABLET | Freq: Every day | ORAL | 6 refills | Status: DC | PRN

## 2017-10-30 NOTE — Progress Notes (Signed)
Subjective:    Patient ID: Tyrone Diaz, male    DOB: June 08, 1941, 77 y.o.   MRN: [id number]  DOS:  10/30/2017 Type of visit - description : f/u Interval history: No major concerns. Request a chest x-ray due to remote history of pulmonary nodule. Vitamin D deficiency, on OTCs, request lab Anxiety: Well controlled with diazepam, usually takes 1 tablet daily ED: On sildenafil, request refills. Allergies: Currently well controlled, needs a refill on EpiPen.  Will do. Self DC aspirin.   Review of Systems Denies nausea, vomiting, diarrhea. Denies wheezing, occasionally cough, in the mornings sees sometimes a small amount of sputum. Denies chest pain, difficulty breathing or palpitations.   Past Medical History:  Diagnosis Date  . ANEMIA   . ANXIETY   . ARTHRITIS, CERVICAL SPINE   . Cataracts, bilateral   . GERD   . GLAUCOMA, BILATERAL   . History of stress test 12/2001   Showed normal perfusion with mild apical thinning.  Marland Kitchen History of stress test 11/2007   Post stress myocardial perfusion images show a normal pattern of perfusion in the all regions, the post stress left ventricular is normal in size. There is no scintigraphic evidence of inducible myocardial ischemia. The post EF 64%  . Hx of echocardiogram 12/2001   Had previously shown mild MR as well as mild TR with normal systolic function.  Marland Kitchen Hx of echocardiogram 11/2006   5-years follow-up, this showed normal systolic as well as diastolic function. There was suggestion of flat mitral valve leaflet coaptation with borderline anterior mitral valve leaflet prolapse with mild associated mitral valve prolapse. He had mild TR. There was very mild aortic valve sclerosis without stenosis or insufficiency.  Marland Kitchen HYPERLIPIDEMIA   . HYPOTHYROIDISM   . PROCTITIS   . TESTICULAR HYPOFUNCTION   . Unspecified vitamin D deficiency   . VARICOSE VEINS, LOWER EXTREMITIES     Past Surgical History:  Procedure Laterality Date  . DENTAL  SURGERY    . ear injury     piece of ear plug dislodged from ear  . TONSILECTOMY, ADENOIDECTOMY, BILATERAL MYRINGOTOMY AND TUBES  1943  . WISDOM TOOTH EXTRACTION      Social History   Socioeconomic History  . Marital status: Married    Spouse name: Thornton Papas  . Number of children: 3  . Years of education: Masters  . Highest education level: Not on file  Social Needs  . Financial resource strain: Not on file  . Food insecurity - worry: Not on file  . Food insecurity - inability: Not on file  . Transportation needs - medical: Not on file  . Transportation needs - non-medical: Not on file  Occupational History  . Occupation: retired 2010, Training and development officer: RETIRED  Tobacco Use  . Smoking status: Former Research scientist (life sciences)  . Smokeless tobacco: Never Used  . Tobacco comment: quit 1970  Substance and Sexual Activity  . Alcohol use: Yes    Alcohol/week: 0.0 oz    Comment: wine or liquer 2 glasses per night  . Drug use: No  . Sexual activity: Yes  Other Topics Concern  . Not on file  Social History Narrative   Household: pt and wife   Lost older child years ago      Allergies as of 10/30/2017      Reactions   Peanut-containing Drug Products    Gastrointestinal symptoms   Wasp Venom Hives, Itching      Medication List  Accurate as of 10/30/17 11:59 PM. Always use your most recent med list.          cholecalciferol 1000 units tablet Commonly known as:  VITAMIN D Take 1,000 Units by mouth daily.   diazepam 5 MG tablet Commonly known as:  VALIUM Take 1 tablet (5 mg total) by mouth daily as needed for anxiety.   EPINEPHrine 0.3 mg/0.3 mL Soaj injection Commonly known as:  ADRENACLICK Inject 0.3 mLs (0.3 mg total) into the muscle once as needed for up to 1 dose.   fluticasone 50 MCG/ACT nasal spray Commonly known as:  FLONASE Place 1 spray into both nostrils daily.   HYDROcodone-acetaminophen 5-325 MG tablet Commonly known as:  NORCO/VICODIN Take 1  tablet by mouth every 6 (six) hours as needed for moderate pain. Reported on 10/15/2015   latanoprost 0.005 % ophthalmic solution Commonly known as:  XALATAN Place 1 drop into both eyes at bedtime.   sildenafil 20 MG tablet Commonly known as:  REVATIO Take 3-4 tablets (60-80 mg total) by mouth daily as needed.   timolol 0.5 % ophthalmic solution Commonly known as:  TIMOPTIC          Objective:   Physical Exam BP 116/66 (BP Location: Left Arm, Patient Position: Sitting, Cuff Size: Small)   Pulse 66   Temp 97.9 F (36.6 C) (Oral)   Resp 14   Ht 5\' 11"  (1.803 m)   Wt 211 lb 6 oz (95.9 kg)   SpO2 97%   BMI 29.48 kg/m   General:   Well developed, well nourished . NAD.  Neck: No  thyromegaly  HEENT:  Normocephalic . Face symmetric, atraumatic Lungs:  CTA B Normal respiratory effort, no intercostal retractions, no accessory muscle use. Heart: RRR,  no murmur.  No pretibial edema bilaterally  Abdomen:  Not distended, soft, non-tender. No rebound or rigidity.   Skin: Exposed areas without rash. Not pale. Not jaundice Neurologic:  alert & oriented X3.  Speech normal, gait appropriate for age and unassisted Strength symmetric and appropriate for age.  Psych: Cognition and judgment appear intact.  Cooperative with normal attention span and concentration.  Behavior appropriate. No anxious or depressed appearing.     Assessment & Plan:    Assessment Prediabetes Dyslipidemia Anxiety - on valium rx by pcp, get UDS @ pain mngmt  GI: GERD, IBS, several EGDs before  Allergies : Peanuts, pollen, sunlight, trees,(h/o anal pruritus stopped after peanuts dc from diet) Hypogonadism -- free T wnl 2016  ED, decreased libido MSK: spinal stenosis -- sees Dr Greta Doom , he rx hydrocodone Glaucoma Varicose veins H/o goiter, remote bx H/o low vit d H/p palpitations, normal echo 2008, (-) stress test 2009  PLAN: Preventive care reviewed   Prediabetes: Diet needs improvement, he  remains active.  Counseled, check a CMP, A1c Hyperlipidemia : Diet controlled, checking labs Anxiety: On Valium, take it most days, UDS and contract today.  He gets hydrocodone from his pain management doctor H/o  GERD, IBS: Essentially asx Allergies: Controlled on Flonase, Claritin as needed.  Request a EpiPen, will refill. Low vitamin D: On supplements daily, checking labs.   Pulmonary nodule: Remotely, seen on a chest x-ray in 2009, request recheck.  Will do Requested TSH.  Will do.  May not be covered. RTC 1 year

## 2017-10-30 NOTE — Patient Instructions (Addendum)
  GO TO THE FRONT DESK Schedule your next appointment for a general checkup in 1 year  Schedule labs to be done fasting in the next few days   STOP BY THE FIRST FLOOR:  get the XR    GENERAL INFORMATION ABOUT HEALTHY EATING:  The American Heart Association, www.heart.org  Check the "Life's Simple 7" from the [facility] The American diabetes Association www.diabetes.org  "[facility]" book

## 2017-10-30 NOTE — Progress Notes (Signed)
Pre visit review using our clinic review tool, if applicable. No additional management support is needed unless otherwise documented below in the visit note. 

## 2017-10-30 NOTE — Telephone Encounter (Signed)
Cologuard ordered through [employer] portal. Order ID number: [id number].

## 2017-10-30 NOTE — Assessment & Plan Note (Addendum)
-  Td 2018; pnm 23: 2011; prevnar 2016, had a flu shot 06-2017. zostavax 2011; shingrex not available -Prostate cancer screening: DRE/PSA  2018 normal -CCS:  Colonoscopy 2002: Diverticuli, polyp: BX  benign polypoid colonic mucosa. See phone note from 10/14/2014, we agreed to proceed with a cologuard: (-) 10-2014. Re check a cologuard

## 2017-10-31 NOTE — Assessment & Plan Note (Signed)
Preventive care reviewed   Prediabetes: Diet needs improvement, he remains active.  Counseled, check a CMP, A1c Hyperlipidemia : Diet controlled, checking labs Anxiety: On Valium, take it most days, UDS and contract today.  He gets hydrocodone from his pain management doctor H/o  GERD, IBS: Essentially asx Allergies: Controlled on Flonase, Claritin as needed.  Request a EpiPen, will refill. Low vitamin D: On supplements daily, checking labs.   Pulmonary nodule: Remotely, seen on a chest x-ray in 2009, request recheck.  Will do Requested TSH.  Will do.  May not be covered. RTC 1 year

## 2017-11-01 ENCOUNTER — Ambulatory Visit (HOSPITAL_BASED_OUTPATIENT_CLINIC_OR_DEPARTMENT_OTHER)
Admission: RE | Admit: 2017-11-01 | Discharge: 2017-11-01 | Disposition: A | Discharge status: Home / Self Care | Payer: Medicare Other | Source: Ambulatory Visit | Attending: Internal Medicine | Admitting: Internal Medicine

## 2017-11-01 ENCOUNTER — Other Ambulatory Visit (INDEPENDENT_AMBULATORY_CARE_PROVIDER_SITE_OTHER): Payer: Medicare Other

## 2017-11-01 DIAGNOSIS — R911 Solitary pulmonary nodule: Secondary | ICD-10-CM | POA: Insufficient documentation

## 2017-11-01 DIAGNOSIS — J984 Other disorders of lung: Secondary | ICD-10-CM | POA: Insufficient documentation

## 2017-11-01 DIAGNOSIS — E785 Hyperlipidemia, unspecified: Secondary | ICD-10-CM | POA: Diagnosis not present

## 2017-11-01 DIAGNOSIS — Z79899 Other long term (current) drug therapy: Secondary | ICD-10-CM | POA: Diagnosis not present

## 2017-11-01 DIAGNOSIS — R739 Hyperglycemia, unspecified: Secondary | ICD-10-CM

## 2017-11-01 DIAGNOSIS — F411 Generalized anxiety disorder: Secondary | ICD-10-CM | POA: Diagnosis not present

## 2017-11-01 DIAGNOSIS — I700 Atherosclerosis of aorta: Secondary | ICD-10-CM | POA: Diagnosis not present

## 2017-11-01 DIAGNOSIS — E559 Vitamin D deficiency, unspecified: Secondary | ICD-10-CM | POA: Diagnosis not present

## 2017-11-01 DIAGNOSIS — R918 Other nonspecific abnormal finding of lung field: Secondary | ICD-10-CM | POA: Insufficient documentation

## 2017-11-01 DIAGNOSIS — J948 Other specified pleural conditions: Secondary | ICD-10-CM | POA: Diagnosis not present

## 2017-11-01 DIAGNOSIS — Z1329 Encounter for screening for other suspected endocrine disorder: Secondary | ICD-10-CM

## 2017-11-01 LAB — HEMOGLOBIN A1C: HEMOGLOBIN A1C: 5.6 % (ref 4.6–6.5)

## 2017-11-01 LAB — COMPREHENSIVE METABOLIC PANEL
ALBUMIN: 4.2 g/dL (ref 3.5–5.2)
ALT: 30 U/L (ref 0–53)
AST: 21 U/L (ref 0–37)
Alkaline Phosphatase: 46 U/L (ref 39–117)
BUN: 12 mg/dL (ref 6–23)
CALCIUM: 9.5 mg/dL (ref 8.4–10.5)
CHLORIDE: 102 meq/L (ref 96–112)
CO2: 33 mEq/L — ABNORMAL HIGH (ref 19–32)
Creatinine, Ser: 0.91 mg/dL (ref 0.40–1.50)
GFR: 85.98 mL/min (ref >60.00)
Glucose, Bld: 106 mg/dL — ABNORMAL HIGH (ref 70–99)
POTASSIUM: 4.1 meq/L (ref 3.5–5.1)
Sodium: 141 mEq/L (ref 135–145)
Total Bilirubin: 0.8 mg/dL (ref 0.2–1.2)
Total Protein: 7.1 g/dL (ref 6.0–8.3)

## 2017-11-01 LAB — LIPID PANEL
CHOL/HDL RATIO: 3
CHOLESTEROL: 176 mg/dL (ref 0–200)
HDL: 50.7 mg/dL (ref >39.00)
LDL CALC: 111 mg/dL — AB (ref 0–99)
NonHDL: 125.14
TRIGLYCERIDES: 73 mg/dL (ref 0.0–149.0)
VLDL: 14.6 mg/dL (ref 0.0–40.0)

## 2017-11-06 LAB — PAIN MGMT, PROFILE 8 W/CONF, U
6 Acetylmorphine: NEGATIVE ng/mL (ref <10)
AMINOCLONAZEPAM: NEGATIVE ng/mL (ref <25)
AMPHETAMINES: NEGATIVE ng/mL (ref <500)
Alcohol Metabolites: POSITIVE ng/mL — AB (ref <500)
Alphahydroxyalprazolam: NEGATIVE ng/mL (ref <25)
Alphahydroxymidazolam: NEGATIVE ng/mL (ref <50)
Alphahydroxytriazolam: NEGATIVE ng/mL (ref <50)
BENZODIAZEPINES: POSITIVE ng/mL — AB (ref <100)
BUPRENORPHINE, URINE: NEGATIVE ng/mL (ref <5)
CREATININE: 92.6 mg/dL
Cocaine Metabolite: NEGATIVE ng/mL (ref <150)
Codeine: NEGATIVE ng/mL (ref <50)
Ethyl Glucuronide (ETG): 81979 ng/mL — ABNORMAL HIGH (ref <500)
Ethyl Sulfate (ETS): 14252 ng/mL — ABNORMAL HIGH (ref <100)
HYDROCODONE: 448 ng/mL — AB (ref <50)
Hydromorphone: 433 ng/mL — ABNORMAL HIGH (ref <50)
Hydroxyethylflurazepam: NEGATIVE ng/mL (ref <50)
Lorazepam: NEGATIVE ng/mL (ref <50)
MDMA: NEGATIVE ng/mL (ref <500)
MORPHINE: NEGATIVE ng/mL (ref <50)
Marijuana Metabolite: NEGATIVE ng/mL (ref <20)
Nordiazepam: 260 ng/mL — ABNORMAL HIGH (ref <50)
Norhydrocodone: 495 ng/mL — ABNORMAL HIGH (ref <50)
OXIDANT: NEGATIVE ug/mL (ref <200)
Opiates: POSITIVE ng/mL — AB (ref <100)
Oxazepam: 260 ng/mL — ABNORMAL HIGH (ref <50)
Oxycodone: NEGATIVE ng/mL (ref <100)
PH: 6.98 (ref 4.5–9.0)
TEMAZEPAM: 347 ng/mL — AB (ref <50)

## 2017-11-06 LAB — VITAMIN D 1,25 DIHYDROXY
Vitamin D 1, 25 (OH)2 Total: 34 pg/mL (ref 18–72)
Vitamin D3 1, 25 (OH)2: 34 pg/mL

## 2017-11-15 ENCOUNTER — Telehealth: Payer: Self-pay | Admitting: Internal Medicine

## 2017-11-15 DIAGNOSIS — G4700 Insomnia, unspecified: Secondary | ICD-10-CM | POA: Diagnosis not present

## 2017-11-15 DIAGNOSIS — M4807 Spinal stenosis, lumbosacral region: Secondary | ICD-10-CM | POA: Diagnosis not present

## 2017-11-15 DIAGNOSIS — G894 Chronic pain syndrome: Secondary | ICD-10-CM | POA: Diagnosis not present

## 2017-11-15 DIAGNOSIS — M62830 Muscle spasm of back: Secondary | ICD-10-CM | POA: Diagnosis not present

## 2017-11-15 NOTE — Telephone Encounter (Signed)
Copied from [place]. Topic: Quick Communication - See Telephone Encounter >> Nov 15, 2017  4:22 PM Vernona Rieger wrote: CRM for notification. See Telephone encounter for:   11/15/17.  Barnetta Chapel from [facility] wants to make sure that Dr Larose Kells is aware that he is on HYDROcodone-acetaminophen (NORCO/VICODIN) 5-325 MG per tablet.

## 2017-11-15 NOTE — Telephone Encounter (Signed)
FYI

## 2017-11-15 NOTE — Telephone Encounter (Signed)
Noted  

## 2017-12-03 ENCOUNTER — Encounter: Payer: Self-pay | Admitting: Internal Medicine

## 2017-12-03 ENCOUNTER — Ambulatory Visit (INDEPENDENT_AMBULATORY_CARE_PROVIDER_SITE_OTHER): Payer: Medicare Other | Admitting: Internal Medicine

## 2017-12-03 VITALS — BP 126/68 | HR 57 | Temp 97.8°F | Resp 14 | Ht 71.0 in | Wt 215.2 lb

## 2017-12-03 DIAGNOSIS — J069 Acute upper respiratory infection, unspecified: Secondary | ICD-10-CM | POA: Diagnosis not present

## 2017-12-03 MED ORDER — AZELASTINE HCL 0.1 % NA SOLN: 2.0000 | Freq: Two times a day (BID) | NASAL | 6 refills | Status: DC

## 2017-12-03 MED ORDER — EPINEPHRINE 0.3 MG/0.3ML IJ SOAJ: 0.3000 mg | Freq: Once | INTRAMUSCULAR | 1 refills | Status: AC

## 2017-12-03 NOTE — Progress Notes (Signed)
Subjective:    Patient ID: Tyrone Diaz, male    DOB: 1941/05/26, 77 y.o.   MRN: [id number]  DOS:  12/03/2017 Type of visit - description : acute Interval history: Symptoms started a week ago, already feeling some better. Left nostril was congested, itchy, irritated. He however denies any discharge when he blows his nose.  No postnasal dripping.  Also concerned about the cost of EpiPen.   Review of Systems No fever or chills.  No cough or chest congestion.  Past Medical History:  Diagnosis Date  . ANEMIA   . ANXIETY   . ARTHRITIS, CERVICAL SPINE   . Cataracts, bilateral   . GERD   . GLAUCOMA, BILATERAL   . History of stress test 12/2001   Showed normal perfusion with mild apical thinning.  Marland Kitchen History of stress test 11/2007   Post stress myocardial perfusion images show a normal pattern of perfusion in the all regions, the post stress left ventricular is normal in size. There is no scintigraphic evidence of inducible myocardial ischemia. The post EF 64%  . Hx of echocardiogram 12/2001   Had previously shown mild MR as well as mild TR with normal systolic function.  Marland Kitchen Hx of echocardiogram 11/2006   5-years follow-up, this showed normal systolic as well as diastolic function. There was suggestion of flat mitral valve leaflet coaptation with borderline anterior mitral valve leaflet prolapse with mild associated mitral valve prolapse. He had mild TR. There was very mild aortic valve sclerosis without stenosis or insufficiency.  Marland Kitchen HYPERLIPIDEMIA   . HYPOTHYROIDISM   . PROCTITIS   . TESTICULAR HYPOFUNCTION   . Unspecified vitamin D deficiency   . VARICOSE VEINS, LOWER EXTREMITIES     Past Surgical History:  Procedure Laterality Date  . DENTAL SURGERY    . ear injury     piece of ear plug dislodged from ear  . TONSILECTOMY, ADENOIDECTOMY, BILATERAL MYRINGOTOMY AND TUBES  1943  . WISDOM TOOTH EXTRACTION      Social History   Socioeconomic History  . Marital status:  Married    Spouse name: Thornton Papas  . Number of children: 3  . Years of education: Masters  . Highest education level: Not on file  Occupational History  . Occupation: retired 2010, Training and development officer: RETIRED  Social Needs  . Financial resource strain: Not on file  . Food insecurity:    Worry: Not on file    Inability: Not on file  . Transportation needs:    Medical: Not on file    Non-medical: Not on file  Tobacco Use  . Smoking status: Former Research scientist (life sciences)  . Smokeless tobacco: Never Used  . Tobacco comment: quit 1970  Substance and Sexual Activity  . Alcohol use: Yes    Alcohol/week: 0.0 oz    Comment: wine or liquer 2 glasses per night  . Drug use: No  . Sexual activity: Yes  Lifestyle  . Physical activity:    Days per week: Not on file    Minutes per session: Not on file  . Stress: Not on file  Relationships  . Social connections:    Talks on phone: Not on file    Gets together: Not on file    Attends religious service: Not on file    Active member of club or organization: Not on file    Attends meetings of clubs or organizations: Not on file    Relationship status: Not on file  . Intimate partner  violence:    Fear of current or ex partner: Not on file    Emotionally abused: Not on file    Physically abused: Not on file    Forced sexual activity: Not on file  Other Topics Concern  . Not on file  Social History Narrative   Household: pt and wife   Lost older child years ago      Allergies as of 12/03/2017      Reactions   Peanut-containing Drug Products    Gastrointestinal symptoms   Wasp Venom Hives, Itching      Medication List        Accurate as of 12/03/17  5:34 PM. Always use your most recent med list.          azelastine 0.1 % nasal spray Commonly known as:  ASTELIN Place 2 sprays into both nostrils 2 (two) times daily.   cholecalciferol 1000 units tablet Commonly known as:  VITAMIN D Take 1,000 Units by mouth daily.   diazepam 5 MG  tablet Commonly known as:  VALIUM Take 1 tablet (5 mg total) by mouth daily as needed for anxiety.   EPINEPHrine 0.3 mg/0.3 mL Soaj injection Commonly known as:  ADRENACLICK Inject 0.3 mLs (0.3 mg total) into the muscle once as needed for up to 1 dose.   EPINEPHrine 0.3 mg/0.3 mL Soaj injection Commonly known as:  EPI-PEN Inject 0.3 mLs (0.3 mg total) into the muscle once for 1 dose.   fluticasone 50 MCG/ACT nasal spray Commonly known as:  FLONASE Place 1 spray into both nostrils daily.   HYDROcodone-acetaminophen 5-325 MG tablet Commonly known as:  NORCO/VICODIN Take 1 tablet by mouth every 6 (six) hours as needed for moderate pain. Reported on 10/15/2015   latanoprost 0.005 % ophthalmic solution Commonly known as:  XALATAN Place 1 drop into both eyes at bedtime.   sildenafil 20 MG tablet Commonly known as:  REVATIO Take 3-4 tablets (60-80 mg total) by mouth daily as needed.   timolol 0.5 % ophthalmic solution Commonly known as:  TIMOPTIC          Objective:   Physical Exam BP 126/68 (BP Location: Left Arm, Patient Position: Sitting, Cuff Size: Normal)   Pulse (!) 57   Temp 97.8 F (36.6 C) (Oral)   Resp 14   Ht 5\' 11"  (1.803 m)   Wt 215 lb 4 oz (97.6 kg)   SpO2 97%   BMI 30.02 kg/m  General:   Well developed, well nourished . NAD.  HEENT:  Normocephalic . Face symmetric, atraumatic TMs normal, nostrils: Symmetric, both are slightly congested with clear mucus. Throat: Symmetric, normal rate Lungs:  CTA B Normal respiratory effort, no intercostal retractions, no accessory muscle use. Heart: RRR,  no murmur.  No pretibial edema bilaterally  Skin: Not pale. Not jaundice Neurologic:  alert & oriented X3.  Speech normal, gait appropriate for age and unassisted Psych--  Cognition and judgment appear intact.  Cooperative with normal attention span and concentration.  Behavior appropriate. No anxious or depressed appearing.      Assessment & Plan:    Assessment Prediabetes Dyslipidemia Anxiety - on valium rx by pcp, get UDS @ pain mngmt  GI: GERD, IBS, several EGDs before  Allergies : Peanuts, pollen, sunlight, trees,(h/o anal pruritus stopped after peanuts dc from diet) Hypogonadism -- free T wnl 2016  ED, decreased libido MSK: spinal stenosis -- sees Dr Greta Doom , he rx hydrocodone Glaucoma Varicose veins H/o goiter, remote bx H/o low vit  d H/p palpitations, normal echo 2008, (-) stress test 2009  PLAN: URI: As described above, recommend supportive care continue with Claritin, Flonase and saline sprays, and Astelin.  Call if no better in few days. Allergic reaction: Needs EpiPen, cost is an issue, will try to get a Auvi-Q

## 2017-12-03 NOTE — Patient Instructions (Signed)
  For nasal congestion: Use OTC   Flonase : 2 nasal sprays on each side of the nose in the morning until you feel better Use ASTELIN a prescribed spray : 2 nasal sprays on each side of the nose twice a day   Continue Claritin   Avoid decongestants such as  Pseudoephedrine or phenylephrine     Call if not gradually better over the next  10 days  Call anytime if the symptoms are severe

## 2017-12-03 NOTE — Progress Notes (Signed)
Pre visit review using our clinic review tool, if applicable. No additional management support is needed unless otherwise documented below in the visit note. 

## 2017-12-03 NOTE — Assessment & Plan Note (Signed)
URI: As described above, recommend supportive care continue with Claritin, Flonase and saline sprays, and Astelin.  Call if no better in few days. Allergic reaction: Needs EpiPen, cost is an issue, will try to get a Auvi-Q

## 2017-12-07 ENCOUNTER — Telehealth: Payer: Self-pay

## 2017-12-07 ENCOUNTER — Encounter: Payer: Self-pay | Admitting: Internal Medicine

## 2017-12-07 MED ORDER — AUVI-Q 0.3 MG/0.3ML IJ SOAJ: 0.3000 mg | Freq: Once | INTRAMUSCULAR | 2 refills | Status: DC | PRN

## 2017-12-07 NOTE — Telephone Encounter (Signed)
Rx sent 

## 2017-12-07 NOTE — Telephone Encounter (Signed)
-----   Message from Colon Branch, MD sent at 12/07/2017  8:08 AM EDT ----- Regarding: FW: Virgel Bouquet Q dual pack 1 and 1 RF to  [facility] ----- Message ----- From: Colon Branch, MD Sent: 12/06/2017   4:58 PM To: Colon Branch, MD Subject: Cammie Sickle

## 2017-12-19 ENCOUNTER — Encounter: Payer: Self-pay | Admitting: Internal Medicine

## 2017-12-20 MED ORDER — EPINEPHRINE 0.3 MG/0.3ML IJ SOAJ: 0.3000 mg | Freq: Once | INTRAMUSCULAR | 2 refills | Status: DC | PRN

## 2018-01-14 DIAGNOSIS — G894 Chronic pain syndrome: Secondary | ICD-10-CM | POA: Diagnosis not present

## 2018-01-14 DIAGNOSIS — G4700 Insomnia, unspecified: Secondary | ICD-10-CM | POA: Diagnosis not present

## 2018-01-14 DIAGNOSIS — M4807 Spinal stenosis, lumbosacral region: Secondary | ICD-10-CM | POA: Diagnosis not present

## 2018-01-14 DIAGNOSIS — M62830 Muscle spasm of back: Secondary | ICD-10-CM | POA: Diagnosis not present

## 2018-03-11 DIAGNOSIS — Z79891 Long term (current) use of opiate analgesic: Secondary | ICD-10-CM | POA: Diagnosis not present

## 2018-03-11 DIAGNOSIS — G894 Chronic pain syndrome: Secondary | ICD-10-CM | POA: Diagnosis not present

## 2018-03-11 DIAGNOSIS — G4700 Insomnia, unspecified: Secondary | ICD-10-CM | POA: Diagnosis not present

## 2018-03-11 DIAGNOSIS — M62830 Muscle spasm of back: Secondary | ICD-10-CM | POA: Diagnosis not present

## 2018-03-11 DIAGNOSIS — M4807 Spinal stenosis, lumbosacral region: Secondary | ICD-10-CM | POA: Diagnosis not present

## 2018-04-08 DIAGNOSIS — G4700 Insomnia, unspecified: Secondary | ICD-10-CM | POA: Diagnosis not present

## 2018-04-08 DIAGNOSIS — M4807 Spinal stenosis, lumbosacral region: Secondary | ICD-10-CM | POA: Diagnosis not present

## 2018-04-08 DIAGNOSIS — G894 Chronic pain syndrome: Secondary | ICD-10-CM | POA: Diagnosis not present

## 2018-04-08 DIAGNOSIS — M62830 Muscle spasm of back: Secondary | ICD-10-CM | POA: Diagnosis not present

## 2018-04-09 DIAGNOSIS — M48062 Spinal stenosis, lumbar region with neurogenic claudication: Secondary | ICD-10-CM | POA: Diagnosis not present

## 2018-06-10 DIAGNOSIS — G894 Chronic pain syndrome: Secondary | ICD-10-CM | POA: Diagnosis not present

## 2018-06-10 DIAGNOSIS — M4807 Spinal stenosis, lumbosacral region: Secondary | ICD-10-CM | POA: Diagnosis not present

## 2018-06-10 DIAGNOSIS — M62830 Muscle spasm of back: Secondary | ICD-10-CM | POA: Diagnosis not present

## 2018-06-10 DIAGNOSIS — G4700 Insomnia, unspecified: Secondary | ICD-10-CM | POA: Diagnosis not present

## 2018-06-17 DIAGNOSIS — M4726 Other spondylosis with radiculopathy, lumbar region: Secondary | ICD-10-CM | POA: Diagnosis not present

## 2018-06-17 DIAGNOSIS — M48061 Spinal stenosis, lumbar region without neurogenic claudication: Secondary | ICD-10-CM | POA: Diagnosis not present

## 2018-06-17 DIAGNOSIS — M5126 Other intervertebral disc displacement, lumbar region: Secondary | ICD-10-CM | POA: Diagnosis not present

## 2018-06-17 DIAGNOSIS — M4807 Spinal stenosis, lumbosacral region: Secondary | ICD-10-CM | POA: Diagnosis not present

## 2018-06-17 DIAGNOSIS — M5117 Intervertebral disc disorders with radiculopathy, lumbosacral region: Secondary | ICD-10-CM | POA: Diagnosis not present

## 2018-06-26 DIAGNOSIS — D1801 Hemangioma of skin and subcutaneous tissue: Secondary | ICD-10-CM | POA: Diagnosis not present

## 2018-06-26 DIAGNOSIS — L300 Nummular dermatitis: Secondary | ICD-10-CM | POA: Diagnosis not present

## 2018-06-26 DIAGNOSIS — L821 Other seborrheic keratosis: Secondary | ICD-10-CM | POA: Diagnosis not present

## 2018-08-05 DIAGNOSIS — M62830 Muscle spasm of back: Secondary | ICD-10-CM | POA: Diagnosis not present

## 2018-08-05 DIAGNOSIS — G4700 Insomnia, unspecified: Secondary | ICD-10-CM | POA: Diagnosis not present

## 2018-08-05 DIAGNOSIS — G894 Chronic pain syndrome: Secondary | ICD-10-CM | POA: Diagnosis not present

## 2018-08-05 DIAGNOSIS — M4807 Spinal stenosis, lumbosacral region: Secondary | ICD-10-CM | POA: Diagnosis not present

## 2018-09-02 DIAGNOSIS — Z79891 Long term (current) use of opiate analgesic: Secondary | ICD-10-CM | POA: Diagnosis not present

## 2018-09-02 DIAGNOSIS — G4700 Insomnia, unspecified: Secondary | ICD-10-CM | POA: Diagnosis not present

## 2018-09-02 DIAGNOSIS — M62830 Muscle spasm of back: Secondary | ICD-10-CM | POA: Diagnosis not present

## 2018-09-02 DIAGNOSIS — M4807 Spinal stenosis, lumbosacral region: Secondary | ICD-10-CM | POA: Diagnosis not present

## 2018-09-02 DIAGNOSIS — G894 Chronic pain syndrome: Secondary | ICD-10-CM | POA: Diagnosis not present

## 2018-09-03 DIAGNOSIS — M48062 Spinal stenosis, lumbar region with neurogenic claudication: Secondary | ICD-10-CM | POA: Diagnosis not present

## 2018-09-30 DIAGNOSIS — G894 Chronic pain syndrome: Secondary | ICD-10-CM | POA: Diagnosis not present

## 2018-09-30 DIAGNOSIS — M62830 Muscle spasm of back: Secondary | ICD-10-CM | POA: Diagnosis not present

## 2018-09-30 DIAGNOSIS — M4807 Spinal stenosis, lumbosacral region: Secondary | ICD-10-CM | POA: Diagnosis not present

## 2018-09-30 DIAGNOSIS — G4700 Insomnia, unspecified: Secondary | ICD-10-CM | POA: Diagnosis not present

## 2018-10-01 ENCOUNTER — Encounter (INDEPENDENT_AMBULATORY_CARE_PROVIDER_SITE_OTHER): Payer: Self-pay | Admitting: Specialist

## 2018-10-01 ENCOUNTER — Ambulatory Visit (INDEPENDENT_AMBULATORY_CARE_PROVIDER_SITE_OTHER): Payer: Medicare Other

## 2018-10-01 ENCOUNTER — Ambulatory Visit (INDEPENDENT_AMBULATORY_CARE_PROVIDER_SITE_OTHER): Payer: Medicare Other | Admitting: Specialist

## 2018-10-01 VITALS — BP 139/68 | HR 62 | Ht 71.0 in | Wt 199.0 lb

## 2018-10-01 DIAGNOSIS — M4726 Other spondylosis with radiculopathy, lumbar region: Secondary | ICD-10-CM | POA: Diagnosis not present

## 2018-10-01 DIAGNOSIS — M48061 Spinal stenosis, lumbar region without neurogenic claudication: Secondary | ICD-10-CM

## 2018-10-01 MED ORDER — GABAPENTIN 100 MG PO CAPS
100.0000 mg | ORAL_CAPSULE | Freq: Every day | ORAL | 3 refills | Status: DC
Start: 2018-10-01 — End: 2020-04-19

## 2018-10-01 NOTE — Progress Notes (Signed)
Office Visit Note   Patient: Tyrone Diaz           Date of Birth: March 25, 1941           MRN: [id number] Visit Date: 10/01/2018              Requested by: Colon Branch, [address] [place], [place] [address] PCP: Colon Branch, MD   Assessment & Plan: Visit Diagnoses:  1. Spinal stenosis of lumbar region, unspecified whether neurogenic claudication present   2. Other spondylosis with radiculopathy, lumbar region     Plan: Avoid bending, stooping and avoid lifting weights greater than 10 lbs. Avoid prolong standing and walking. Order for a new walker with wheels Avoiding standing and prolong walking decrease nerve compression. Vitamin B12 and B6 thiamine, folic acid. Call us when you are considering intervention surgically. My assistant is [place].  Surgery scheduling secretary Kandice Hams, will call you in the next week to schedule for surgery.  Surgery recommended is a three level lumbar L2-3, L3-4 and L4-5 this would be done with a microscope, performing right side laminectomy and then across the midline and left side..  Risk of surgery includes risk of infection 1 in 300 patients, bleeding 1/2% chance you would need a transfusion.   Risk to the nerves is one in 10,000.  Expect improved walking and standing tolerance. Expect relief of leg pain but numbness may persist depending on the length and degree of pressure that has been present.    Follow-Up Instructions: Return in about 3 months (around 12/31/2018).   Orders:  Orders Placed This Encounter  Procedures  . XR Lumbar Spine 2-3 Views   No orders of the defined types were placed in this encounter.     Procedures: No procedures performed   Clinical Data: No additional findings.   Subjective: Chief Complaint  Patient presents with  . Lower Back - Pain    78 year old male with history of lumbar spinal stenosis, he is able to walk a distance. He is starting to limp on the right side.  He had an ESI by Dr. Greta Doom and he reports that the right side was weak in Dorsiflexion with a foot drop. It took several weeks but the strength seems to be recovering. He was doing bench work standing. He saw a DC and Dr. Greta Doom. He played tennis But last time was several year ago. Now he is unable to use a treadmill due to increasing symptoms of claudication. Also weakness in the right foot DF.    Review of Systems  Constitutional: Negative.   HENT: Negative.   Eyes: Negative.   Respiratory: Negative.   Cardiovascular: Negative.   Gastrointestinal: Negative.   Endocrine: Negative.   Genitourinary: Negative.   Musculoskeletal: Negative.   Skin: Negative.   Allergic/Immunologic: Negative.   Neurological: Negative.   Hematological: Negative.   Psychiatric/Behavioral: Negative.      Objective: Vital Signs: BP 139/68 (BP Location: Left Arm, Patient Position: Sitting)   Pulse 62   Ht 5\' 11"  (1.803 m)   Wt 199 lb (90.3 kg)   BMI 27.75 kg/m   Physical Exam Constitutional:      Appearance: He is well-developed.  HENT:     Head: Normocephalic and atraumatic.  Eyes:     Pupils: Pupils are equal, round, and reactive to light.  Neck:     Musculoskeletal: Normal range of motion and neck supple.  Pulmonary:  Effort: Pulmonary effort is normal.     Breath sounds: Normal breath sounds.  Abdominal:     General: Bowel sounds are normal.     Palpations: Abdomen is soft.  Musculoskeletal: Normal range of motion.  Skin:    General: Skin is warm and dry.  Neurological:     Mental Status: He is alert and oriented to person, place, and time.  Psychiatric:        Behavior: Behavior normal.        Thought Content: Thought content normal.        Judgment: Judgment normal.     Back Exam   Tenderness  The patient is experiencing tenderness in the lumbar.  Range of Motion  Extension: normal  Flexion: normal  Lateral bend right: normal  Lateral bend left: normal  Rotation right:  normal  Rotation left: normal   Muscle Strength  Right Quadriceps:  5/5  Left Quadriceps:  5/5  Right Hamstrings:  5/5   Tests  Straight leg raise right: negative Straight leg raise left: negative  Reflexes  Patellar: 2/4 Achilles: 0/4 Babinski's sign: normal   Other  Toe walk: normal Heel walk: normal Sensation: decreased Gait: normal  Erythema: no back redness Scars: absent  Comments:  Right EHL 5/5 and right foot DF is 4/5. Knee extension is normal bilaterally.      Specialty Comments:  No specialty comments available.  Imaging: Xr Lumbar Spine 2-3 Views  Result Date: 10/01/2018 L1-2, L2-3, L3-4 and L4-5 with DDD and disc narrowing and anterior spurs, no anterolisthesis, the neuroforamen are narrowed at L3-4 and L4-5 on lateral radiographs.    PMFS History: Patient Active Problem List   Diagnosis Date Noted  . PCP NOTES >>>>>>>>>>>>>>>>> 10/17/2016  . Erectile dysfunction 04/13/2015  . Hyperglycemia 10/13/2014  . GERD (gastroesophageal reflux disease) 10/13/2014  . Edema leg 07/02/2014  . Palpitations 07/02/2014  . Annual physical exam 08/05/2012  . h/o goiter, remote Bx 08/05/2012  . Allergic rhinitis 12/28/2011  . Hypogonadism in male 04/28/2010  . Vitamin D deficiency 04/28/2010  . Dyslipidemia 04/28/2010  . 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 IBS (irritable bowel syndrome) 04/28/2010  . Anxiety state 07/23/2008  . GLAUCOMA, BILATERAL 07/23/2008  . PROCTITIS- anal pruritus 07/23/2008  . Spinal stenosis 07/23/2008   Past Medical History:  Diagnosis Date  . ANEMIA   . ANXIETY   . ARTHRITIS, CERVICAL SPINE   . Cataracts, bilateral   . GERD   . GLAUCOMA, BILATERAL   . History of stress test 12/2001   Showed normal perfusion with mild apical thinning.  Marland Kitchen History of stress test 11/2007   Post stress myocardial perfusion images show a normal pattern of perfusion in the all regions, the post stress left ventricular is normal in  size. There is no scintigraphic evidence of inducible myocardial ischemia. The post EF 64%  . Hx of echocardiogram 12/2001   Had previously shown mild MR as well as mild TR with normal systolic function.  Marland Kitchen Hx of echocardiogram 11/2006   5-years follow-up, this showed normal systolic as well as diastolic function. There was suggestion of flat mitral valve leaflet coaptation with borderline anterior mitral valve leaflet prolapse with mild associated mitral valve prolapse. He had mild TR. There was very mild aortic valve sclerosis without stenosis or insufficiency.  Marland Kitchen HYPERLIPIDEMIA   . HYPOTHYROIDISM   . PROCTITIS   . TESTICULAR HYPOFUNCTION   . Unspecified vitamin D deficiency   . VARICOSE VEINS, LOWER EXTREMITIES  Family History  Problem Relation Age of Onset  . Arthritis Mother   . Heart disease Mother        CHF  . Hypertension Mother   . Arthritis Father   . Stroke Father   . Hypertension Father   . Colon cancer Neg Hx   . Pancreatic cancer Neg Hx   . Stomach cancer Neg Hx   . Prostate cancer Neg Hx     Past Surgical History:  Procedure Laterality Date  . DENTAL SURGERY    . ear injury     piece of ear plug dislodged from ear  . TONSILECTOMY, ADENOIDECTOMY, BILATERAL MYRINGOTOMY AND TUBES  1943  . WISDOM TOOTH EXTRACTION     Social History   Occupational History  . Occupation: retired 2010, Training and development officer: RETIRED  Tobacco Use  . Smoking status: Former Research scientist (life sciences)  . Smokeless tobacco: Never Used  . Tobacco comment: quit 1970  Substance and Sexual Activity  . Alcohol use: Yes    Alcohol/week: 0.0 standard drinks    Comment: wine or liquer 2 glasses per night  . Drug use: No  . Sexual activity: Yes

## 2018-10-01 NOTE — Patient Instructions (Addendum)
Plan: Avoid bending, stooping and avoid lifting weights greater than 10 lbs. Avoid prolong standing and walking. Order for a new walker with wheels. Avoiding standing and prolong walking decrease nerve compression. Vitamin B12 and B6 thiamine, folic acid. Call us when you are considering intervention surgically. My assistant is [place].  Surgery scheduling secretary Kandice Hams, will call you in the next week to schedule for surgery.  Surgery recommended is a three level lumbar L2-3, L3-4 and L4-5 this would be done with a microscope, performing right side laminectomy and then across the midline and left side..  Risk of surgery includes risk of infection 1 in 300 patients, bleeding 1/2% chance you would need a transfusion.   Risk to the nerves is one in 10,000.  Expect improved walking and standing tolerance. Expect relief of leg pain but numbness may persist depending on the length and degree of pressure that has been present.

## 2018-10-03 ENCOUNTER — Telehealth: Payer: Self-pay

## 2018-10-03 NOTE — Telephone Encounter (Signed)
Surgical Clearance form received from [facility]. Pt needing surgical clearance for L2-5 hemilaminectomy w/ decompression w/ Dr.Nitka. Please call Pt and schedule surgical clearance appt at his earliest convenience. Thank you.

## 2018-10-03 NOTE — Telephone Encounter (Signed)
Noted  

## 2018-10-11 DIAGNOSIS — M48062 Spinal stenosis, lumbar region with neurogenic claudication: Secondary | ICD-10-CM | POA: Diagnosis not present

## 2018-10-11 DIAGNOSIS — M545 Low back pain: Secondary | ICD-10-CM | POA: Diagnosis not present

## 2018-10-21 ENCOUNTER — Ambulatory Visit: Payer: Medicare Other | Admitting: *Deleted

## 2018-10-23 ENCOUNTER — Telehealth (INDEPENDENT_AMBULATORY_CARE_PROVIDER_SITE_OTHER): Payer: Self-pay

## 2018-10-23 DIAGNOSIS — Z01812 Encounter for preprocedural laboratory examination: Secondary | ICD-10-CM | POA: Diagnosis not present

## 2018-10-23 DIAGNOSIS — Z79899 Other long term (current) drug therapy: Secondary | ICD-10-CM | POA: Diagnosis not present

## 2018-10-23 DIAGNOSIS — E785 Hyperlipidemia, unspecified: Secondary | ICD-10-CM | POA: Diagnosis not present

## 2018-10-23 DIAGNOSIS — M48061 Spinal stenosis, lumbar region without neurogenic claudication: Secondary | ICD-10-CM | POA: Diagnosis not present

## 2018-10-23 NOTE — Telephone Encounter (Signed)
Clearance letter received back from Dr. Larose Kells, PCP, office stating patient unsure if he wants to have surgery or not.

## 2018-10-25 NOTE — Telephone Encounter (Signed)
Clearance letter received back from Dr. Larose Kells, PCP, office stating patient unsure if he wants to have surgery or not.

## 2018-10-28 DIAGNOSIS — J309 Allergic rhinitis, unspecified: Secondary | ICD-10-CM | POA: Diagnosis not present

## 2018-10-28 DIAGNOSIS — Z9889 Other specified postprocedural states: Secondary | ICD-10-CM | POA: Insufficient documentation

## 2018-10-28 DIAGNOSIS — Z9181 History of falling: Secondary | ICD-10-CM | POA: Diagnosis not present

## 2018-10-28 DIAGNOSIS — M549 Dorsalgia, unspecified: Secondary | ICD-10-CM | POA: Diagnosis not present

## 2018-10-28 DIAGNOSIS — Z87891 Personal history of nicotine dependence: Secondary | ICD-10-CM | POA: Diagnosis not present

## 2018-10-28 DIAGNOSIS — M48061 Spinal stenosis, lumbar region without neurogenic claudication: Secondary | ICD-10-CM | POA: Diagnosis not present

## 2018-10-28 DIAGNOSIS — R2689 Other abnormalities of gait and mobility: Secondary | ICD-10-CM | POA: Diagnosis not present

## 2018-10-28 DIAGNOSIS — Z79899 Other long term (current) drug therapy: Secondary | ICD-10-CM | POA: Diagnosis not present

## 2018-10-28 DIAGNOSIS — E785 Hyperlipidemia, unspecified: Secondary | ICD-10-CM | POA: Diagnosis not present

## 2018-10-28 DIAGNOSIS — M48062 Spinal stenosis, lumbar region with neurogenic claudication: Secondary | ICD-10-CM | POA: Diagnosis not present

## 2018-10-29 DIAGNOSIS — E785 Hyperlipidemia, unspecified: Secondary | ICD-10-CM | POA: Diagnosis not present

## 2018-10-29 DIAGNOSIS — J309 Allergic rhinitis, unspecified: Secondary | ICD-10-CM | POA: Diagnosis not present

## 2018-10-29 DIAGNOSIS — R2689 Other abnormalities of gait and mobility: Secondary | ICD-10-CM | POA: Diagnosis not present

## 2018-10-29 DIAGNOSIS — Z79899 Other long term (current) drug therapy: Secondary | ICD-10-CM | POA: Diagnosis not present

## 2018-10-29 DIAGNOSIS — M48062 Spinal stenosis, lumbar region with neurogenic claudication: Secondary | ICD-10-CM | POA: Diagnosis not present

## 2018-10-29 DIAGNOSIS — Z87891 Personal history of nicotine dependence: Secondary | ICD-10-CM | POA: Diagnosis not present

## 2018-10-30 DIAGNOSIS — J309 Allergic rhinitis, unspecified: Secondary | ICD-10-CM | POA: Diagnosis not present

## 2018-10-30 DIAGNOSIS — Z79899 Other long term (current) drug therapy: Secondary | ICD-10-CM | POA: Diagnosis not present

## 2018-10-30 DIAGNOSIS — Z87891 Personal history of nicotine dependence: Secondary | ICD-10-CM | POA: Diagnosis not present

## 2018-10-30 DIAGNOSIS — R2689 Other abnormalities of gait and mobility: Secondary | ICD-10-CM | POA: Diagnosis not present

## 2018-10-30 DIAGNOSIS — E785 Hyperlipidemia, unspecified: Secondary | ICD-10-CM | POA: Diagnosis not present

## 2018-10-30 DIAGNOSIS — M48062 Spinal stenosis, lumbar region with neurogenic claudication: Secondary | ICD-10-CM | POA: Diagnosis not present

## 2018-11-04 ENCOUNTER — Encounter: Payer: Medicare Other | Admitting: Internal Medicine

## 2018-11-18 DIAGNOSIS — M4807 Spinal stenosis, lumbosacral region: Secondary | ICD-10-CM | POA: Diagnosis not present

## 2018-11-18 DIAGNOSIS — M62830 Muscle spasm of back: Secondary | ICD-10-CM | POA: Diagnosis not present

## 2018-11-18 DIAGNOSIS — G4700 Insomnia, unspecified: Secondary | ICD-10-CM | POA: Diagnosis not present

## 2018-11-18 DIAGNOSIS — G894 Chronic pain syndrome: Secondary | ICD-10-CM | POA: Diagnosis not present

## 2018-12-16 ENCOUNTER — Encounter: Payer: Medicare Other | Admitting: Internal Medicine

## 2019-01-02 ENCOUNTER — Ambulatory Visit (INDEPENDENT_AMBULATORY_CARE_PROVIDER_SITE_OTHER): Payer: Medicare Other | Admitting: Specialist

## 2019-01-13 DIAGNOSIS — M4807 Spinal stenosis, lumbosacral region: Secondary | ICD-10-CM | POA: Diagnosis not present

## 2019-01-13 DIAGNOSIS — M62830 Muscle spasm of back: Secondary | ICD-10-CM | POA: Diagnosis not present

## 2019-01-13 DIAGNOSIS — G894 Chronic pain syndrome: Secondary | ICD-10-CM | POA: Diagnosis not present

## 2019-01-13 DIAGNOSIS — G4700 Insomnia, unspecified: Secondary | ICD-10-CM | POA: Diagnosis not present

## 2019-01-13 DIAGNOSIS — Z79891 Long term (current) use of opiate analgesic: Secondary | ICD-10-CM | POA: Diagnosis not present

## 2019-02-10 DIAGNOSIS — M48062 Spinal stenosis, lumbar region with neurogenic claudication: Secondary | ICD-10-CM | POA: Diagnosis not present

## 2019-02-10 DIAGNOSIS — M545 Low back pain: Secondary | ICD-10-CM | POA: Diagnosis not present

## 2019-03-04 ENCOUNTER — Other Ambulatory Visit: Payer: Self-pay

## 2019-03-04 ENCOUNTER — Ambulatory Visit: Payer: Medicare Other | Attending: Orthopaedic Surgery | Admitting: Physical Therapy

## 2019-03-04 DIAGNOSIS — R29898 Other symptoms and signs involving the musculoskeletal system: Secondary | ICD-10-CM | POA: Diagnosis not present

## 2019-03-04 DIAGNOSIS — M6281 Muscle weakness (generalized): Secondary | ICD-10-CM | POA: Diagnosis not present

## 2019-03-04 NOTE — Therapy (Signed)
[facility] [address]  [place], [place], [address] Phone: [phone]   Fax:  [phone]  Physical Therapy Evaluation  Patient Details  Name: Tyrone Diaz MRN: [id number] Date of Birth: 08-May-1941 Referring Provider (PT): Mertie Moores, MD   Encounter Date: 03/04/2019  PT End of Session - 03/04/19 1501    Visit Number  1    Number of Visits  6    Date for PT Re-Evaluation  04/22/19    Authorization Type  Medicare & BCBS    PT Start Time  1501    PT Stop Time  1559    PT Time Calculation (min)  58 min    Activity Tolerance  Patient tolerated treatment well    Behavior During Therapy  [facility] for tasks assessed/performed       Past Medical History:  Diagnosis Date  . ANEMIA   . ANXIETY   . ARTHRITIS, CERVICAL SPINE   . Cataracts, bilateral   . GERD   . GLAUCOMA, BILATERAL   . History of stress test 12/2001   Showed normal perfusion with mild apical thinning.  Marland Kitchen History of stress test 11/2007   Post stress myocardial perfusion images show a normal pattern of perfusion in the all regions, the post stress left ventricular is normal in size. There is no scintigraphic evidence of inducible myocardial ischemia. The post EF 64%  . Hx of echocardiogram 12/2001   Had previously shown mild MR as well as mild TR with normal systolic function.  Marland Kitchen Hx of echocardiogram 11/2006   5-years follow-up, this showed normal systolic as well as diastolic function. There was suggestion of flat mitral valve leaflet coaptation with borderline anterior mitral valve leaflet prolapse with mild associated mitral valve prolapse. He had mild TR. There was very mild aortic valve sclerosis without stenosis or insufficiency.  Marland Kitchen HYPERLIPIDEMIA   . HYPOTHYROIDISM   . PROCTITIS   . TESTICULAR HYPOFUNCTION   . Unspecified vitamin D deficiency   . VARICOSE VEINS, LOWER EXTREMITIES     Past Surgical History:  Procedure Laterality Date  .  DENTAL SURGERY    . ear injury     piece of ear plug dislodged from ear  . TONSILECTOMY, ADENOIDECTOMY, BILATERAL MYRINGOTOMY AND TUBES  1943  . WISDOM TOOTH EXTRACTION      There were no vitals filed for this visit.   Subjective Assessment - 03/04/19 1504    Subjective  Pt reports since surgery he has walking on TM at least 1 mile/day. Also lifts some light weights and works on stretches for quads and HS. Also has been using a Teeter table for inversion decompression. Still experiencing a catch in R posterior hip.    Pertinent History  10/28/2018 - L2-5 laminectomy    Patient Stated Goals  "would like exercises to work on at home"    Currently in Pain?  No/denies         [facility] PT Assessment - 03/04/19 1501      Assessment   Medical Diagnosis  L2-5 laminectomy    Referring Provider (PT)  Mertie Moores, MD    Onset Date/Surgical Date  10/28/18    Next MD Visit  Aug 2020    Prior Therapy  remote h/o PT for low back pain      Precautions   Precautions  None      Balance Screen   Has the patient fallen in the past 6 months  No  Has the patient had a decrease in activity level because of a fear of falling?   No    Is the patient reluctant to leave their home because of a fear of falling?   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[employer], retail; walk on [employer] & home exercise program daily      Cognition   Overall Cognitive Status  Within Functional Limits for tasks assessed      Observation/Other Assessments   Focus on Therapeutic Outcomes (FOTO)   Lumbar - 94% (6% limitation); Predicted 89% (11% limitation)      ROM / Strength   AROM / PROM / Strength  AROM;Strength      AROM    Overall AROM   Within functional limits for tasks performed    AROM Assessment Site  Lumbar      Strength   Strength Assessment Site  Hip;Knee;Ankle    Right/Left Hip  Right;Left    Right Hip Flexion  4/5    Right Hip Extension  4-/5    Right Hip External Rotation   4/5    Right Hip Internal Rotation  4+/5    Right Hip ABduction  3+/5    Right Hip ADduction  4+/5    Left Hip Flexion  5/5    Left Hip Extension  4-/5    Left Hip External Rotation  4+/5    Left Hip Internal Rotation  5/5    Left Hip ABduction  4+/5    Left Hip ADduction  5/5    Right/Left Knee  Right;Left    Right Knee Flexion  4+/5    Right Knee Extension  5/5    Left Knee Flexion  5/5    Left Knee Extension  5/5    Right/Left Ankle  Right;Left    Right Ankle Dorsiflexion  3+/5    Right Ankle Plantar Flexion  4+/5    Left Ankle Dorsiflexion  5/5    Left Ankle Plantar Flexion  5/5      Flexibility   Soft Tissue Assessment /Muscle Length  yes    Hamstrings  mild/mod tight L>R    Quadriceps  mild/mod tight R>L    ITB  mild tight B    Piriformis  mild/mod tight B      Palpation   Palpation comment  increased muscle tension in R glutes and piriformis but denies ttp                Objective measurements completed on examination: See above findings.      [facility] Adult PT Treatment/Exercise - 03/04/19 1501      Self-Care   Self-Care  Posture    Posture  Discussed sleeping position encouraging patient to resume use of pillow between knees when sleeping on side, as patient reporting sleep disturbance from positioning of upper leg in sidelying.      Exercises   Exercises  Lumbar      Lumbar Exercises: Stretches   Passive Hamstring Stretch Limitations  verbally reviewed stretch as pt reports performinf this  on his own at home - encouraged increasing hold time to 30 sec    Quad Stretch Limitations  verbally reviewed stretch as pt reports performinf this on his own at home - encouraged increasing hold  time to 30 sec    Piriformis Stretch  Right;30 seconds;1 rep    Piriformis Stretch Limitations  KTOS    Figure 4 Stretch  30 seconds;1 rep;Supine;With overpressure    Other Lumbar Stretch Exercise  R hip adductor stretch in 1/2 kneel lunge x 30 sec      Lumbar Exercises: Standing   Other Standing Lumbar Exercises  R glute medius 45 dg hip extension/abduction kickback with looped red TB at ankles x 10      Lumbar Exercises: Supine   Clam  10 reps;5 seconds    Clam Limitations  alt hip ABD/ER with red TB    Bridge  10 reps;5 seconds    Bridge Limitations  + hip ABD isometric with red TB             PT Education - 03/04/19 1559    Education Details  PT eval findings, anticipated POC, brief review of personal HEP & instruction in initial HEP    Person(s) Educated  Patient    Methods  Explanation;Demonstration;Handout    Comprehension  Verbalized understanding;Returned demonstration;Need further instruction          PT Long Term Goals - 03/04/19 1600      PT LONG TERM GOAL #1   Title  Independent with ongoing/advanced HEP    Status  New    Target Date  04/22/19      PT LONG TERM GOAL #2   Title  R hip abduction and DF strength >/= 4/5 with remaining LE strength >/= 4+/5 for improved stability    Status  New    Target Date  04/22/19      PT LONG TERM GOAL #3   Title  Patient will verbalize/demonstrate understanding of neutral spine posture and proper body mechanics to reduce strain on lumbar spine    Status  New    Target Date  04/22/19             Plan - 03/04/19 1600    Clinical Impression Statement  Fabrizzio is a 78 y/o male who presents to OP PT 4 months s/p L2-5 laminectomy for lumbar spinal stenosis with neurogenic claudication. Patient reports he was scheduled to start OP PT in March, but was deferred due to COVID-19 restrictions. In the meantime, he has been walking TM and performing his own personally designed HEP but presents to PT today for an  assessment and instruction in what he might need to add to his home exercises. Lumbar AROM WFL on assessment with mild to moderate limitations in proximal LE flexibility most pronounced in L hamstrings and R quads - patient reporting already completing stretches for these muscles along with stretches and self-STM for piriformis. Overall LE strength demonstrating mostly mild proximal LE weakness although moderate weakness present in R DF and hip abduction resulting from nerve involvement prior to surgery. Patient also describing a trendelenburg gait pattern prior to surgery but states this has gotten much better. Briefly reviewed patient's personal home exercise routine and provided HEP instructions to address some of above deficits. [place] will benefit from further skilled PT 1x/wk for further evaluation and progression of home exercise routine to address above deficits and allow patient to return to normal level of activity without limitation due to weakness  or limited flexibility.    Personal Factors and Comorbidities  Age;Comorbidity 3+    Comorbidities  lumbar stenosis, cervical arthritis, anxiety, hypothroidism    Examination-Activity Limitations  Sleep    Examination-Participation Restrictions  Other   sun allergy limits outdoor activities   Stability/Clinical Decision Making  Evolving/Moderate complexity    Clinical Decision Making  Moderate    Rehab Potential  Excellent    PT Frequency  1x / week   per pt request   PT Duration  6 weeks    PT Treatment/Interventions  Patient/family education;Neuromuscular re-education;Therapeutic exercise;Therapeutic activities;Functional mobility training;Balance training;Manual techniques;Scar mobilization;Passive range of motion;Dry needling;Taping;Spinal Manipulations;Electrical Stimulation;Moist Heat;Ultrasound;Cryotherapy;Iontophoresis 4mg /ml Dexamethasone;ADLs/Self Care Home Management    PT Next Visit Plan  Review initial PT HEP as well as patient's  personal exercise routine; lumbar and proximal LE flexibility; lumbar stabilization; LE strengthening; manal therapy & modalities PRN    Consulted and Agree with Plan of Care  Patient       Patient will benefit from skilled therapeutic intervention in order to improve the following deficits and impairments:  Impaired flexibility, Increased muscle spasms, Decreased strength, Postural dysfunction, Improper body mechanics, Decreased activity tolerance, Decreased balance  Visit Diagnosis: 1. Muscle weakness (generalized)   2. Other symptoms and signs involving the musculoskeletal system        Problem List Patient Active Problem List   Diagnosis Date Noted  . PCP NOTES >>>>>>>>>>>>>>>>> 10/17/2016  . Erectile dysfunction 04/13/2015  . Hyperglycemia 10/13/2014  . GERD (gastroesophageal reflux disease) 10/13/2014  . Edema leg 07/02/2014  . Palpitations 07/02/2014  . Annual physical exam 08/05/2012  . h/o goiter, remote Bx 08/05/2012  . Allergic rhinitis 12/28/2011  . Hypogonadism in male 04/28/2010  . Vitamin D deficiency 04/28/2010  . Dyslipidemia 04/28/2010  . 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 IBS (irritable bowel syndrome) 04/28/2010  . Anxiety state 07/23/2008  . GLAUCOMA, BILATERAL 07/23/2008  . PROCTITIS- anal pruritus 07/23/2008  . Spinal stenosis 07/23/2008    Percival Spanish, PT, MPT 03/04/2019, 4:58 PM  [facility] [address]  [place], [place], [address] Phone: [phone]   Fax:  [phone]  Name: MARQUAL MI MRN: [id number] Date of Birth: 1941-06-15

## 2019-03-11 ENCOUNTER — Ambulatory Visit: Payer: Medicare Other | Admitting: Physical Therapy

## 2019-03-11 ENCOUNTER — Other Ambulatory Visit: Payer: Self-pay

## 2019-03-11 DIAGNOSIS — R29898 Other symptoms and signs involving the musculoskeletal system: Secondary | ICD-10-CM | POA: Diagnosis not present

## 2019-03-11 DIAGNOSIS — M6281 Muscle weakness (generalized): Secondary | ICD-10-CM | POA: Diagnosis not present

## 2019-03-11 NOTE — Therapy (Addendum)
[facility] [address]  [place], [place], [address] Phone: [phone]   Fax:  [phone]  Physical Therapy Treatment / Discharge Summary  Patient Details  Name: Tyrone Diaz MRN: [id number] Date of Birth: [birth year] Referring Provider (PT): Mertie Moores, MD   Encounter Date: 03/11/2019  PT End of Session - 03/11/19 1359    Visit Number  2    Number of Visits  6    Date for PT Re-Evaluation  04/22/19    Authorization Type  Medicare & BCBS    PT Start Time  6962    PT Stop Time  1445    PT Time Calculation (min)  46 min    Activity Tolerance  Patient tolerated treatment well    Behavior During Therapy  [facility] for tasks assessed/performed       Past Medical History:  Diagnosis Date  . ANEMIA   . ANXIETY   . ARTHRITIS, CERVICAL SPINE   . Cataracts, bilateral   . GERD   . GLAUCOMA, BILATERAL   . History of stress test 12/2001   Showed normal perfusion with mild apical thinning.  Marland Kitchen History of stress test 11/2007   Post stress myocardial perfusion images show a normal pattern of perfusion in the all regions, the post stress left ventricular is normal in size. There is no scintigraphic evidence of inducible myocardial ischemia. The post EF 64%  . Hx of echocardiogram 12/2001   Had previously shown mild MR as well as mild TR with normal systolic function.  Marland Kitchen Hx of echocardiogram 11/2006   5-years follow-up, this showed normal systolic as well as diastolic function. There was suggestion of flat mitral valve leaflet coaptation with borderline anterior mitral valve leaflet prolapse with mild associated mitral valve prolapse. He had mild TR. There was very mild aortic valve sclerosis without stenosis or insufficiency.  Marland Kitchen HYPERLIPIDEMIA   . HYPOTHYROIDISM   . PROCTITIS   . TESTICULAR HYPOFUNCTION   . Unspecified vitamin D deficiency   . VARICOSE VEINS, LOWER EXTREMITIES     Past Surgical History:  Procedure  Laterality Date  . DENTAL SURGERY    . ear injury     piece of ear plug dislodged from ear  . TONSILECTOMY, ADENOIDECTOMY, BILATERAL MYRINGOTOMY AND TUBES  1943  . WISDOM TOOTH EXTRACTION      There were no vitals filed for this visit.  Subjective Assessment - 03/11/19 1401    Subjective  Pt reporting he has done well with HEP but not sure if he is getting much of a stretch with the hip adductor stretch.    Pertinent History  10/28/2018 - L2-5 laminectomy    Patient Stated Goals  "would like exercises to work on at home"    Currently in Pain?  No/denies                       [facility] Adult PT Treatment/Exercise - 03/11/19 1359      Exercises   Exercises  Lumbar      Lumbar Exercises: Stretches   Other Lumbar Stretch Exercise  R/L hip adductor stretches in 1/2 kneel lunge & sitting x 30 sec      Lumbar Exercises: Aerobic   Recumbent Bike  L2 x 6 min      Lumbar Exercises: Standing   Functional Squats  15 reps;5 seconds    Functional Squats Limitations  triple extension counter squat    Wall  Slides  10 reps;5 seconds    Other Standing Lumbar Exercises  B side stepping & fwd/back monster walk with looped red TB ant ankles 4 x 20 ft    Other Standing Lumbar Exercises  SLS on 2" step - maintaining level pelvis + opp LE hip flexion/extension swing and hip abduction swing x 10 each; attempted hip hike but pt subsituting with knee flexion/extension      Lumbar Exercises: Supine   Bridge with clamshell  15 reps;5 seconds    Bridge with [employer] Limitations  alt hip ABD/ER with red TB      Lumbar Exercises: Sidelying   Clam  Right;Left;15 reps;3 seconds    Clam Limitations  red TB             PT Education - 03/11/19 1445    Education Details  HEP update - alternative hip adductor stretch; strengthening progression    Person(s) Educated  Patient    Methods  Explanation;Demonstration;Handout    Comprehension  Verbalized understanding;Returned  demonstration;Need further instruction          PT Long Term Goals - 03/11/19 1405      PT LONG TERM GOAL #1   Title  Independent with ongoing/advanced HEP    Status  On-going    Target Date  04/22/19      PT LONG TERM GOAL #2   Title  R hip abduction and DF strength >/= 4/5 with remaining LE strength >/= 4+/5 for improved stability    Status  On-going    Target Date  04/22/19      PT LONG TERM GOAL #3   Title  Patient will verbalize/demonstrate understanding of neutral spine posture and proper body mechanics to reduce strain on lumbar spine    Status  On-going    Target Date  04/22/19            Plan - 03/11/19 1405    Clinical Impression Statement  Tyrone Diaz reporting good compliance with HEP and no issues other than not feeling much of s stretch with hip adductor stretch in  kneel lunge position - reviewed stretch, correcting alignment and technique with patient noting better stretch. Also provided instruction in alternative version of hip adductor stretch from seated position with pt noting better stretch with this version. Progressed core and proximal LE strengthening targeting glute medius and hip abduction strength to improve stability and reduce perceived limp with gait with good tolerance - HEP instructions provided for most exercises.    Comorbidities  lumbar stenosis, cervical arthritis, anxiety, hypothroidism    Rehab Potential  Excellent    PT Frequency  1x / week    PT Duration  6 weeks    PT Next Visit Plan  Review initial PT HEP as well as patient's personal exercise routine; lumbar and proximal LE flexibility; lumbar stabilization; LE strengthening; manal therapy & modalities PRN    Consulted and Agree with Plan of Care  Patient       Patient will benefit from skilled therapeutic intervention in order to improve the following deficits and impairments:  Impaired flexibility, Increased muscle spasms, Decreased strength, Postural dysfunction, Improper body  mechanics, Decreased activity tolerance, Decreased balance  Visit Diagnosis: 1. Muscle weakness (generalized)   2. Other symptoms and signs involving the musculoskeletal system        Problem List Patient Active Problem List   Diagnosis Date Noted  . PCP NOTES >>>>>>>>>>>>>>>>> 10/17/2016  . Erectile dysfunction 04/13/2015  . Hyperglycemia 10/13/2014  .  GERD (gastroesophageal reflux disease) 10/13/2014  . Edema leg 07/02/2014  . Palpitations 07/02/2014  . Annual physical exam 08/05/2012  . h/o goiter, remote Bx 08/05/2012  . Allergic rhinitis 12/28/2011  . Hypogonadism in male 04/28/2010  . Vitamin D deficiency 04/28/2010  . Dyslipidemia 04/28/2010  . 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 IBS (irritable bowel syndrome) 04/28/2010  . Anxiety state 07/23/2008  . GLAUCOMA, BILATERAL 07/23/2008  . PROCTITIS- anal pruritus 07/23/2008  . Spinal stenosis 07/23/2008    Percival Spanish, PT, MPT 03/11/2019, 3:53 PM  [facility] [address]  [place], [place], [address] Phone: [phone]   Fax:  [phone]  Name: Tyrone Diaz MRN: [id number] Date of Birth: December 13, 1940  PHYSICAL THERAPY DISCHARGE SUMMARY  Visits from Start of Care: 2  Current functional level related to goals / functional outcomes:   Refer to above clinical impression for status as of last visit on 03/11/2019. Message received from patient on 03/21/2019 stating he did not feel need for any further PT vists but requesting to keep chart open for 30 days in case of change in status. He has not needed to return to PT in 30 days, therefore will proceed with discharge from PT for this episode.   Remaining deficits:   As above   Education / Equipment:   HEP  Plan: Patient agrees to discharge.  Patient goals were partially met. Patient is being discharged due to being pleased with the current functional level.  ?????     Percival Spanish, PT, MPT 04/10/19, 1:40 PM  [facility] [address]  [place], [place], [address] Phone: [phone]   Fax:  [phone]

## 2019-03-17 DIAGNOSIS — M4807 Spinal stenosis, lumbosacral region: Secondary | ICD-10-CM | POA: Diagnosis not present

## 2019-03-17 DIAGNOSIS — M62830 Muscle spasm of back: Secondary | ICD-10-CM | POA: Diagnosis not present

## 2019-03-17 DIAGNOSIS — G894 Chronic pain syndrome: Secondary | ICD-10-CM | POA: Diagnosis not present

## 2019-03-17 DIAGNOSIS — G4700 Insomnia, unspecified: Secondary | ICD-10-CM | POA: Diagnosis not present

## 2019-03-18 ENCOUNTER — Encounter: Payer: Medicare Other | Admitting: Physical Therapy

## 2019-03-21 ENCOUNTER — Encounter: Payer: Self-pay | Admitting: Physical Therapy

## 2019-04-01 ENCOUNTER — Ambulatory Visit: Payer: Medicare Other | Admitting: Physical Therapy

## 2019-04-08 ENCOUNTER — Encounter: Payer: Medicare Other | Admitting: Physical Therapy

## 2019-04-16 ENCOUNTER — Encounter: Payer: Self-pay | Admitting: Internal Medicine

## 2019-04-22 ENCOUNTER — Ambulatory Visit: Payer: Medicare Other | Admitting: Physical Therapy

## 2019-05-08 DIAGNOSIS — G894 Chronic pain syndrome: Secondary | ICD-10-CM | POA: Diagnosis not present

## 2019-05-08 DIAGNOSIS — M4807 Spinal stenosis, lumbosacral region: Secondary | ICD-10-CM | POA: Diagnosis not present

## 2019-05-08 DIAGNOSIS — M62830 Muscle spasm of back: Secondary | ICD-10-CM | POA: Diagnosis not present

## 2019-05-08 DIAGNOSIS — G4700 Insomnia, unspecified: Secondary | ICD-10-CM | POA: Diagnosis not present

## 2019-07-02 ENCOUNTER — Telehealth: Payer: Self-pay | Admitting: Internal Medicine

## 2019-07-02 NOTE — Telephone Encounter (Signed)
Spoke with Tyrone Diaz regarding AWV. Patient stated that he will give office a call back to schedule his appointment. SF

## 2019-07-03 DIAGNOSIS — M4807 Spinal stenosis, lumbosacral region: Secondary | ICD-10-CM | POA: Diagnosis not present

## 2019-07-03 DIAGNOSIS — G894 Chronic pain syndrome: Secondary | ICD-10-CM | POA: Diagnosis not present

## 2019-07-03 DIAGNOSIS — M62830 Muscle spasm of back: Secondary | ICD-10-CM | POA: Diagnosis not present

## 2019-07-03 DIAGNOSIS — G4700 Insomnia, unspecified: Secondary | ICD-10-CM | POA: Diagnosis not present

## 2019-09-18 DIAGNOSIS — H25013 Cortical age-related cataract, bilateral: Secondary | ICD-10-CM | POA: Diagnosis not present

## 2019-09-18 DIAGNOSIS — H40021 Open angle with borderline findings, high risk, right eye: Secondary | ICD-10-CM | POA: Diagnosis not present

## 2019-09-18 DIAGNOSIS — H2513 Age-related nuclear cataract, bilateral: Secondary | ICD-10-CM | POA: Diagnosis not present

## 2019-09-18 DIAGNOSIS — H401121 Primary open-angle glaucoma, left eye, mild stage: Secondary | ICD-10-CM | POA: Diagnosis not present

## 2019-11-21 DIAGNOSIS — C4441 Basal cell carcinoma of skin of scalp and neck: Secondary | ICD-10-CM | POA: Diagnosis not present

## 2019-11-21 DIAGNOSIS — D1801 Hemangioma of skin and subcutaneous tissue: Secondary | ICD-10-CM | POA: Diagnosis not present

## 2019-11-21 DIAGNOSIS — L570 Actinic keratosis: Secondary | ICD-10-CM | POA: Diagnosis not present

## 2019-11-21 DIAGNOSIS — D485 Neoplasm of uncertain behavior of skin: Secondary | ICD-10-CM | POA: Diagnosis not present

## 2019-11-27 ENCOUNTER — Ambulatory Visit: Payer: Medicare Other | Attending: Internal Medicine

## 2019-11-27 DIAGNOSIS — Z23 Encounter for immunization: Secondary | ICD-10-CM

## 2019-11-27 NOTE — Progress Notes (Signed)
   Covid-19 Vaccination Clinic  Name:  Tyrone Diaz    MRN: [id number] DOB: 1940/12/31  11/27/2019  Tyrone Diaz was observed post Covid-19 immunization for 30 minutes based on pre-vaccination screening without incident. He was provided with Vaccine Information Sheet and instruction to access the V-Safe system.   Tyrone Diaz was instructed to call 911 with any severe reactions post vaccine: Marland Kitchen Difficulty breathing  . Swelling of face and throat  . A fast heartbeat  . A bad rash all over body  . Dizziness and weakness   Immunizations Administered    Name Date Dose VIS Date Route   Pfizer COVID-19 Vaccine 11/27/2019  2:11 PM 0.3 mL 08/22/2019 Intramuscular   Manufacturer: [place]   Lot: [id number]   [place]: [id number]

## 2019-12-01 DIAGNOSIS — C4491 Basal cell carcinoma of skin, unspecified: Secondary | ICD-10-CM | POA: Insufficient documentation

## 2019-12-22 ENCOUNTER — Ambulatory Visit: Payer: Medicare Other | Attending: Internal Medicine

## 2019-12-22 DIAGNOSIS — Z23 Encounter for immunization: Secondary | ICD-10-CM

## 2019-12-22 NOTE — Progress Notes (Signed)
   Covid-19 Vaccination Clinic  Name:  Tyrone Diaz    MRN: [id number] DOB: 1941/03/25  12/22/2019  Mr. Bratland was observed post Covid-19 immunization for 15 minutes without incident. He was provided with Vaccine Information Sheet and instruction to access the V-Safe system.   Mr. Speier was instructed to call 911 with any severe reactions post vaccine: Marland Kitchen Difficulty breathing  . Swelling of face and throat  . A fast heartbeat  . A bad rash all over body  . Dizziness and weakness   Immunizations Administered    Name Date Dose VIS Date Route   Pfizer COVID-19 Vaccine 12/22/2019  2:50 PM 0.3 mL 08/22/2019 Intramuscular   Manufacturer: [place]   Lot: [id number]   [place]: [id number]

## 2020-01-21 DIAGNOSIS — G894 Chronic pain syndrome: Secondary | ICD-10-CM | POA: Diagnosis not present

## 2020-01-21 DIAGNOSIS — G4700 Insomnia, unspecified: Secondary | ICD-10-CM | POA: Diagnosis not present

## 2020-01-21 DIAGNOSIS — M62830 Muscle spasm of back: Secondary | ICD-10-CM | POA: Diagnosis not present

## 2020-01-21 DIAGNOSIS — M4807 Spinal stenosis, lumbosacral region: Secondary | ICD-10-CM | POA: Diagnosis not present

## 2020-02-23 DIAGNOSIS — L820 Inflamed seborrheic keratosis: Secondary | ICD-10-CM | POA: Diagnosis not present

## 2020-02-23 DIAGNOSIS — L821 Other seborrheic keratosis: Secondary | ICD-10-CM | POA: Diagnosis not present

## 2020-02-23 DIAGNOSIS — Z85828 Personal history of other malignant neoplasm of skin: Secondary | ICD-10-CM | POA: Diagnosis not present

## 2020-02-23 DIAGNOSIS — L570 Actinic keratosis: Secondary | ICD-10-CM | POA: Diagnosis not present

## 2020-03-17 DIAGNOSIS — G894 Chronic pain syndrome: Secondary | ICD-10-CM | POA: Diagnosis not present

## 2020-03-17 DIAGNOSIS — G4700 Insomnia, unspecified: Secondary | ICD-10-CM | POA: Diagnosis not present

## 2020-03-17 DIAGNOSIS — M62830 Muscle spasm of back: Secondary | ICD-10-CM | POA: Diagnosis not present

## 2020-03-17 DIAGNOSIS — M4807 Spinal stenosis, lumbosacral region: Secondary | ICD-10-CM | POA: Diagnosis not present

## 2020-03-31 ENCOUNTER — Encounter: Payer: Self-pay | Admitting: Internal Medicine

## 2020-04-19 ENCOUNTER — Encounter: Payer: Self-pay | Admitting: Internal Medicine

## 2020-04-19 ENCOUNTER — Telehealth: Payer: Self-pay | Admitting: *Deleted

## 2020-04-19 ENCOUNTER — Ambulatory Visit (INDEPENDENT_AMBULATORY_CARE_PROVIDER_SITE_OTHER): Payer: Medicare Other | Admitting: Internal Medicine

## 2020-04-19 ENCOUNTER — Other Ambulatory Visit: Payer: Self-pay

## 2020-04-19 VITALS — BP 115/68 | HR 60 | Temp 98.9°F | Resp 12 | Ht 71.0 in | Wt 202.8 lb

## 2020-04-19 DIAGNOSIS — E7800 Pure hypercholesterolemia, unspecified: Secondary | ICD-10-CM | POA: Diagnosis not present

## 2020-04-19 DIAGNOSIS — R079 Chest pain, unspecified: Secondary | ICD-10-CM

## 2020-04-19 DIAGNOSIS — E785 Hyperlipidemia, unspecified: Secondary | ICD-10-CM

## 2020-04-19 DIAGNOSIS — R739 Hyperglycemia, unspecified: Secondary | ICD-10-CM | POA: Diagnosis not present

## 2020-04-19 DIAGNOSIS — E559 Vitamin D deficiency, unspecified: Secondary | ICD-10-CM

## 2020-04-19 DIAGNOSIS — I208 Other forms of angina pectoris: Secondary | ICD-10-CM | POA: Diagnosis not present

## 2020-04-19 LAB — COMPREHENSIVE METABOLIC PANEL
ALT: 20 U/L (ref 0–53)
AST: 17 U/L (ref 0–37)
Albumin: 4.2 g/dL (ref 3.5–5.2)
Alkaline Phosphatase: 45 U/L (ref 39–117)
BUN: 13 mg/dL (ref 6–23)
CO2: 31 mEq/L (ref 19–32)
Calcium: 9.4 mg/dL (ref 8.4–10.5)
Chloride: 99 mEq/L (ref 96–112)
Creatinine, Ser: 0.98 mg/dL (ref 0.40–1.50)
GFR: 73.79 mL/min (ref >60.00)
Glucose, Bld: 94 mg/dL (ref 70–99)
Potassium: 4.3 mEq/L (ref 3.5–5.1)
Sodium: 135 mEq/L (ref 135–145)
Total Bilirubin: 1.2 mg/dL (ref 0.2–1.2)
Total Protein: 7 g/dL (ref 6.0–8.3)

## 2020-04-19 LAB — CBC WITH DIFFERENTIAL/PLATELET
Basophils Absolute: 0.1 10*3/uL (ref 0.0–0.1)
Basophils Relative: 0.8 % (ref 0.0–3.0)
Eosinophils Absolute: 0.3 10*3/uL (ref 0.0–0.7)
Eosinophils Relative: 4.2 % (ref 0.0–5.0)
HCT: 38.8 % — ABNORMAL LOW (ref 39.0–52.0)
Hemoglobin: 13.3 g/dL (ref 13.0–17.0)
Lymphocytes Relative: 32.2 % (ref 12.0–46.0)
Lymphs Abs: 2 10*3/uL (ref 0.7–4.0)
MCHC: 34.4 g/dL (ref 30.0–36.0)
MCV: 90.8 fl (ref 78.0–100.0)
Monocytes Absolute: 0.5 10*3/uL (ref 0.1–1.0)
Monocytes Relative: 8.1 % (ref 3.0–12.0)
Neutro Abs: 3.4 10*3/uL (ref 1.4–7.7)
Neutrophils Relative %: 54.7 % (ref 43.0–77.0)
Platelets: 269 10*3/uL (ref 150.0–400.0)
RBC: 4.27 Mil/uL (ref 4.22–5.81)
RDW: 14.2 % (ref 11.5–15.5)
WBC: 6.3 10*3/uL (ref 4.0–10.5)

## 2020-04-19 LAB — HEMOGLOBIN A1C: Hgb A1c MFr Bld: 5.9 % (ref 4.6–6.5)

## 2020-04-19 LAB — LIPID PANEL
Cholesterol: 161 mg/dL (ref 0–200)
HDL: 46.2 mg/dL (ref >39.00)
LDL Cholesterol: 95 mg/dL (ref 0–99)
NonHDL: 114.34
Total CHOL/HDL Ratio: 3
Triglycerides: 95 mg/dL (ref 0.0–149.0)
VLDL: 19 mg/dL (ref 0.0–40.0)

## 2020-04-19 LAB — VITAMIN D 25 HYDROXY (VIT D DEFICIENCY, FRACTURES): VITD: 35.18 ng/mL (ref 30.00–100.00)

## 2020-04-19 MED ORDER — ASPIRIN 81 MG PO TBEC: 81.0000 mg | DELAYED_RELEASE_TABLET | Freq: Every day | ORAL | Status: DC

## 2020-04-19 MED ORDER — NITROGLYCERIN 0.4 MG SL SUBL
0.4000 mg | SUBLINGUAL_TABLET | SUBLINGUAL | 0 refills | Status: DC | PRN
Start: 2020-04-19 — End: 2020-07-19

## 2020-04-19 NOTE — Assessment & Plan Note (Signed)
PLAN: Prediabetes: Check A1c Dyslipidemia: Diet controlled, check FLP IBS: Symptoms unchanged. Vitamin D deficiency: On supplements, checking labs Chest pain: As described above, pain is exertional (stops 1 minute after rest), must r/o ischemia  History of prediabetes, dyslipidemia, no smoking in 50 years. EKG today: NSR, unchanged Plan: CBC, BMP, Myoview, ER if symptoms severe.  Aspirin 81 daily, NTG as needed, do not mix with sildenafil Preventive care: Sent Cologuard RTC 3 months

## 2020-04-19 NOTE — Progress Notes (Signed)
Subjective:    Patient ID: Tyrone Diaz, male    DOB: [birth year], 79 y.o.   MRN: [id number]  DOS:  04/19/2020 Type of visit - description: Routine checkup, last office visit 11/2017 Has several concerns. Would like Cologuard ordered. Requesting a vitamin D check. History of IBS, symptoms unchanged (gas/discomfort feeling at the left upper quadrant with radiation to the back).  Also he reports a lack of stamina. After he had his Covid vaccination he was tired.  That is better but since then is having chest pain with a strenuous exercise such as putting his  boat on the trailer. The pain is located on the left lower anterior chest, no radiation, no associated shortness of breath, nausea, sweats or palpitations. After he stops the strenuous physical activity the chest pain resolves within a minute.   Review of Systems Denies nausea, vomiting, diarrhea.  No blood in the stools No exertional cough or wheezing   Past Medical History:  Diagnosis Date  . ANEMIA   . ANXIETY   . ARTHRITIS, CERVICAL SPINE   . BCC (basal cell carcinoma of skin)   . Cataracts, bilateral   . GERD   . GLAUCOMA, BILATERAL   . History of stress test 12/2001   Showed normal perfusion with mild apical thinning.  Marland Kitchen History of stress test 11/2007   Post stress myocardial perfusion images show a normal pattern of perfusion in the all regions, the post stress left ventricular is normal in size. There is no scintigraphic evidence of inducible myocardial ischemia. The post EF 64%  . Hx of echocardiogram 12/2001   Had previously shown mild MR as well as mild TR with normal systolic function.  Marland Kitchen Hx of echocardiogram 11/2006   5-years follow-up, this showed normal systolic as well as diastolic function. There was suggestion of flat mitral valve leaflet coaptation with borderline anterior mitral valve leaflet prolapse with mild associated mitral valve prolapse. He had mild TR. There was very mild aortic valve sclerosis  without stenosis or insufficiency.  Marland Kitchen HYPERLIPIDEMIA   . HYPOTHYROIDISM   . PROCTITIS   . TESTICULAR HYPOFUNCTION   . Unspecified vitamin D deficiency   . VARICOSE VEINS, LOWER EXTREMITIES     Past Surgical History:  Procedure Laterality Date  . DENTAL SURGERY    . ear injury     piece of ear plug dislodged from ear  . TONSILECTOMY, ADENOIDECTOMY, BILATERAL MYRINGOTOMY AND TUBES  1943  . WISDOM TOOTH EXTRACTION      Allergies as of 04/19/2020      Reactions   Peanut-containing Drug Products    Gastrointestinal symptoms   Wasp Venom Hives, Itching      Medication List       Accurate as of April 19, 2020  9:03 PM. If you have any questions, ask your nurse or doctor.        STOP taking these medications   EPINEPHrine 0.3 mg/0.3 mL Soaj injection Commonly known as: EpiPen 2-Pak Stopped by: Kathlene November, MD   gabapentin 100 MG capsule Commonly known as: NEURONTIN Stopped by: Kathlene November, MD   HYDROcodone-acetaminophen 5-325 MG tablet Commonly known as: NORCO/VICODIN Stopped by: Kathlene November, MD     TAKE these medications   aspirin 81 MG EC tablet Commonly known as: Aspirin 81 Take 1 tablet (81 mg total) by mouth daily. Swallow whole. Started by: Kathlene November, MD   azelastine 0.1 % nasal spray Commonly known as: ASTELIN Place 2 sprays into both nostrils 2 (  two) times daily.   cholecalciferol 1000 units tablet Commonly known as: VITAMIN D Take 1,000 Units by mouth daily.   diazepam 5 MG tablet Commonly known as: VALIUM Take 1 tablet (5 mg total) by mouth daily as needed for anxiety.   fluticasone 50 MCG/ACT nasal spray Commonly known as: FLONASE Place 1 spray into both nostrils daily.   latanoprost 0.005 % ophthalmic solution Commonly known as: XALATAN Place 1 drop into both eyes at bedtime.   nitroGLYCERIN 0.4 MG SL tablet Commonly known as: NITROSTAT Place 1 tablet (0.4 mg total) under the tongue every 5 (five) minutes as needed for chest pain. Started by: Kathlene November, MD   sildenafil 20 MG tablet Commonly known as: REVATIO Take 3-4 tablets (60-80 mg total) by mouth daily as needed.   timolol 0.5 % ophthalmic solution Commonly known as: TIMOPTIC          Objective:   Physical Exam BP 115/68 (BP Location: Left Arm, Cuff Size: Large)   Pulse 60   Temp 98.9 F (37.2 C) (Oral)   Resp 12   Ht 5\' 11"  (1.803 m)   Wt 202 lb 12.8 oz (92 kg)   SpO2 98%   BMI 28.28 kg/m  General:   Well developed, NAD, BMI noted.  HEENT:  Normocephalic . Face symmetric, atraumatic Neck: Normal carotid arteries Lungs:  CTA B Normal respiratory effort, no intercostal retractions, no accessory muscle use. Heart: RRR,  no murmur.  Abdomen:  Not distended, soft, non-tender. No rebound or rigidity.   Skin: Not pale. Not jaundice Lower extremities: no pretibial edema bilaterally  Neurologic:  alert & oriented X3.  Speech normal, gait appropriate for age and unassisted Psych--  Cognition and judgment appear intact.  Cooperative with normal attention span and concentration.  Behavior appropriate. No anxious or depressed appearing.     Assessment     Assessment Prediabetes Dyslipidemia Anxiety - on valium rx by pcp, get UDS @ pain mngmt  GI: GERD, IBS, several EGDs before  Allergies : Peanuts, pollen, sunlight, trees,(h/o anal pruritus stopped after peanuts dc from diet) Hypogonadism -- free T wnl 2016  ED, decreased libido MSK: spinal stenosis, Dr Greta Doom.  Microdiscectomy 10-2018 Glaucoma Varicose veins H/o goiter, remote bx H/o low vit d H/p palpitations, normal echo 2008, (-) stress test 2009   PLAN: Prediabetes: Check A1c Dyslipidemia: Diet controlled, check FLP IBS: Symptoms unchanged. Vitamin D deficiency: On supplements, checking labs Chest pain: As described above, pain is exertional (stops 1 minute after rest), must r/o ischemia  History of prediabetes, dyslipidemia, no smoking in 50 years. EKG today: NSR, unchanged Plan: CBC, BMP,  Myoview, ER if symptoms severe.  Aspirin 81 daily, NTG as needed, do not mix with sildenafil Preventive care: Sent Cologuard RTC 3 months  This visit occurred during the SARS-CoV-2 public health emergency.  Safety protocols were in place, including screening questions prior to the visit, additional usage of staff PPE, and extensive cleaning of exam room while observing appropriate contact time as indicated for disinfecting solutions.

## 2020-04-19 NOTE — Telephone Encounter (Signed)
Cologuard order placed.

## 2020-04-19 NOTE — Patient Instructions (Addendum)
Start taking an aspirin  If you have persistent or severe chest pain, start nitroglycerin under your tongue every 5 minutes, up to 3 doses if the chest pain continue.  Call 911  Do not take nitroglycerin if you are ready to sildenafil   GO TO THE LAB : Get the blood work     [place], [place] back for a checkup in 3 months

## 2020-04-25 ENCOUNTER — Encounter: Payer: Self-pay | Admitting: Internal Medicine

## 2020-04-25 DIAGNOSIS — Z1211 Encounter for screening for malignant neoplasm of colon: Secondary | ICD-10-CM | POA: Diagnosis not present

## 2020-04-25 LAB — COLOGUARD
Cologuard: NEGATIVE
Cologuard: NEGATIVE

## 2020-04-27 ENCOUNTER — Ambulatory Visit (HOSPITAL_COMMUNITY): Payer: Medicare Other | Attending: Cardiology

## 2020-04-27 ENCOUNTER — Other Ambulatory Visit: Payer: Self-pay

## 2020-04-27 DIAGNOSIS — I208 Other forms of angina pectoris: Secondary | ICD-10-CM | POA: Diagnosis not present

## 2020-04-27 LAB — MYOCARDIAL PERFUSION IMAGING
LV dias vol: 105 mL (ref 62–150)
LV sys vol: 40 mL
Peak HR: 74 {beats}/min
Rest HR: 53 {beats}/min
SDS: 0
SRS: 0
SSS: 0
TID: 1.02

## 2020-04-27 MED ORDER — REGADENOSON 0.4 MG/5ML IV SOLN
0.4000 mg | Freq: Once | INTRAVENOUS | Status: AC
Start: 2020-04-27 — End: 2020-04-27
  Administered 2020-04-27: 0.4 mg via INTRAVENOUS

## 2020-04-27 MED ORDER — TECHNETIUM TC 99M TETROFOSMIN IV KIT
10.8000 | PACK | Freq: Once | INTRAVENOUS | Status: AC | PRN
  Administered 2020-04-27: 10.8 via INTRAVENOUS
  Filled 2020-04-27: qty 11

## 2020-04-27 MED ORDER — TECHNETIUM TC 99M TETROFOSMIN IV KIT
31.6000 | PACK | Freq: Once | INTRAVENOUS | Status: AC | PRN
  Administered 2020-04-27: 31.6 via INTRAVENOUS
  Filled 2020-04-27: qty 32

## 2020-05-01 LAB — EXTERNAL GENERIC LAB PROCEDURE: COLOGUARD: NEGATIVE

## 2020-05-01 LAB — COLOGUARD: COLOGUARD: NEGATIVE

## 2020-05-03 ENCOUNTER — Encounter: Payer: Self-pay | Admitting: Internal Medicine

## 2020-05-12 DIAGNOSIS — M62830 Muscle spasm of back: Secondary | ICD-10-CM | POA: Diagnosis not present

## 2020-05-12 DIAGNOSIS — M4807 Spinal stenosis, lumbosacral region: Secondary | ICD-10-CM | POA: Diagnosis not present

## 2020-05-12 DIAGNOSIS — G4700 Insomnia, unspecified: Secondary | ICD-10-CM | POA: Diagnosis not present

## 2020-05-12 DIAGNOSIS — G894 Chronic pain syndrome: Secondary | ICD-10-CM | POA: Diagnosis not present

## 2020-05-27 ENCOUNTER — Telehealth: Payer: Self-pay | Admitting: Internal Medicine

## 2020-05-27 NOTE — Progress Notes (Signed)
  Chronic Care Management   Outreach Note  05/27/2020 Name: Tyrone Diaz MRN: [id number] DOB: 1941-02-10  Referred by: Colon Branch, MD Reason for referral : No chief complaint on file.   An unsuccessful telephone outreach was attempted today. The patient was referred to the pharmacist for assistance with care management and care coordination.   Follow Up Plan:   Carley Perdue [employer] Scheduler

## 2020-05-28 ENCOUNTER — Other Ambulatory Visit: Payer: Self-pay | Admitting: Internal Medicine

## 2020-05-29 ENCOUNTER — Encounter: Payer: Self-pay | Admitting: Internal Medicine

## 2020-05-31 MED ORDER — SILDENAFIL CITRATE 20 MG PO TABS: 60.0000 mg | ORAL_TABLET | Freq: Every day | ORAL | 6 refills | Status: DC | PRN

## 2020-06-16 ENCOUNTER — Telehealth: Payer: Self-pay | Admitting: Internal Medicine

## 2020-06-16 NOTE — Progress Notes (Signed)
  Chronic Care Management   Note  06/16/2020 Name: Tyrone Diaz MRN: [id number] DOB: July 26, 1941  Tyrone Diaz is a 79 y.o. year old male who is a primary care patient of Colon Branch, MD. I reached out to Brynda Peon by phone today in response to a referral sent by Tyrone Diaz PCP, Colon Branch, MD.   Tyrone Diaz was given information about Chronic Care Management services today including:  1. CCM service includes personalized support from designated clinical staff supervised by his physician, including individualized plan of care and coordination with other care providers 2. 24/7 contact phone numbers for assistance for urgent and routine care needs. 3. Service will only be billed when office clinical staff spend 20 minutes or more in a month to coordinate care. 4. Only one practitioner may furnish and bill the service in a calendar month. 5. The patient may stop CCM services at any time (effective at the end of the month) by phone call to the office staff.   Patient agreed to services and verbal consent obtained.   Follow up plan:   Tyrone Diaz [employer] Scheduler

## 2020-07-07 DIAGNOSIS — Z79891 Long term (current) use of opiate analgesic: Secondary | ICD-10-CM | POA: Diagnosis not present

## 2020-07-07 DIAGNOSIS — G4700 Insomnia, unspecified: Secondary | ICD-10-CM | POA: Diagnosis not present

## 2020-07-07 DIAGNOSIS — M62830 Muscle spasm of back: Secondary | ICD-10-CM | POA: Diagnosis not present

## 2020-07-07 DIAGNOSIS — M4807 Spinal stenosis, lumbosacral region: Secondary | ICD-10-CM | POA: Diagnosis not present

## 2020-07-07 DIAGNOSIS — G894 Chronic pain syndrome: Secondary | ICD-10-CM | POA: Diagnosis not present

## 2020-07-19 ENCOUNTER — Encounter: Payer: Self-pay | Admitting: Internal Medicine

## 2020-07-19 ENCOUNTER — Other Ambulatory Visit: Payer: Self-pay

## 2020-07-19 ENCOUNTER — Ambulatory Visit (INDEPENDENT_AMBULATORY_CARE_PROVIDER_SITE_OTHER): Payer: Medicare Other | Admitting: Internal Medicine

## 2020-07-19 VITALS — BP 135/81 | HR 67 | Temp 98.0°F | Resp 16 | Ht 71.0 in | Wt 204.5 lb

## 2020-07-19 DIAGNOSIS — R079 Chest pain, unspecified: Secondary | ICD-10-CM

## 2020-07-19 DIAGNOSIS — E559 Vitamin D deficiency, unspecified: Secondary | ICD-10-CM

## 2020-07-19 DIAGNOSIS — R7989 Other specified abnormal findings of blood chemistry: Secondary | ICD-10-CM | POA: Diagnosis not present

## 2020-07-19 DIAGNOSIS — E785 Hyperlipidemia, unspecified: Secondary | ICD-10-CM

## 2020-07-19 DIAGNOSIS — N5089 Other specified disorders of the male genital organs: Secondary | ICD-10-CM | POA: Diagnosis not present

## 2020-07-19 DIAGNOSIS — I208 Other forms of angina pectoris: Secondary | ICD-10-CM | POA: Diagnosis not present

## 2020-07-19 DIAGNOSIS — R739 Hyperglycemia, unspecified: Secondary | ICD-10-CM

## 2020-07-19 NOTE — Patient Instructions (Signed)
    GO TO THE FRONT DESK, PLEASE SCHEDULE YOUR APPOINTMENTS Come back for   a checkup in 6 months   STOP BY THE FIRST FLOOR: Schedule bone density test

## 2020-07-19 NOTE — Progress Notes (Signed)
Subjective:    Patient ID: Tyrone Diaz, male    DOB: 1941-04-20, 79 y.o.   MRN: [id number]  DOS:  07/19/2020 Type of visit - description: f/u  Since the last office visit he is doing well. Denies chest pain or difficulty breathing Exercises regularly   Review of Systems See above   Past Medical History:  Diagnosis Date  . ANEMIA   . ANXIETY   . ARTHRITIS, CERVICAL SPINE   . BCC (basal cell carcinoma of skin)   . Cataracts, bilateral   . GERD   . GLAUCOMA, BILATERAL   . History of stress test 12/2001   Showed normal perfusion with mild apical thinning.  Marland Kitchen History of stress test 11/2007   Post stress myocardial perfusion images show a normal pattern of perfusion in the all regions, the post stress left ventricular is normal in size. There is no scintigraphic evidence of inducible myocardial ischemia. The post EF 64%  . Hx of echocardiogram 12/2001   Had previously shown mild MR as well as mild TR with normal systolic function.  Marland Kitchen Hx of echocardiogram 11/2006   5-years follow-up, this showed normal systolic as well as diastolic function. There was suggestion of flat mitral valve leaflet coaptation with borderline anterior mitral valve leaflet prolapse with mild associated mitral valve prolapse. He had mild TR. There was very mild aortic valve sclerosis without stenosis or insufficiency.  Marland Kitchen HYPERLIPIDEMIA   . HYPOTHYROIDISM   . PROCTITIS   . TESTICULAR HYPOFUNCTION   . Unspecified vitamin D deficiency   . VARICOSE VEINS, LOWER EXTREMITIES     Past Surgical History:  Procedure Laterality Date  . DENTAL SURGERY    . ear injury     piece of ear plug dislodged from ear  . TONSILECTOMY, ADENOIDECTOMY, BILATERAL MYRINGOTOMY AND TUBES  1943  . WISDOM TOOTH EXTRACTION      Allergies as of 07/19/2020      Reactions   Peanut-containing Drug Products    Gastrointestinal symptoms   Wasp Venom Hives, Itching      Medication List       Accurate as of July 19, 2020  11:18 AM. If you have any questions, ask your nurse or doctor.        aspirin 81 MG EC tablet Commonly known as: Aspirin 81 Take 1 tablet (81 mg total) by mouth daily. Swallow whole.   azelastine 0.1 % nasal spray Commonly known as: ASTELIN Place 2 sprays into both nostrils 2 (two) times daily.   cholecalciferol 1000 units tablet Commonly known as: VITAMIN D Take 1,000 Units by mouth daily.   diazepam 5 MG tablet Commonly known as: VALIUM Take 1 tablet (5 mg total) by mouth daily as needed for anxiety.   fluticasone 50 MCG/ACT nasal spray Commonly known as: FLONASE Place 1 spray into both nostrils daily.   latanoprost 0.005 % ophthalmic solution Commonly known as: XALATAN Place 1 drop into both eyes at bedtime.   nitroGLYCERIN 0.4 MG SL tablet Commonly known as: NITROSTAT Place 1 tablet (0.4 mg total) under the tongue every 5 (five) minutes as needed for chest pain.   sildenafil 20 MG tablet Commonly known as: REVATIO Take 3-4 tablets (60-80 mg total) by mouth daily as needed.   timolol 0.5 % ophthalmic solution Commonly known as: TIMOPTIC          Objective:   Physical Exam BP 135/81 (BP Location: Left Arm, Patient Position: Sitting, Cuff Size: Normal)   Pulse 67  Temp 98 F (36.7 C) (Oral)   Resp 16   Ht 5\' 11"  (1.803 m)   Wt 204 lb 8 oz (92.8 kg)   SpO2 96%   BMI 28.52 kg/m  General: Well developed, NAD, BMI noted Neck: No  thyromegaly  HEENT:  Normocephalic . Face symmetric, atraumatic Lungs:  CTA B Normal respiratory effort, no intercostal retractions, no accessory muscle use. Heart: RRR,  no murmur.  Abdomen:  Not distended, soft, non-tender. No rebound or rigidity.   Lower extremities: no pretibial edema bilaterally  Skin: Exposed areas without rash. Not pale. Not jaundice Neurologic:  alert & oriented X3.  Speech normal, gait appropriate for age and unassisted Strength symmetric and appropriate for age.  Psych: Cognition and judgment  appear intact.  Cooperative with normal attention span and concentration.  Behavior appropriate. No anxious or depressed appearing.     Assessment    ASSESSMENT Prediabetes Dyslipidemia Anxiety - on valium rx by pcp, get UDS @ pain mngmt  GI: GERD, IBS, several EGDs before  Allergies : Peanuts, pollen, sunlight, trees,(h/o anal pruritus stopped after peanuts dc from diet) Hypogonadism -- free T wnl 2016  ED, decreased libido MSK: spinal stenosis, Dr Greta Doom.  Microdiscectomy 10-2018 Glaucoma Varicose veins H/o goiter, remote bx H/o low vit d H/p palpitations, normal echo 2008, (-) stress test 2009   PLAN: Preventive care reviewed Pre -diabetes: Last A1c very good. Dyslipidemia: Diet controlled, last FLP very good Chest pain: Myoview  04/27/2020 neg.  Symptoms resolved Aspirin: Okay to stop due to new recommendations RTC 6 months       This visit occurred during the SARS-CoV-2 public health emergency.  Safety protocols were in place, including screening questions prior to the visit, additional usage of staff PPE, and extensive cleaning of exam room while observing appropriate contact time as indicated for disinfecting solutions.

## 2020-07-19 NOTE — Progress Notes (Signed)
Pre visit review using our clinic review tool, if applicable. No additional management support is needed unless otherwise documented below in the visit note. 

## 2020-07-20 ENCOUNTER — Ambulatory Visit (HOSPITAL_BASED_OUTPATIENT_CLINIC_OR_DEPARTMENT_OTHER)
Admission: RE | Admit: 2020-07-20 | Discharge: 2020-07-20 | Disposition: A | Discharge status: Home / Self Care | Payer: Medicare Other | Source: Ambulatory Visit | Attending: Internal Medicine | Admitting: Internal Medicine

## 2020-07-20 DIAGNOSIS — R7989 Other specified abnormal findings of blood chemistry: Secondary | ICD-10-CM | POA: Diagnosis not present

## 2020-07-20 DIAGNOSIS — E58 Dietary calcium deficiency: Secondary | ICD-10-CM | POA: Insufficient documentation

## 2020-07-20 DIAGNOSIS — N5089 Other specified disorders of the male genital organs: Secondary | ICD-10-CM | POA: Diagnosis not present

## 2020-07-20 DIAGNOSIS — Z13820 Encounter for screening for osteoporosis: Secondary | ICD-10-CM | POA: Insufficient documentation

## 2020-07-20 NOTE — Assessment & Plan Note (Addendum)
Preventive care reviewed Pre -diabetes: Last A1c very good. Dyslipidemia: Diet controlled, last FLP very good Chest pain: Myoview  04/27/2020 neg.  Symptoms resolved Aspirin: Okay to stop due to new recommendations RTC 6 months

## 2020-07-20 NOTE — Assessment & Plan Note (Signed)
Preventive care reviewed -Td 2018 -  pnm 23: 2011; prevnar 2016 -zostavax 2011 - shingrex: Pending -Had 2 Covid vaccines, hesitant to get a booster -Prostate cancer screening: All previous PSAs wnl, we agreed to discontinue screening -CCS:  Colonoscopy 2002: Diverticuli, polyp: BX  benign polypoid colonic mucosa. See phone note from 10/14/2014, we agreed to proceed with a cologuard: (-) 10-2014. (-) 04/25/2020 -Request DEXA, he is very active like to see if he has osteoporosis, test ordered.

## 2020-08-09 ENCOUNTER — Telehealth: Payer: Self-pay | Admitting: Pharmacist

## 2020-08-09 NOTE — Progress Notes (Addendum)
    Chronic Care Management Pharmacy Assistant   Name: Tyrone Diaz  MRN: [id number] DOB: [birth year]  Reason for Encounter: Initial Questions   PCP : Colon Branch, MD  Allergies:   Allergies  Allergen Reactions   Peanut-Containing Drug Products     Gastrointestinal symptoms   Wasp Venom Hives and Itching    Medications: Outpatient Encounter Medications as of 08/09/2020  Medication Sig   azelastine (ASTELIN) 0.1 % nasal spray Place 2 sprays into both nostrils 2 (two) times daily.   cholecalciferol (VITAMIN D) 1000 units tablet Take 1,000 Units by mouth daily.   diazepam (VALIUM) 5 MG tablet Take 1 tablet (5 mg total) by mouth daily as needed for anxiety. (Patient not taking: Reported on 07/19/2020)   fluticasone (FLONASE) 50 MCG/ACT nasal spray Place 1 spray into both nostrils daily.   latanoprost (XALATAN) 0.005 % ophthalmic solution Place 1 drop into both eyes at bedtime.     sildenafil (REVATIO) 20 MG tablet Take 3-4 tablets (60-80 mg total) by mouth daily as needed.   timolol (TIMOPTIC) 0.5 % ophthalmic solution    No facility-administered encounter medications on file as of 08/09/2020.    Current Diagnosis: Patient Active Problem List   Diagnosis Date Noted   BCC (basal cell carcinoma of skin) 12/01/2019   History of lumbar laminectomy 10/28/2018   PCP NOTES >>>>>>>>>>>>>>>>> 10/17/2016   Erectile dysfunction 04/13/2015   Hyperglycemia 10/13/2014   GERD (gastroesophageal reflux disease) 10/13/2014   Edema leg 07/02/2014   Palpitations 07/02/2014   Annual physical exam 08/05/2012   h/o goiter, remote Bx 08/05/2012   Allergic rhinitis 12/28/2011   Hypogonadism in male 04/28/2010   Vitamin D deficiency 04/28/2010   Dyslipidemia 04/28/2010   VARICOSE VEINS, LOWER EXTREMITIES 04/28/2010   IBS (irritable bowel syndrome) 04/28/2010   Anxiety state 07/23/2008   GLAUCOMA, BILATERAL 07/23/2008   PROCTITIS- anal pruritus 07/23/2008   Spinal stenosis 07/23/2008     Goals Addressed   None    Have you seen any other providers since your last visit? Dr. Greta Doom his pain speciailist  Any changes in your medications or health? Patient recently started taking Gabapentin that was prescribed to him in 2019.  Any side effects from any medications? No  Do you have an symptoms or problems not managed by your medications? No  Any concerns about your health right now? No  Has your provider asked that you check blood pressure, blood sugar, or follow special diet at home? No  Do you get any type of exercise on a regular basis? Yes patient states he is active  Can you think of a goal you would like to reach for your health? Patient would like to lose 10-20 pounds  Do you have any problems getting your medications? No, patient [facility]  Is there anything that you would like to discuss during the appointment? Patient would like to restart Gabapentin mediation. He would like to substitute the Vicodin with Gabapentin, and then at some point not take the Gabapentin.  Documented preliminary medication plan in preparation for patient's initial chronic care management visit  Reminded the patient to please have medications and supplements at the time of his appointment on Wednesday December st at 3:330 pm over the telephone   Follow-Up:  Pharmacist [place], [place] Pharmacist Assistant [phone]  Reviewed by: De Blanch, PharmD, BCACP Clinical Pharmacist [facility] [phone]

## 2020-08-11 ENCOUNTER — Other Ambulatory Visit: Payer: Self-pay

## 2020-08-11 ENCOUNTER — Ambulatory Visit: Payer: Medicare Other | Admitting: Pharmacist

## 2020-08-11 DIAGNOSIS — R739 Hyperglycemia, unspecified: Secondary | ICD-10-CM

## 2020-08-11 DIAGNOSIS — E785 Hyperlipidemia, unspecified: Secondary | ICD-10-CM

## 2020-08-11 NOTE — Chronic Care Management (AMB) (Signed)
Chronic Care Management Pharmacy  Name: Tyrone Diaz  MRN: [id number] DOB: 08-05-1941  Chief Complaint/ HPI  Tyrone Diaz,  79 y.o. , male presents for their Initial CCM visit with the clinical pharmacist via telephone.  PCP : Colon Branch, MD  Their chronic conditions include: Hyperlipidemia, Pre-diabetes, Sciatica Pain, Anxiety, Vitamin D Deficiency, Erectile Dysfunction, Allergic Rhinitis, Glaucoma  Office Visits: 07/19/20: Visit w/ Dr. Larose Kells - D/C aspirin, ordered bone density scan (normal). RTC 6 months.   04/19/20: Visit w/ Dr. Larose Kells - Cologuard ordered. Complaint of chest pain. Prescribed aspirin and NTG.  Consult Visit: 02/23/20: Derm visit w/ Dr. Martinique  Medications: Outpatient Encounter Medications as of 08/11/2020  Medication Sig Note  . cholecalciferol (VITAMIN D) 1000 units tablet Take 1,000 Units by mouth daily.   . diazepam (VALIUM) 5 MG tablet Take 1 tablet (5 mg total) by mouth daily as needed for anxiety.   . fluticasone (FLONASE) 50 MCG/ACT nasal spray Place 1 spray into both nostrils daily. 08/11/2020: In the spring for about 2 months  . HYDROcodone-acetaminophen (NORCO/VICODIN) 5-325 MG tablet Take 1 tablet by mouth 2 (two) times daily. Per Pain Management Dr. Greta Doom   . latanoprost (XALATAN) 0.005 % ophthalmic solution Place 1 drop into both eyes at bedtime.     . sildenafil (REVATIO) 20 MG tablet Take 3-4 tablets (60-80 mg total) by mouth daily as needed.   . timolol (TIMOPTIC) 0.5 % ophthalmic solution Place 1 drop into both eyes daily. AM   . [DISCONTINUED] azelastine (ASTELIN) 0.1 % nasal spray Place 2 sprays into both nostrils 2 (two) times daily. (Patient not taking: Reported on 08/11/2020)    No facility-administered encounter medications on file as of 08/11/2020.   SDOH Screenings   Alcohol Screen:   . Last Alcohol Screening Score (AUDIT): Not on file  Depression (PHQ2-9): Low Risk   . PHQ-2 Score: 0  Financial Resource Strain: Low Risk   .  Difficulty of Paying Living Expenses: Not hard at all  Food Insecurity:   . Worried About Charity fundraiser in the Last Year: Not on file  . Ran Out of Food in the Last Year: Not on file  Housing:   . Last Housing Risk Score: Not on file  Physical Activity:   . Days of Exercise per Week: Not on file  . Minutes of Exercise per Session: Not on file  Social Connections:   . Frequency of Communication with Friends and Family: Not on file  . Frequency of Social Gatherings with Friends and Family: Not on file  . Attends Religious Services: Not on file  . Active Member of Clubs or Organizations: Not on file  . Attends Archivist Meetings: Not on file  . Marital Status: Not on file  Stress:   . Feeling of Stress : Not on file  Tobacco Use: Medium Risk  . Smoking Tobacco Use: Former Smoker  . Smokeless Tobacco Use: Never Used  Transportation Needs:   . Film/video editor (Medical): Not on file  . Lack of Transportation (Non-Medical): Not on file     Current Diagnosis/Assessment:  Goals Addressed            This Visit's Progress   . Chronic Care Management Pharmacy Care Plan       CARE PLAN ENTRY (see longitudinal plan of care for additional care plan information)  Current Barriers:  . Chronic Disease Management support, education, and care coordination needs related to Hyperlipidemia, Pre-diabetes,  Sciatica Pain, Anxiety, Vitamin D Deficiency, Erectile Dysfunction, Allergic Rhinitis, Glaucoma   Hyperlipidemia Lab Results  Component Value Date/Time   LDLCALC 95 04/19/2020 11:39 AM   . Pharmacist Clinical Goal(s): o Over the next 180 days, patient will work with PharmD and providers to maintain LDL goal < 100 . Current regimen:  o Diet and exercise management   . Patient self care activities - Over the next 180 days, patient will: o Maintain LDL less than 100  Pre-Diabetes Lab Results  Component Value Date/Time   HGBA1C 5.9 04/19/2020 11:39 AM   HGBA1C  5.6 11/01/2017 07:49 AM   . Pharmacist Clinical Goal(s): o Over the next 180 days, patient will work with PharmD and providers to maintain A1c goal <6.5% . Current regimen:  o Diet and exercise management   . Interventions: o Discussed a1c goal - Pre-Diabetes a1c range is 5.7% to 6.4% - Usually the full diabetes diagnosis is given when a1c reaches 6.5% or higher . Patient self care activities - Over the next 180 days, patient will: o Maintain a1c less than 6.5%  Health Maintenance  . Pharmacist Clinical Goal(s) o Over the next 180 days, patient will work with PharmD and providers to complete health maintenance screenings/vaccinations . Interventions: o Recommended patient receive flu vaccine (pt declines) o Recommended patient receive Shingrix vaccine (pt to consider) . Patient self care activities - Over the next 180 days, patient will: o Consider completing Shingrix vaccine series    Medication management . Pharmacist Clinical Goal(s): o Over the next 180 days, patient will work with PharmD and providers to maintain optimal medication adherence . Current pharmacy: [facility]  . Interventions o Comprehensive medication review performed. o Continue current medication management strategy . 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 Originally from [place]. Married 40 years Had 3 children, 2 passed away. Has a grandchildren  Typical Tyrone Diaz Does Tyrone Diaz [employer].  Lets me know he doesn't have much time for the visit, because he would like to do some trades before the market closes.  Vitals from  home BP 124/64  Pulse 62  Hyperlipidemia   LDL goal <  100  Last lipids Lab Results  Component Value Date   CHOL 161 04/19/2020   HDL 46.20 04/19/2020   LDLCALC 95 04/19/2020   TRIG 95.0 04/19/2020   CHOLHDL 3 04/19/2020   Hepatic Function Latest Ref Rng & Units 04/19/2020 11/01/2017 10/18/2016  Total Protein 6.0 - 8.3 g/dL 7.0 7.1 6.7  Albumin 3.5 - 5.2 g/dL 4.2 4.2 4.3  AST 0 - 37 U/L _0 ALT 0 - 53 U/L _1 Alk Phosphatase 39 - 117 U/L 45 46 44  Total Bilirubin 0.2 - 1.2 mg/dL 1.2 0.8 0.8  Bilirubin, Direct 0.0 - 0.3 mg/dL - - -     The 10-year ASCVD risk score Mikey Bussing DC Jr., et al., 2013) is: 37.4%   Values used to calculate the score:     Age: 20 years     Sex: Male     Is Non-Hispanic African American: No     Diabetic: No     Tobacco smoker: No     Systolic  Blood Pressure: 135 mmHg     Is BP treated: Yes     HDL Cholesterol: 46.2 mg/dL     Total Cholesterol: 161 mg/dL   Patient has failed these meds in past: None noted  Patient is currently controlled on the following medications:  . None   Plan -Continue control with diet and exercise   Pre-Diabetes   A1c goal <6.5%  Recent Relevant Labs: Lab Results  Component Value Date/Time   HGBA1C 5.9 04/19/2020 11:39 AM   HGBA1C 5.6 11/01/2017 07:49 AM   GFR 73.79 04/19/2020 11:39 AM   GFR 85.98 11/01/2017 07:49 AM    Patient has failed these meds in past: None noted  Patient is currently controlled on the following medications: . None  Goal to lose 10-20lbs   We discussed: a1c goal  Plan -Continue control with diet and exercise   Sciatica Pain   Followed by Pain Management - Dr. Greta Doom  Patient has failed these meds in past: None noted  Patient is currently controlled on the following medications: . Hydrocodone/Acetaminophen 5/325mg  twice daily  Taking gabapentin? Dr. Jerilee Hoh prescribed in 2019. Has restarted recently.  Gabapentin works but clouds his mind Unsure if he would like to proceed with this therapy  Plan -Continue current  medications   Anxiety   Patient has failed these meds in past: None noted  Patient is currently controlled on the following medications:  . Diazepam 5mg  as needed   Uses diazepam about 3 times per week   Plan -Continue current medications   Vitamin D Deficiency    VITD  Date Value Ref Range Status  04/19/2020 35.18 30.00 - 100.00 ng/mL Final    Patient has failed these meds in past: None noted  Patient is currently controlled on the following medications: Marland Kitchen Vitamin D 1000 units daily   Plan -Continue current medications   Erectile Dysfunction    Patient has failed these meds in past: None noted  Patient is currently controlled on the following medications: . Sildenafil 20mg  3-4 tablets as needed   Plan -Continue current medications   Allergic Rhinitis    Patient has failed these meds in past: None noted  Patient is currently controlled on the following medications: . Azelastine 0.1% 2 sprays into both nostrils twice daily . Fluticasone 66mcg 1 spray into both nostrils daily  Needs refill for fluticasone  Plan -Continue current medications  -Coordinate to get patient refill of fluticasone   Glaucoma    Patient has failed these meds in past: None noted  Patient is currently controlled on the following medications: . Latanoprost 0.005% 1 drop into both eyes at bedtime . Timolol 0.5% 1 drop into both eyes in the morning  Saw eye doctor 6 months ago and goes yearly  Plan -Continue current medications  Vaccines   Reviewed and discussed patient's vaccination history.    Immunization History  Administered Date(s) Administered  . Influenza Split 08/05/2012  . Influenza, High Dose Seasonal PF 08/31/2015, 08/01/2016, 06/26/2017  . Influenza,inj,Quad PF,6+ Mos 05/28/2013, 10/13/2014  . Influenza-Unspecified 06/11/2020  . PFIZER SARS-COV-2 Vaccination 11/27/2019, 12/22/2019  . Pneumococcal Conjugate-13 10/13/2014  . Pneumococcal Polysaccharide-23  04/28/2010  . Td 10/17/2016  . Tdap 01/09/2006  . Zoster 04/28/2010   Flu vaccine? No, does not plan to get vaccine   Plan -Recommended patient receive Shingrix vaccine in pharmacy (pt will consider) -Recommended patient receive flu vaccine (pt declines)   Follow up: 6 month phone visit  De Blanch, PharmD, [place] Pharmacist  [facility] [place]

## 2020-08-11 NOTE — Patient Instructions (Addendum)
Visit Information  Goals Addressed            This Visit's Progress   . Chronic Care Management Pharmacy Care Plan       CARE PLAN ENTRY (see longitudinal plan of care for additional care plan information)  Current Barriers:  . Chronic Disease Management support, education, and care coordination needs related to Hyperlipidemia, Pre-diabetes, Sciatica Pain, Anxiety, Vitamin D Deficiency, Erectile Dysfunction, Allergic Rhinitis, Glaucoma   Hyperlipidemia Lab Results  Component Value Date/Time   LDLCALC 95 04/19/2020 11:39 AM   . Pharmacist Clinical Goal(s): o Over the next 180 days, patient will work with PharmD and providers to maintain LDL goal < 100 . Current regimen:  o Diet and exercise management   . Patient self care activities - Over the next 180 days, patient will: o Maintain LDL less than 100  Pre-Diabetes Lab Results  Component Value Date/Time   HGBA1C 5.9 04/19/2020 11:39 AM   HGBA1C 5.6 11/01/2017 07:49 AM   . Pharmacist Clinical Goal(s): o Over the next 180 days, patient will work with PharmD and providers to maintain A1c goal <6.5% . Current regimen:  o Diet and exercise management   . Interventions: o Discussed a1c goal - Pre-Diabetes a1c range is 5.7% to 6.4% - Usually the full diabetes diagnosis is given when a1c reaches 6.5% or higher . Patient self care activities - Over the next 180 days, patient will: o Maintain a1c less than 6.5%  Health Maintenance  . Pharmacist Clinical Goal(s) o Over the next 180 days, patient will work with PharmD and providers to complete health maintenance screenings/vaccinations . Interventions: o Recommended patient receive flu vaccine (pt declines) o Recommended patient receive Shingrix vaccine (pt to consider) . Patient self care activities - Over the next 180 days, patient will: o Consider completing Shingrix vaccine series    Medication management . Pharmacist Clinical Goal(s): o Over the next 180 days, patient  will work with PharmD and providers to maintain optimal medication adherence . Current pharmacy: [facility]  . Interventions o Comprehensive medication review performed. o Continue current medication management strategy . Patient self care activities - Over the next 180 days, patient will: o Focus on medication adherence by filling and taking medications appropriately  o Take medications as prescribed o Report any questions or concerns to PharmD and/or provider(s)  Initial goal documentation        Tyrone Diaz was given information about Chronic Care Management services today including:  1. CCM service includes personalized support from designated clinical staff supervised by his physician, including individualized plan of care and coordination with other care providers 2. 24/7 contact phone numbers for assistance for urgent and routine care needs. 3. Standard insurance, coinsurance, copays and deductibles apply for chronic care management only during months in which we provide at least 20 minutes of these services. Most insurances cover these services at 100%, however patients may be responsible for any copay, coinsurance and/or deductible if applicable. This service may help you avoid the need for more expensive face-to-face services. 4. Only one practitioner may furnish and bill the service in a calendar month. 5. The patient may stop CCM services at any time (effective at the end of the month) by phone call to the office staff.  Patient agreed to services and verbal consent obtained.   The patient verbalized understanding of instructions, educational materials, and care plan provided today and agreed to receive a mailed copy of patient instructions, educational materials, and care plan.  Telephone follow up appointment with pharmacy team member scheduled for: 02/09/2021  Melvenia Beam Milia Warth, PharmD, BCACP Clinical Pharmacist [facility] at [facility] [phone]

## 2020-08-12 ENCOUNTER — Other Ambulatory Visit: Payer: Self-pay

## 2020-08-12 DIAGNOSIS — E7800 Pure hypercholesterolemia, unspecified: Secondary | ICD-10-CM

## 2020-08-12 DIAGNOSIS — R739 Hyperglycemia, unspecified: Secondary | ICD-10-CM

## 2020-08-12 MED ORDER — FLUTICASONE PROPIONATE 50 MCG/ACT NA SUSP: 1.0000 | Freq: Every day | NASAL | 5 refills | Status: AC

## 2020-08-12 NOTE — Progress Notes (Signed)
Done. RX sent into [facility].

## 2020-09-01 DIAGNOSIS — G894 Chronic pain syndrome: Secondary | ICD-10-CM | POA: Diagnosis not present

## 2020-09-01 DIAGNOSIS — G4700 Insomnia, unspecified: Secondary | ICD-10-CM | POA: Diagnosis not present

## 2020-09-01 DIAGNOSIS — M62830 Muscle spasm of back: Secondary | ICD-10-CM | POA: Diagnosis not present

## 2020-09-01 DIAGNOSIS — M4807 Spinal stenosis, lumbosacral region: Secondary | ICD-10-CM | POA: Diagnosis not present

## 2020-10-28 DIAGNOSIS — G894 Chronic pain syndrome: Secondary | ICD-10-CM | POA: Diagnosis not present

## 2020-10-28 DIAGNOSIS — M62830 Muscle spasm of back: Secondary | ICD-10-CM | POA: Diagnosis not present

## 2020-10-28 DIAGNOSIS — G4700 Insomnia, unspecified: Secondary | ICD-10-CM | POA: Diagnosis not present

## 2020-10-28 DIAGNOSIS — M4807 Spinal stenosis, lumbosacral region: Secondary | ICD-10-CM | POA: Diagnosis not present

## 2020-11-03 ENCOUNTER — Telehealth: Payer: Self-pay | Admitting: Pharmacist

## 2020-11-03 NOTE — Progress Notes (Addendum)
    Chronic Care Management Pharmacy Assistant   Name: Tyrone Diaz  MRN: [id number] DOB: 09/22/1940  Reason for Encounter: General Disease State Call  Patient Questions:  1.  Have you seen any other providers since your last visit? No.   2.  Any changes in your medicines or health? No.   PCP : Colon Branch, MD   Their chronic conditions include: Hyperlipidemia, Pre-diabetes, Sciatica Pain, Anxiety, Vitamin D Deficiency, Erectile Dysfunction, Allergic Rhinitis, Glaucoma.  Office Visits: None since 08/11/20  Consults: None since 08/11/20  Allergies:   Allergies  Allergen Reactions   Peanut-Containing Drug Products     Gastrointestinal symptoms   Wasp Venom Hives and Itching    Medications: Outpatient Encounter Medications as of 11/03/2020  Medication Sig   cholecalciferol (VITAMIN D) 1000 units tablet Take 1,000 Units by mouth daily.   diazepam (VALIUM) 5 MG tablet Take 1 tablet (5 mg total) by mouth daily as needed for anxiety.   fluticasone (FLONASE) 50 MCG/ACT nasal spray Place 1 spray into both nostrils daily.   HYDROcodone-acetaminophen (NORCO/VICODIN) 5-325 MG tablet Take 1 tablet by mouth 2 (two) times daily. Per Pain Management Dr. Greta Diaz   latanoprost (XALATAN) 0.005 % ophthalmic solution Place 1 drop into both eyes at bedtime.     sildenafil (REVATIO) 20 MG tablet Take 3-4 tablets (60-80 mg total) by mouth daily as needed.   timolol (TIMOPTIC) 0.5 % ophthalmic solution Place 1 drop into both eyes daily. AM   No facility-administered encounter medications on file as of 11/03/2020.    Current Diagnosis: Patient Active Problem List   Diagnosis Date Noted   BCC (basal cell carcinoma of skin) 12/01/2019   History of lumbar laminectomy 10/28/2018   PCP NOTES >>>>>>>>>>>>>>>>> 10/17/2016   Erectile dysfunction 04/13/2015   Hyperglycemia 10/13/2014   GERD (gastroesophageal reflux disease) 10/13/2014   Edema leg 07/02/2014   Palpitations 07/02/2014   Annual  physical exam 08/05/2012   h/o goiter, remote Bx 08/05/2012   Allergic rhinitis 12/28/2011   Hypogonadism in male 04/28/2010   Vitamin D deficiency 04/28/2010   Dyslipidemia 04/28/2010   VARICOSE VEINS, LOWER EXTREMITIES 04/28/2010   IBS (irritable bowel syndrome) 04/28/2010   Anxiety state 07/23/2008   GLAUCOMA, BILATERAL 07/23/2008   PROCTITIS- anal pruritus 07/23/2008   Spinal stenosis 07/23/2008    Goals Addressed   None    GEN Call: Patient stated he's in the process in moving but other then that he usually walks for about 15 min daily for exercise. Patient stated he tries to eat a balanced diet, it all depends on what his wife makes for meals. He stated he drinks about 2 quarts of water daily. Patient stated he does drink about one-two sodas daily and two cups of coffee in the morning. Patient stated he checks his blood pressure and it ranges around 116-120 or 65-70. He stated at this time he doesn't have any questions or concerns about his medication at this time.   Follow-Up:  Pharmacist Review   Tyrone Diaz, RMA Clinical Pharmacist Assistant [phone]  10 minutes spent in review, coordination, and documentation.  Reviewed by: Tyrone Diaz, PharmD Clinical Pharmacist [facility] [phone]

## 2020-11-18 DIAGNOSIS — H40021 Open angle with borderline findings, high risk, right eye: Secondary | ICD-10-CM | POA: Diagnosis not present

## 2020-11-18 DIAGNOSIS — H2513 Age-related nuclear cataract, bilateral: Secondary | ICD-10-CM | POA: Diagnosis not present

## 2020-11-18 DIAGNOSIS — H401121 Primary open-angle glaucoma, left eye, mild stage: Secondary | ICD-10-CM | POA: Diagnosis not present

## 2020-11-18 DIAGNOSIS — H25013 Cortical age-related cataract, bilateral: Secondary | ICD-10-CM | POA: Diagnosis not present

## 2020-12-01 DIAGNOSIS — G894 Chronic pain syndrome: Secondary | ICD-10-CM | POA: Diagnosis not present

## 2020-12-01 DIAGNOSIS — M62830 Muscle spasm of back: Secondary | ICD-10-CM | POA: Diagnosis not present

## 2020-12-01 DIAGNOSIS — G4700 Insomnia, unspecified: Secondary | ICD-10-CM | POA: Diagnosis not present

## 2020-12-01 DIAGNOSIS — M4807 Spinal stenosis, lumbosacral region: Secondary | ICD-10-CM | POA: Diagnosis not present

## 2020-12-29 DIAGNOSIS — G894 Chronic pain syndrome: Secondary | ICD-10-CM | POA: Diagnosis not present

## 2020-12-29 DIAGNOSIS — G4700 Insomnia, unspecified: Secondary | ICD-10-CM | POA: Diagnosis not present

## 2020-12-29 DIAGNOSIS — M62830 Muscle spasm of back: Secondary | ICD-10-CM | POA: Diagnosis not present

## 2020-12-29 DIAGNOSIS — M4807 Spinal stenosis, lumbosacral region: Secondary | ICD-10-CM | POA: Diagnosis not present

## 2021-01-14 ENCOUNTER — Ambulatory Visit: Payer: Medicare Other | Admitting: Internal Medicine

## 2021-01-17 ENCOUNTER — Ambulatory Visit: Payer: Medicare Other | Admitting: Internal Medicine

## 2021-01-26 DIAGNOSIS — M4807 Spinal stenosis, lumbosacral region: Secondary | ICD-10-CM | POA: Diagnosis not present

## 2021-01-26 DIAGNOSIS — G894 Chronic pain syndrome: Secondary | ICD-10-CM | POA: Diagnosis not present

## 2021-01-26 DIAGNOSIS — M62830 Muscle spasm of back: Secondary | ICD-10-CM | POA: Diagnosis not present

## 2021-01-26 DIAGNOSIS — G4700 Insomnia, unspecified: Secondary | ICD-10-CM | POA: Diagnosis not present

## 2021-02-09 ENCOUNTER — Telehealth: Payer: Medicare Other

## 2021-02-22 ENCOUNTER — Ambulatory Visit (INDEPENDENT_AMBULATORY_CARE_PROVIDER_SITE_OTHER): Payer: Medicare Other | Admitting: Internal Medicine

## 2021-02-22 ENCOUNTER — Other Ambulatory Visit: Payer: Self-pay

## 2021-02-22 ENCOUNTER — Encounter: Payer: Self-pay | Admitting: Internal Medicine

## 2021-02-22 VITALS — BP 126/62 | HR 61 | Temp 98.1°F | Resp 18 | Ht 71.0 in | Wt 207.4 lb

## 2021-02-22 DIAGNOSIS — E291 Testicular hypofunction: Secondary | ICD-10-CM

## 2021-02-22 DIAGNOSIS — N529 Male erectile dysfunction, unspecified: Secondary | ICD-10-CM

## 2021-02-22 DIAGNOSIS — E049 Nontoxic goiter, unspecified: Secondary | ICD-10-CM

## 2021-02-22 DIAGNOSIS — Z125 Encounter for screening for malignant neoplasm of prostate: Secondary | ICD-10-CM

## 2021-02-22 DIAGNOSIS — R739 Hyperglycemia, unspecified: Secondary | ICD-10-CM | POA: Diagnosis not present

## 2021-02-22 DIAGNOSIS — N400 Enlarged prostate without lower urinary tract symptoms: Secondary | ICD-10-CM

## 2021-02-22 NOTE — Progress Notes (Signed)
Subjective:    Patient ID: Tyrone Diaz, male    DOB: 16-Jul-1941, 80 y.o.   MRN: [id number]  DOS:  02/22/2021 Type of visit - description: Routine follow-up  Today we discussed several issues They moved to a new house, he has been working on doing repairs, he felt quite fatigued for 3 to 4 weeks but that is getting better. Denies fever chills.  No weight loss.  No headaches.  ED continue to be an issue, quality of erection is not very good, even when he takes sildenafil. Libido is normal. Alternative treatments?  Check testosterone?. Denies anxiety, depression.  Libido is normal.  Interested in prostrate cancer screening He denies dysuria, gross hematuria or difficulty urinating.  Review of Systems See above   Past Medical History:  Diagnosis Date   ANEMIA    ANXIETY    ARTHRITIS, CERVICAL SPINE    BCC (basal cell carcinoma of skin)    Cataracts, bilateral    GERD    GLAUCOMA, BILATERAL    History of stress test 12/2001   Showed normal perfusion with mild apical thinning.   History of stress test 11/2007   Post stress myocardial perfusion images show a normal pattern of perfusion in the all regions, the post stress left ventricular is normal in size. There is no scintigraphic evidence of inducible myocardial ischemia. The post EF 64%   Hx of echocardiogram 12/2001   Had previously shown mild MR as well as mild TR with normal systolic function.   Hx of echocardiogram 11/2006   5-years follow-up, this showed normal systolic as well as diastolic function. There was suggestion of flat mitral valve leaflet coaptation with borderline anterior mitral valve leaflet prolapse with mild associated mitral valve prolapse. He had mild TR. There was very mild aortic valve sclerosis without stenosis or insufficiency.   HYPERLIPIDEMIA    HYPOTHYROIDISM    PROCTITIS    TESTICULAR HYPOFUNCTION    Unspecified vitamin D deficiency    VARICOSE VEINS, LOWER EXTREMITIES     Past Surgical  History:  Procedure Laterality Date   DENTAL SURGERY     ear injury     piece of ear plug dislodged from ear   TONSILECTOMY, ADENOIDECTOMY, BILATERAL MYRINGOTOMY AND TUBES  1943   WISDOM TOOTH EXTRACTION      Allergies as of 02/22/2021       Reactions   Peanut-containing Drug Products    Gastrointestinal symptoms   Wasp Venom Hives, Itching        Medication List        Accurate as of February 22, 2021 10:42 AM. If you have any questions, ask your nurse or doctor.          cholecalciferol 1000 units tablet Commonly known as: VITAMIN D Take 1,000 Units by mouth daily.   diazepam 5 MG tablet Commonly known as: VALIUM Take 1 tablet (5 mg total) by mouth daily as needed for anxiety.   fluticasone 50 MCG/ACT nasal spray Commonly known as: FLONASE Place 1 spray into both nostrils daily.   HYDROcodone-acetaminophen 5-325 MG tablet Commonly known as: NORCO/VICODIN Take 1 tablet by mouth 2 (two) times daily. Per Pain Management Dr. Greta Doom   latanoprost 0.005 % ophthalmic solution Commonly known as: XALATAN Place 1 drop into both eyes at bedtime.   sildenafil 20 MG tablet Commonly known as: REVATIO Take 3-4 tablets (60-80 mg total) by mouth daily as needed.   timolol 0.5 % ophthalmic solution Commonly known as: TIMOPTIC Place  1 drop into both eyes daily. AM           Objective:   Physical Exam BP 126/62 (BP Location: Left Arm, Patient Position: Sitting, Cuff Size: Normal)   Pulse 61   Temp 98.1 F (36.7 C) (Oral)   Resp 18   Ht 5\' 11"  (1.803 m)   Wt 207 lb 6 oz (94.1 kg)   SpO2 98%   BMI 28.92 kg/m  General:   Well developed, NAD, BMI noted. HEENT:  Normocephalic . Face symmetric, atraumatic Lungs:  CTA B Normal respiratory effort, no intercostal retractions, no accessory muscle use. Heart: RRR,  no murmur.  Lower extremities: no pretibial edema bilaterally DRE: Normal sphincter tone, no stools, prostate slt enlarged? No tender or nodular Skin: Not  pale. Not jaundice Neurologic:  alert & oriented X3.  Speech normal, gait appropriate for age and unassisted Psych--  Cognition and judgment appear intact.  Cooperative with normal attention span and concentration.  Behavior appropriate. No anxious or depressed appearing.      Assessment     ASSESSMENT Prediabetes Dyslipidemia Anxiety - on valium rx by pcp, get UDS @ pain mngmt  GI: GERD, IBS, several EGDs before  Allergies : Peanuts, pollen, sunlight, trees,(h/o anal pruritus stopped after peanuts dc from diet) Hypogonadism -- free T wnl 2016  ED, decreased libido MSK: spinal stenosis, Dr Greta Doom.  Microdiscectomy 10-2018 Glaucoma Varicose veins H/o goiter, remote bx H/o low vit d H/p palpitations, normal echo 2008, (-) stress test 2009 H/o Chest pain: Myoview  04/27/2020 neg.    PLAN: Prediabetes: Doing well with lifestyle, check CMP, A1c, TSH. Dyslipidemia: Controlled on lifestyle per last [facility] ED: Continue to be an issue, he achieves mild erections with sildenafil but would like to improve.  Offered urology referral, he will let me know. Also interested on checking his testosterone, he is warned about caveats of results interpretation.  Will RTC early in the morning to be checked. Glaucoma: Eye doctor discontinued drops and they will watch. Preventive care: Likes to restart prostate cancer screening, no symptoms, DRE slt enlarget prostate? check a PSA r/o bph Discussed COVID-vaccine &  PNM 20 benefits, declined . RTC for labs in few days RTC routine checkup 6 months  This visit occurred during the SARS-CoV-2 public health emergency.  Safety protocols were in place, including screening questions prior to the visit, additional usage of staff PPE, and extensive cleaning of exam room while observing appropriate contact time as indicated for disinfecting solutions.

## 2021-02-22 NOTE — Patient Instructions (Signed)
    GO TO THE FRONT DESK, PLEASE SCHEDULE YOUR APPOINTMENTS  Come back for labs at your convenience.  The labs need to be drawn very rarely, at 8 AM  Come back for a checkup in 6 months

## 2021-02-22 NOTE — Assessment & Plan Note (Signed)
Prediabetes: Doing well with lifestyle, check CMP, A1c, TSH. Dyslipidemia: Controlled on lifestyle per last [facility] ED: Continue to be an issue, he achieves mild erections with sildenafil but would like to improve.  Offered urology referral, he will let me know. Also interested on checking his testosterone, he is warned about caveats of results interpretation.  Will RTC early in the morning to be checked. Glaucoma: Eye doctor discontinued drops and they will watch. Preventive care: Likes to restart prostate cancer screening, no symptoms, DRE slt enlarget prostate? check a PSA r/o bph Discussed COVID-vaccine &  PNM 20 benefits, declined . RTC for labs in few days RTC routine checkup 6 months

## 2021-02-23 ENCOUNTER — Other Ambulatory Visit (INDEPENDENT_AMBULATORY_CARE_PROVIDER_SITE_OTHER): Payer: Medicare Other

## 2021-02-23 DIAGNOSIS — N400 Enlarged prostate without lower urinary tract symptoms: Secondary | ICD-10-CM

## 2021-02-23 DIAGNOSIS — E291 Testicular hypofunction: Secondary | ICD-10-CM | POA: Diagnosis not present

## 2021-02-23 DIAGNOSIS — Z125 Encounter for screening for malignant neoplasm of prostate: Secondary | ICD-10-CM

## 2021-02-23 DIAGNOSIS — E049 Nontoxic goiter, unspecified: Secondary | ICD-10-CM | POA: Diagnosis not present

## 2021-02-23 DIAGNOSIS — R739 Hyperglycemia, unspecified: Secondary | ICD-10-CM

## 2021-02-23 LAB — COMPREHENSIVE METABOLIC PANEL
ALT: 15 U/L (ref 0–53)
AST: 14 U/L (ref 0–37)
Albumin: 4.4 g/dL (ref 3.5–5.2)
Alkaline Phosphatase: 44 U/L (ref 39–117)
BUN: 16 mg/dL (ref 6–23)
CO2: 29 mEq/L (ref 19–32)
Calcium: 9.4 mg/dL (ref 8.4–10.5)
Chloride: 104 mEq/L (ref 96–112)
Creatinine, Ser: 0.92 mg/dL (ref 0.40–1.50)
GFR: 78.96 mL/min (ref >60.00)
Glucose, Bld: 100 mg/dL — ABNORMAL HIGH (ref 70–99)
Potassium: 4.6 mEq/L (ref 3.5–5.1)
Sodium: 141 mEq/L (ref 135–145)
Total Bilirubin: 1 mg/dL (ref 0.2–1.2)
Total Protein: 6.8 g/dL (ref 6.0–8.3)

## 2021-02-23 LAB — TSH: TSH: 0.58 u[IU]/mL (ref 0.35–4.50)

## 2021-02-23 LAB — TESTOSTERONE: Testosterone: 298.35 ng/dL — ABNORMAL LOW (ref 300.00–890.00)

## 2021-02-23 LAB — HEMOGLOBIN A1C: Hgb A1c MFr Bld: 6 % (ref 4.6–6.5)

## 2021-02-23 LAB — PSA: PSA: 0.79 ng/mL (ref 0.10–4.00)

## 2021-02-25 ENCOUNTER — Telehealth: Payer: Self-pay | Admitting: Internal Medicine

## 2021-02-28 DIAGNOSIS — G894 Chronic pain syndrome: Secondary | ICD-10-CM | POA: Diagnosis not present

## 2021-02-28 DIAGNOSIS — M4807 Spinal stenosis, lumbosacral region: Secondary | ICD-10-CM | POA: Diagnosis not present

## 2021-02-28 DIAGNOSIS — M62830 Muscle spasm of back: Secondary | ICD-10-CM | POA: Diagnosis not present

## 2021-02-28 DIAGNOSIS — G4700 Insomnia, unspecified: Secondary | ICD-10-CM | POA: Diagnosis not present

## 2021-02-28 DIAGNOSIS — Z79891 Long term (current) use of opiate analgesic: Secondary | ICD-10-CM | POA: Diagnosis not present

## 2021-03-05 NOTE — Telephone Encounter (Signed)
error 

## 2021-03-21 DIAGNOSIS — L821 Other seborrheic keratosis: Secondary | ICD-10-CM | POA: Diagnosis not present

## 2021-03-21 DIAGNOSIS — D1801 Hemangioma of skin and subcutaneous tissue: Secondary | ICD-10-CM | POA: Diagnosis not present

## 2021-03-21 DIAGNOSIS — L720 Epidermal cyst: Secondary | ICD-10-CM | POA: Diagnosis not present

## 2021-03-21 DIAGNOSIS — L570 Actinic keratosis: Secondary | ICD-10-CM | POA: Diagnosis not present

## 2021-03-21 DIAGNOSIS — D235 Other benign neoplasm of skin of trunk: Secondary | ICD-10-CM | POA: Diagnosis not present

## 2021-03-21 DIAGNOSIS — D225 Melanocytic nevi of trunk: Secondary | ICD-10-CM | POA: Diagnosis not present

## 2021-03-23 ENCOUNTER — Telehealth: Payer: Medicare Other

## 2021-03-29 DIAGNOSIS — G4700 Insomnia, unspecified: Secondary | ICD-10-CM | POA: Diagnosis not present

## 2021-03-29 DIAGNOSIS — M4807 Spinal stenosis, lumbosacral region: Secondary | ICD-10-CM | POA: Diagnosis not present

## 2021-03-29 DIAGNOSIS — G894 Chronic pain syndrome: Secondary | ICD-10-CM | POA: Diagnosis not present

## 2021-03-29 DIAGNOSIS — M62830 Muscle spasm of back: Secondary | ICD-10-CM | POA: Diagnosis not present

## 2021-04-26 DIAGNOSIS — G4700 Insomnia, unspecified: Secondary | ICD-10-CM | POA: Diagnosis not present

## 2021-04-26 DIAGNOSIS — G894 Chronic pain syndrome: Secondary | ICD-10-CM | POA: Diagnosis not present

## 2021-04-26 DIAGNOSIS — M62830 Muscle spasm of back: Secondary | ICD-10-CM | POA: Diagnosis not present

## 2021-04-26 DIAGNOSIS — M4807 Spinal stenosis, lumbosacral region: Secondary | ICD-10-CM | POA: Diagnosis not present

## 2021-05-24 ENCOUNTER — Telehealth: Payer: Self-pay | Admitting: Internal Medicine

## 2021-05-24 NOTE — Telephone Encounter (Signed)
Left message for patient to call back and schedule Medicare Annual Wellness Visit (AWV) in office.   If not able to come in office, please offer to do virtually or by telephone.  Left office number and my jabber [phone].  Last AWV:10/19/2017  Please schedule at anytime with Nurse Health Advisor.

## 2021-05-27 DIAGNOSIS — G4700 Insomnia, unspecified: Secondary | ICD-10-CM | POA: Diagnosis not present

## 2021-05-27 DIAGNOSIS — M4807 Spinal stenosis, lumbosacral region: Secondary | ICD-10-CM | POA: Diagnosis not present

## 2021-05-27 DIAGNOSIS — G894 Chronic pain syndrome: Secondary | ICD-10-CM | POA: Diagnosis not present

## 2021-05-27 DIAGNOSIS — M62830 Muscle spasm of back: Secondary | ICD-10-CM | POA: Diagnosis not present

## 2021-06-27 ENCOUNTER — Other Ambulatory Visit: Payer: Self-pay

## 2021-06-27 ENCOUNTER — Ambulatory Visit (INDEPENDENT_AMBULATORY_CARE_PROVIDER_SITE_OTHER): Payer: Medicare Other | Admitting: Internal Medicine

## 2021-06-27 ENCOUNTER — Encounter: Payer: Self-pay | Admitting: Internal Medicine

## 2021-06-27 VITALS — BP 138/68 | HR 71 | Temp 98.1°F | Resp 16 | Ht 71.0 in | Wt 210.4 lb

## 2021-06-27 DIAGNOSIS — M5416 Radiculopathy, lumbar region: Secondary | ICD-10-CM | POA: Diagnosis not present

## 2021-06-27 DIAGNOSIS — G894 Chronic pain syndrome: Secondary | ICD-10-CM | POA: Diagnosis not present

## 2021-06-27 DIAGNOSIS — G4700 Insomnia, unspecified: Secondary | ICD-10-CM | POA: Diagnosis not present

## 2021-06-27 DIAGNOSIS — M62830 Muscle spasm of back: Secondary | ICD-10-CM | POA: Diagnosis not present

## 2021-06-27 DIAGNOSIS — M4807 Spinal stenosis, lumbosacral region: Secondary | ICD-10-CM | POA: Diagnosis not present

## 2021-06-27 MED ORDER — SILDENAFIL CITRATE 20 MG PO TABS: 60.0000 mg | ORAL_TABLET | Freq: Every day | ORAL | 6 refills | Status: DC | PRN

## 2021-06-27 NOTE — Patient Instructions (Signed)
Recommend to proceed with the following vaccines at your pharmacy:  Shingrix (shingles) Covid booster Flu shot

## 2021-06-27 NOTE — Progress Notes (Signed)
Subjective:    Patient ID: Tyrone Diaz, male    DOB: May 20, 1941, 80 y.o.   MRN: [id number]  DOS:  06/27/2021 Type of visit - description: Acute Symptoms started a month ago. He feels pain at the posterior side of his R thigh. Is worse with driving, it decreases if he sits in certain positions. Pain is described as a spasm. Saw orthopedics this morning and he was prescribed MRI. He likes to know my opinion.  He denies fever chills. No fall or injury No claudication No lower extremity paresthesias No difficulty controlling his bladder or bowel. He has some pain in the buttock but no particular pain @ the low back  Review of Systems See above   Past Medical History:  Diagnosis Date   ANEMIA    ANXIETY    ARTHRITIS, CERVICAL SPINE    BCC (basal cell carcinoma of skin)    Cataracts, bilateral    GERD    GLAUCOMA, BILATERAL    History of stress test 12/2001   Showed normal perfusion with mild apical thinning.   History of stress test 11/2007   Post stress myocardial perfusion images show a normal pattern of perfusion in the all regions, the post stress left ventricular is normal in size. There is no scintigraphic evidence of inducible myocardial ischemia. The post EF 64%   Hx of echocardiogram 12/2001   Had previously shown mild MR as well as mild TR with normal systolic function.   Hx of echocardiogram 11/2006   5-years follow-up, this showed normal systolic as well as diastolic function. There was suggestion of flat mitral valve leaflet coaptation with borderline anterior mitral valve leaflet prolapse with mild associated mitral valve prolapse. He had mild TR. There was very mild aortic valve sclerosis without stenosis or insufficiency.   HYPERLIPIDEMIA    HYPOTHYROIDISM    PROCTITIS    TESTICULAR HYPOFUNCTION    Unspecified vitamin D deficiency    VARICOSE VEINS, LOWER EXTREMITIES     Past Surgical History:  Procedure Laterality Date   BACK SURGERY  2019   per  pt, low back surgery   DENTAL SURGERY     ear injury     piece of ear plug dislodged from ear   TONSILECTOMY, ADENOIDECTOMY, BILATERAL MYRINGOTOMY AND TUBES  09/11/1941   WISDOM TOOTH EXTRACTION      Allergies as of 06/27/2021       Reactions   Peanut-containing Drug Products    Gastrointestinal symptoms   Wasp Venom Hives, Itching        Medication List        Accurate as of June 27, 2021 11:59 PM. If you have any questions, ask your nurse or doctor.          cholecalciferol 1000 units tablet Commonly known as: VITAMIN D Take 1,000 Units by mouth daily.   diazepam 5 MG tablet Commonly known as: VALIUM Take 1 tablet (5 mg total) by mouth daily as needed for anxiety.   fluticasone 50 MCG/ACT nasal spray Commonly known as: FLONASE Place 1 spray into both nostrils daily.   HYDROcodone-acetaminophen 5-325 MG tablet Commonly known as: NORCO/VICODIN Take 1 tablet by mouth 2 (two) times daily. Per Pain Management Dr. Greta Doom   sildenafil 20 MG tablet Commonly known as: REVATIO Take 3-4 tablets (60-80 mg total) by mouth daily as needed.           Objective:   Physical Exam BP 138/68 (BP Location: Left Arm, Patient Position: Sitting,  Cuff Size: Small)   Pulse 71   Temp 98.1 F (36.7 C) (Oral)   Resp 16   Ht 5\' 11"  (1.803 m)   Wt 210 lb 6 oz (95.4 kg)   SpO2 97%   BMI 29.34 kg/m  General:   Well developed, NAD, BMI noted. HEENT:  Normocephalic . Face symmetric, atraumatic MSK: No TTP at the thoracic and lumbar spine Lower extremities: no pretibial edema bilaterally.  Calves symmetric, nontender Skin: Not pale. Not jaundice Neurologic:  alert & oriented X3.  Speech normal, gait appropriate for age and unassisted.  Posture is normal , not antalgic. Motor symmetric Straight leg test negative DTRs: Decreased at the R knee, otherwise symmetric. Psych--  Cognition and judgment appear intact.  Cooperative with normal attention span and concentration.   Behavior appropriate. No anxious or depressed appearing.      Assessment       ASSESSMENT Prediabetes Dyslipidemia Anxiety - on valium rx by pcp, get UDS @ pain mngmt  GI: GERD, IBS, several EGDs before  Allergies : Peanuts, pollen, sunlight, trees,(h/o anal pruritus stopped after peanuts dc from diet) Hypogonadism -- free T wnl 2016  ED, decreased libido MSK: spinal stenosis, Dr Greta Doom.  Microdiscectomy 10-2018 Glaucoma Varicose veins H/o goiter, remote bx H/o low vit d H/p palpitations, normal echo 2008, (-) stress test 2009 H/o Chest pain: Myoview  04/27/2020 neg.    PLAN: Radiculopathy R leg pain as described above, no claudication, no edema, he saw orthopedic doctor this morning and he was recommended an MRI. Patient like in my opinion, he is concerned about a DVT. DVT unlikely  on clinical grounds, likely has a radiculopathy, agree with MRI. Pain management: He states he only needs Tylenol. Consider a round of prednisone Preventive care: Declined a flu shot today   This visit occurred during the SARS-CoV-2 public health emergency.  Safety protocols were in place, including screening questions prior to the visit, additional usage of staff PPE, and extensive cleaning of exam room while observing appropriate contact time as indicated for disinfecting solutions.

## 2021-06-28 NOTE — Assessment & Plan Note (Signed)
Radiculopathy R leg pain as described above, no claudication, no edema, he saw orthopedic doctor this morning and he was recommended an MRI. Patient like in my opinion, he is concerned about a DVT. DVT unlikely  on clinical grounds, likely has a radiculopathy, agree with MRI. Pain management: He states he only needs Tylenol. Consider a round of prednisone Preventive care: Declined a flu shot today

## 2021-07-08 DIAGNOSIS — M4807 Spinal stenosis, lumbosacral region: Secondary | ICD-10-CM | POA: Diagnosis not present

## 2021-07-08 DIAGNOSIS — M47816 Spondylosis without myelopathy or radiculopathy, lumbar region: Secondary | ICD-10-CM | POA: Diagnosis not present

## 2021-07-08 DIAGNOSIS — M4726 Other spondylosis with radiculopathy, lumbar region: Secondary | ICD-10-CM | POA: Diagnosis not present

## 2021-07-08 DIAGNOSIS — M48061 Spinal stenosis, lumbar region without neurogenic claudication: Secondary | ICD-10-CM | POA: Diagnosis not present

## 2021-07-25 DIAGNOSIS — G894 Chronic pain syndrome: Secondary | ICD-10-CM | POA: Diagnosis not present

## 2021-07-25 DIAGNOSIS — G4700 Insomnia, unspecified: Secondary | ICD-10-CM | POA: Diagnosis not present

## 2021-07-25 DIAGNOSIS — M62830 Muscle spasm of back: Secondary | ICD-10-CM | POA: Diagnosis not present

## 2021-07-25 DIAGNOSIS — M4807 Spinal stenosis, lumbosacral region: Secondary | ICD-10-CM | POA: Diagnosis not present

## 2021-08-08 ENCOUNTER — Telehealth: Payer: Self-pay | Admitting: Internal Medicine

## 2021-08-08 NOTE — Telephone Encounter (Signed)
Left message for patient to call back and schedule Medicare Annual Wellness Visit (AWV) in office.   If not able to come in office, please offer to do virtually or by telephone.  Left office number and my jabber [phone].  Last AWV:10/19/2017  Please schedule at anytime with Nurse Health Advisor.

## 2021-08-17 DIAGNOSIS — M48062 Spinal stenosis, lumbar region with neurogenic claudication: Secondary | ICD-10-CM | POA: Diagnosis not present

## 2021-08-17 DIAGNOSIS — M48061 Spinal stenosis, lumbar region without neurogenic claudication: Secondary | ICD-10-CM | POA: Diagnosis not present

## 2021-08-17 DIAGNOSIS — M545 Low back pain, unspecified: Secondary | ICD-10-CM | POA: Diagnosis not present

## 2021-08-22 DIAGNOSIS — M62830 Muscle spasm of back: Secondary | ICD-10-CM | POA: Diagnosis not present

## 2021-08-22 DIAGNOSIS — G4700 Insomnia, unspecified: Secondary | ICD-10-CM | POA: Diagnosis not present

## 2021-08-22 DIAGNOSIS — G894 Chronic pain syndrome: Secondary | ICD-10-CM | POA: Diagnosis not present

## 2021-08-22 DIAGNOSIS — M4807 Spinal stenosis, lumbosacral region: Secondary | ICD-10-CM | POA: Diagnosis not present

## 2021-08-24 ENCOUNTER — Ambulatory Visit: Payer: Medicare Other | Admitting: Internal Medicine

## 2021-09-21 ENCOUNTER — Other Ambulatory Visit: Payer: Self-pay

## 2021-09-21 ENCOUNTER — Encounter: Payer: Self-pay | Admitting: Physical Therapy

## 2021-09-21 ENCOUNTER — Ambulatory Visit: Payer: Medicare Other | Attending: Orthopaedic Surgery | Admitting: Physical Therapy

## 2021-09-21 DIAGNOSIS — M5441 Lumbago with sciatica, right side: Secondary | ICD-10-CM | POA: Insufficient documentation

## 2021-09-21 DIAGNOSIS — M62838 Other muscle spasm: Secondary | ICD-10-CM | POA: Insufficient documentation

## 2021-09-21 NOTE — Patient Instructions (Signed)
Access Code: [email or web address] URL: [email or web address] Date: 09/21/2021 Prepared by: Glenetta Hew  Exercises Seated Hamstring Stretch - 2 x daily - 7 x weekly - 1 sets - 3 reps - 30-45 sec hold

## 2021-09-22 NOTE — Therapy (Signed)
[facility] [address]  [place], [place], [address] Phone: [phone]   Fax:  [phone]  Physical Therapy Evaluation  Patient Details  Name: Tyrone Diaz MRN: [id number] Date of Birth: 10-08-1940 No data recorded  Encounter Date: 09/21/2021   PT End of Session - 09/21/21 1624     Visit Number 1    Number of Visits 12    Date for PT Re-Evaluation 11/03/21    Authorization Type Medicare + BCBS supplement    Progress Note Due on Visit 10    PT Start Time 1616    PT Stop Time 1700    PT Time Calculation (min) 44 min    Activity Tolerance Patient tolerated treatment well    Behavior During Therapy [facility] for tasks assessed/performed             Past Medical History:  Diagnosis Date   ANEMIA    ANXIETY    ARTHRITIS, CERVICAL SPINE    BCC (basal cell carcinoma of skin)    Cataracts, bilateral    GERD    GLAUCOMA, BILATERAL    History of stress test 12/2001   Showed normal perfusion with mild apical thinning.   History of stress test 11/2007   Post stress myocardial perfusion images show a normal pattern of perfusion in the all regions, the post stress left ventricular is normal in size. There is no scintigraphic evidence of inducible myocardial ischemia. The post EF 64%   Hx of echocardiogram 12/2001   Had previously shown mild MR as well as mild TR with normal systolic function.   Hx of echocardiogram 11/2006   5-years follow-up, this showed normal systolic as well as diastolic function. There was suggestion of flat mitral valve leaflet coaptation with borderline anterior mitral valve leaflet prolapse with mild associated mitral valve prolapse. He had mild TR. There was very mild aortic valve sclerosis without stenosis or insufficiency.   HYPERLIPIDEMIA    HYPOTHYROIDISM    PROCTITIS    TESTICULAR HYPOFUNCTION    Unspecified vitamin D deficiency    VARICOSE VEINS, LOWER EXTREMITIES     Past  Surgical History:  Procedure Laterality Date   BACK SURGERY  2019   per pt, low back surgery   DENTAL SURGERY     ear injury     piece of ear plug dislodged from ear   TONSILECTOMY, ADENOIDECTOMY, BILATERAL MYRINGOTOMY AND TUBES  09/11/1941   WISDOM TOOTH EXTRACTION      There were no vitals filed for this visit.    Subjective Assessment - 09/21/21 1628     Subjective Patient reports increased pain with prolonged sitting, especially driving, in R leg radiating to buttock and knee.  This started a few months ago.  He reports the pain is getting better though.    Pertinent History spinal surgery L1-L5 laminectomy, varicose vein stripping.    Limitations Sitting    How long can you sit comfortably? 30-45 min    Diagnostic tests lumbar xray L1-2, L2-3, L3-4 and L4-5 with DDD and disc narrowing and anterior spurs,   no anterolisthesis, the neuroforamen are narrowed at L3-4 and L4-5 on   lateral radiographs.    Patient Stated Goals "alleivate the leg pain with driving"    Currently in Pain? Yes    Pain Score 0-No pain   8-9/10 with prolonged driving   Pain Location Leg    Pain Orientation Right    Pain Descriptors /  Indicators Cramping;Spasm;Aching    Pain Type Chronic pain    Pain Radiating Towards R buttock to knee    Pain Onset More than a month ago   3 months ago   Pain Frequency Occasional    Aggravating Factors  sitting long periods    Pain Relieving Factors moving    Effect of Pain on Daily Activities makes driving distances unsafe                            Objective measurements completed on examination: See above findings.       [facility] Adult PT Treatment/Exercise - 09/22/21 0001       Self-Care   Self-Care Other Self-Care Comments    Other Self-Care Comments  see patient education                     PT Education - 09/22/21 [phone]     Education Details Educated on findings, POC including dry needling, and initial HEP for hamstring  stretch. Access Code: [email or web address]    Person(s) Educated Patient    Methods Explanation;Demonstration;Verbal cues;Handout    Comprehension Verbalized understanding;Returned demonstration              PT Short Term Goals - 09/22/21 0841       PT SHORT TERM GOAL #1   Title Pt. will be independent with initial HEP for hip strengthening/ROM.    Time 2    Period Weeks    Status New    Target Date 10/06/21               PT Long Term Goals - 09/22/21 0842       PT LONG TERM GOAL #1   Title Independent with ongoing/advanced HEP    Time 6    Period Weeks    Status New    Target Date 11/03/21      PT LONG TERM GOAL #2   Title Pt. will be able to drive for 1.5 hours without increased RLE pain to travel.    Baseline 30-45 min before severe pain    Time 6    Period Weeks    Status New    Target Date 11/03/21                    Plan - 09/22/21 0834     Clinical Impression Statement [name] is an 81 year old male referred for lumbar stenosis.  He reports R sided leg pain only when sitting prolonged periods.   He had some pain when loaded into extension and rotation in R side low back, and tenderness in R glut med and R hamstring, and tightness in R hamstring, having difficulty fully extending R knee in sitting, although Lhermitte's sign was negative.  Discussed likely cause is irritation of sciatic nerve and plan of care to address tightness in hips that may be contributing.  He would benefit from skilled physical therapy to decrease pain and improve ability to drive safely and travel.    Personal Factors and Comorbidities Comorbidity 2    Comorbidities history of LBP and back surgery, history varicose veing stripping in R leg, spinal stenosis    Examination-Activity Limitations Locomotion Level;Sit    Examination-Participation Restrictions Driving    Stability/Clinical Decision Making Stable/Uncomplicated    Clinical Decision Making Low    Rehab Potential  Excellent    PT Frequency 2x / week  PT Duration 6 weeks    PT Treatment/Interventions ADLs/Self Care Home Management;Cryotherapy;Electrical Stimulation;Iontophoresis 4mg /ml Dexamethasone;Moist Heat;Traction;Ultrasound;Functional mobility training;Therapeutic activities;Therapeutic exercise;Neuromuscular re-education;Patient/family education;Dry needling;Manual techniques;Taping;Spinal Manipulations;Joint Manipulations;Passive range of motion    PT Next Visit Plan start hip/core strengthening, manual therapy consider dry needling R glut med/R HS, modalities PRN    PT Home Exercise Plan Access Code: [email or web address]    Consulted and Agree with Plan of Care Patient             Patient will benefit from skilled therapeutic intervention in order to improve the following deficits and impairments:  Decreased range of motion, Increased muscle spasms, Impaired flexibility, Pain, Hypomobility, Increased fascial restricitons, Decreased strength, Decreased activity tolerance  Visit Diagnosis: Acute right-sided low back pain with right-sided sciatica  Other muscle spasm     Problem List Patient Active Problem List   Diagnosis Date Noted   BCC (basal cell carcinoma of skin) 12/01/2019   History of lumbar laminectomy 10/28/2018   PCP NOTES >>>>>>>>>>>>>>>>> 10/17/2016   Erectile dysfunction 04/13/2015   Hyperglycemia 10/13/2014   GERD (gastroesophageal reflux disease) 10/13/2014   Edema leg 07/02/2014   Palpitations 07/02/2014   Annual physical exam 08/05/2012   h/o goiter, remote Bx 08/05/2012   Allergic rhinitis 12/28/2011   Hypogonadism in male 04/28/2010   Vitamin D deficiency 04/28/2010   Dyslipidemia 04/28/2010   VARICOSE VEINS, LOWER EXTREMITIES 04/28/2010   IBS (irritable bowel syndrome) 04/28/2010   Anxiety state 07/23/2008   GLAUCOMA, BILATERAL 07/23/2008   PROCTITIS- anal pruritus 07/23/2008   Spinal stenosis 07/23/2008    Rennie Natter, PT, DPT  09/22/2021, 8:49  AM  [facility] [address]  [place], [place], [address] Phone: [phone]   Fax:  [phone]  Name: Tyrone Diaz MRN: [id number] Date of Birth: [birth year]

## 2021-09-26 ENCOUNTER — Ambulatory Visit: Payer: Medicare Other

## 2021-09-26 ENCOUNTER — Other Ambulatory Visit: Payer: Self-pay

## 2021-09-26 DIAGNOSIS — M62838 Other muscle spasm: Secondary | ICD-10-CM | POA: Diagnosis not present

## 2021-09-26 DIAGNOSIS — M5441 Lumbago with sciatica, right side: Secondary | ICD-10-CM | POA: Diagnosis not present

## 2021-09-26 NOTE — Patient Instructions (Signed)
Access Code: [email or web address] URL: [email or web address] Date: 09/26/2021 Prepared by: Clarene Essex  Exercises Seated Hamstring Stretch - 2 x daily - 7 x weekly - 1 sets - 3 reps - 30-45 sec hold Seated Piriformis Stretch with Trunk Bend - 2 x daily - 7 x weekly - 1 sets - 3 reps - 30-45 hold Supine Bridge with Mini Swiss Ball Between Knees - 1 x daily - 7 x weekly - 3 sets - 10 reps - 5 sec hold Clamshell - 1 x daily - 7 x weekly - 3 sets - 10 reps - 5 sec hold

## 2021-09-26 NOTE — Therapy (Signed)
[facility] [address]  [place], [place], [address] Phone: [phone]   Fax:  [phone]  Physical Therapy Treatment  Patient Details  Name: PONCE SKILLMAN MRN: [id number] Date of Birth: 07-Nov-1940 No data recorded  Encounter Date: 09/26/2021   PT End of Session - 09/26/21 1647     Visit Number 2    Number of Visits 12    Date for PT Re-Evaluation 11/03/21    Authorization Type Medicare + BCBS supplement    Progress Note Due on Visit 10    PT Start Time 1448    PT Stop Time 1530    PT Time Calculation (min) 42 min    Activity Tolerance Patient tolerated treatment well    Behavior During Therapy [facility] for tasks assessed/performed             Past Medical History:  Diagnosis Date   ANEMIA    ANXIETY    ARTHRITIS, CERVICAL SPINE    BCC (basal cell carcinoma of skin)    Cataracts, bilateral    GERD    GLAUCOMA, BILATERAL    History of stress test 12/2001   Showed normal perfusion with mild apical thinning.   History of stress test 11/2007   Post stress myocardial perfusion images show a normal pattern of perfusion in the all regions, the post stress left ventricular is normal in size. There is no scintigraphic evidence of inducible myocardial ischemia. The post EF 64%   Hx of echocardiogram 12/2001   Had previously shown mild MR as well as mild TR with normal systolic function.   Hx of echocardiogram 11/2006   5-years follow-up, this showed normal systolic as well as diastolic function. There was suggestion of flat mitral valve leaflet coaptation with borderline anterior mitral valve leaflet prolapse with mild associated mitral valve prolapse. He had mild TR. There was very mild aortic valve sclerosis without stenosis or insufficiency.   HYPERLIPIDEMIA    HYPOTHYROIDISM    PROCTITIS    TESTICULAR HYPOFUNCTION    Unspecified vitamin D deficiency    VARICOSE VEINS, LOWER EXTREMITIES     Past  Surgical History:  Procedure Laterality Date   BACK SURGERY  2019   per pt, low back surgery   DENTAL SURGERY     ear injury     piece of ear plug dislodged from ear   TONSILECTOMY, ADENOIDECTOMY, BILATERAL MYRINGOTOMY AND TUBES  09/11/1941   WISDOM TOOTH EXTRACTION      There were no vitals filed for this visit.   Subjective Assessment - 09/26/21 1450     Subjective Pt reports things are about the same, still mostly having pain when sitting.    Pertinent History spinal surgery L1-L5 laminectomy, varicose vein stripping.    Diagnostic tests lumbar xray L1-2, L2-3, L3-4 and L4-5 with DDD and disc narrowing and anterior spurs,   no anterolisthesis, the neuroforamen are narrowed at L3-4 and L4-5 on   lateral radiographs.    Patient Stated Goals "alleivate the leg pain with driving"    Currently in Pain? No/denies    Pain Score --   8-9/10 sitting                              [facility] Adult PT Treatment/Exercise - 09/26/21 0001       Exercises   Exercises Lumbar      Lumbar Exercises: Stretches  Active Hamstring Stretch Right;Left;30 seconds    Active Hamstring Stretch Limitations seated    Standing Extension 5 reps;5 seconds    Piriformis Stretch Left;Right;30 seconds;2 reps    Piriformis Stretch Limitations seated hinge      Lumbar Exercises: Aerobic   Recumbent Bike L2x48min      Lumbar Exercises: Supine   Bridge 10 reps;3 seconds    Bridge Limitations with ball squeeze      Lumbar Exercises: Sidelying   Clam Right;Left;10 reps;3 seconds      Manual Therapy   Manual Therapy Soft tissue mobilization;Myofascial release    Soft tissue mobilization STM to R lateral HS, piriformis    Myofascial Release TPR to R piriforms                     PT Education - 09/26/21 1649     Education Details HEP update and review    Person(s) Educated Patient    Methods Explanation;Demonstration    Comprehension Verbalized understanding;Returned  demonstration              PT Short Term Goals - 09/26/21 1649       PT SHORT TERM GOAL #1   Title Pt. will be independent with initial HEP for hip strengthening/ROM.    Time 2    Period Weeks    Status On-going    Target Date 10/06/21               PT Long Term Goals - 09/26/21 1649       PT LONG TERM GOAL #1   Title Independent with ongoing/advanced HEP    Time 6    Period Weeks    Status On-going    Target Date 11/03/21      PT LONG TERM GOAL #2   Title Pt. will be able to drive for 1.5 hours without increased RLE pain to travel.    Baseline 30-45 min before severe pain    Time 6    Period Weeks    Status On-going    Target Date 11/03/21                   Plan - 09/26/21 1648     Clinical Impression Statement Pt showed lots of tightness in the glutes today but after 1 rep of piriformis stretching showed better ROM. Provided education on hip anatomy, possibility of glute tightness contributing to sciatic pain and importance of stretches. Pt reported that sitting was the most bothersome factor as far as pain. He was tender along the R piriformis and lateral hamstring muscles, after manual showed better mobility. Pt responded well.    Personal Factors and Comorbidities Comorbidity 2    Comorbidities history of LBP and back surgery, history varicose veing stripping in R leg, spinal stenosis    PT Frequency 2x / week    PT Duration 6 weeks    PT Treatment/Interventions ADLs/Self Care Home Management;Cryotherapy;Electrical Stimulation;Iontophoresis 4mg /ml Dexamethasone;Moist Heat;Traction;Ultrasound;Functional mobility training;Therapeutic activities;Therapeutic exercise;Neuromuscular re-education;Patient/family education;Dry needling;Manual techniques;Taping;Spinal Manipulations;Joint Manipulations;Passive range of motion    PT Next Visit Plan start hip/core strengthening, manual therapy consider dry needling R glut med/R HS, modalities PRN    PT Home  Exercise Plan Access Code: [email or web address]    Consulted and Agree with Plan of Care Patient             Patient will benefit from skilled therapeutic intervention in order to improve the following deficits and impairments:  Decreased range of  motion, Increased muscle spasms, Impaired flexibility, Pain, Hypomobility, Increased fascial restricitons, Decreased strength, Decreased activity tolerance  Visit Diagnosis: Acute right-sided low back pain with right-sided sciatica  Other muscle spasm     Problem List Patient Active Problem List   Diagnosis Date Noted   BCC (basal cell carcinoma of skin) 12/01/2019   History of lumbar laminectomy 10/28/2018   PCP NOTES >>>>>>>>>>>>>>>>> 10/17/2016   Erectile dysfunction 04/13/2015   Hyperglycemia 10/13/2014   GERD (gastroesophageal reflux disease) 10/13/2014   Edema leg 07/02/2014   Palpitations 07/02/2014   Annual physical exam 08/05/2012   h/o goiter, remote Bx 08/05/2012   Allergic rhinitis 12/28/2011   Hypogonadism in male 04/28/2010   Vitamin D deficiency 04/28/2010   Dyslipidemia 04/28/2010   VARICOSE VEINS, LOWER EXTREMITIES 04/28/2010   IBS (irritable bowel syndrome) 04/28/2010   Anxiety state 07/23/2008   GLAUCOMA, BILATERAL 07/23/2008   PROCTITIS- anal pruritus 07/23/2008   Spinal stenosis 07/23/2008    Artist Pais, PTA 09/26/2021, 4:50 PM  [facility] [address]  [place], [place], [address] Phone: [phone]   Fax:  [phone]  Name: ROYSTON BEKELE MRN: [id number] Date of Birth: 1941/07/07

## 2021-09-29 ENCOUNTER — Ambulatory Visit: Payer: Medicare Other

## 2021-09-29 ENCOUNTER — Other Ambulatory Visit: Payer: Self-pay

## 2021-09-29 DIAGNOSIS — M5441 Lumbago with sciatica, right side: Secondary | ICD-10-CM

## 2021-09-29 DIAGNOSIS — M62838 Other muscle spasm: Secondary | ICD-10-CM

## 2021-09-29 NOTE — Therapy (Signed)
[facility] [address]  [place], [place], [address] Phone: [phone]   Fax:  [phone]  Physical Therapy Treatment  Patient Details  Name: Tyrone Diaz MRN: [id number] Date of Birth: 1941/06/22 No data recorded  Encounter Date: 09/29/2021   PT End of Session - 09/29/21 1531     Visit Number 3    Number of Visits 12    Date for PT Re-Evaluation 11/03/21    Authorization Type Medicare + BCBS supplement    Progress Note Due on Visit 10    PT Start Time 1448    PT Stop Time 1528    PT Time Calculation (min) 40 min    Activity Tolerance Patient tolerated treatment well    Behavior During Therapy [facility] for tasks assessed/performed             Past Medical History:  Diagnosis Date   ANEMIA    ANXIETY    ARTHRITIS, CERVICAL SPINE    BCC (basal cell carcinoma of skin)    Cataracts, bilateral    GERD    GLAUCOMA, BILATERAL    History of stress test 12/2001   Showed normal perfusion with mild apical thinning.   History of stress test 11/2007   Post stress myocardial perfusion images show a normal pattern of perfusion in the all regions, the post stress left ventricular is normal in size. There is no scintigraphic evidence of inducible myocardial ischemia. The post EF 64%   Hx of echocardiogram 12/2001   Had previously shown mild MR as well as mild TR with normal systolic function.   Hx of echocardiogram 11/2006   5-years follow-up, this showed normal systolic as well as diastolic function. There was suggestion of flat mitral valve leaflet coaptation with borderline anterior mitral valve leaflet prolapse with mild associated mitral valve prolapse. He had mild TR. There was very mild aortic valve sclerosis without stenosis or insufficiency.   HYPERLIPIDEMIA    HYPOTHYROIDISM    PROCTITIS    TESTICULAR HYPOFUNCTION    Unspecified vitamin D deficiency    VARICOSE VEINS, LOWER EXTREMITIES     Past  Surgical History:  Procedure Laterality Date   BACK SURGERY  2019   per pt, low back surgery   DENTAL SURGERY     ear injury     piece of ear plug dislodged from ear   TONSILECTOMY, ADENOIDECTOMY, BILATERAL MYRINGOTOMY AND TUBES  09/11/1941   WISDOM TOOTH EXTRACTION      There were no vitals filed for this visit.   Subjective Assessment - 09/29/21 1451     Subjective Pt notes some HS cramping with the bridges at home, noting less pain in the back.    Pertinent History spinal surgery L1-L5 laminectomy, varicose vein stripping.    Diagnostic tests lumbar xray L1-2, L2-3, L3-4 and L4-5 with DDD and disc narrowing and anterior spurs,   no anterolisthesis, the neuroforamen are narrowed at L3-4 and L4-5 on   lateral radiographs.    Patient Stated Goals "alleivate the leg pain with driving"    Currently in Pain? No/denies                               [facility] Adult PT Treatment/Exercise - 09/29/21 0001       Exercises   Exercises Lumbar      Lumbar Exercises: Stretches   Active Hamstring Stretch Left;30 seconds  Active Hamstring Stretch Limitations seated    Figure 4 Stretch 30 seconds;Seated;With overpressure;3 reps      Lumbar Exercises: Aerobic   Recumbent Bike L2x27min      Lumbar Exercises: Supine   Bridge 15 reps;3 seconds    Bridge Limitations with ball squeeze    Straight Leg Raise 10 reps;2 seconds      Lumbar Exercises: Prone   Straight Leg Raise 10 reps;2 seconds    Other Prone Lumbar Exercises HS curls 2x10    Other Prone Lumbar Exercises plank, able to hold for 40 sec      Manual Therapy   Manual Therapy Soft tissue mobilization;Myofascial release    Soft tissue mobilization STM to R lateral HS, piriformis    Myofascial Release TPR to R piriforms and lateral HS muscle belly                       PT Short Term Goals - 09/26/21 1649       PT SHORT TERM GOAL #1   Title Pt. will be independent with initial HEP for hip  strengthening/ROM.    Time 2    Period Weeks    Status On-going    Target Date 10/06/21               PT Long Term Goals - 09/26/21 1649       PT LONG TERM GOAL #1   Title Independent with ongoing/advanced HEP    Time 6    Period Weeks    Status On-going    Target Date 11/03/21      PT LONG TERM GOAL #2   Title Pt. will be able to drive for 1.5 hours without increased RLE pain to travel.    Baseline 30-45 min before severe pain    Time 6    Period Weeks    Status On-going    Target Date 11/03/21                   Plan - 09/29/21 1532     Clinical Impression Statement Pt notes improvements in his ability to sit w/o pain. He did ask for modification to the bridges today because he tends to cramp in his hamstring muscles and we found an alternative. He was able to hold a plank for 40 sec today while keeping a nuetral spine, cues also needed to keep nuetral spine during planks. He note that his glutes and hamstrings are still tight, which was addressed with STM.    Personal Factors and Comorbidities Comorbidity 2    Comorbidities history of LBP and back surgery, history varicose veing stripping in R leg, spinal stenosis    PT Frequency 2x / week    PT Duration 6 weeks    PT Treatment/Interventions ADLs/Self Care Home Management;Cryotherapy;Electrical Stimulation;Iontophoresis 4mg /ml Dexamethasone;Moist Heat;Traction;Ultrasound;Functional mobility training;Therapeutic activities;Therapeutic exercise;Neuromuscular re-education;Patient/family education;Dry needling;Manual techniques;Taping;Spinal Manipulations;Joint Manipulations;Passive range of motion    PT Next Visit Plan hip/core strengthening, manual therapy consider dry needling R glut med/R HS, modalities PRN    PT Home Exercise Plan Access Code: [email or web address]    Consulted and Agree with Plan of Care Patient             Patient will benefit from skilled therapeutic intervention in order to improve the following  deficits and impairments:  Decreased range of motion, Increased muscle spasms, Impaired flexibility, Pain, Hypomobility, Increased fascial restricitons, Decreased strength, Decreased activity tolerance  Visit Diagnosis: Acute right-sided low  back pain with right-sided sciatica  Other muscle spasm     Problem List Patient Active Problem List   Diagnosis Date Noted   BCC (basal cell carcinoma of skin) 12/01/2019   History of lumbar laminectomy 10/28/2018   PCP NOTES >>>>>>>>>>>>>>>>> 10/17/2016   Erectile dysfunction 04/13/2015   Hyperglycemia 10/13/2014   GERD (gastroesophageal reflux disease) 10/13/2014   Edema leg 07/02/2014   Palpitations 07/02/2014   Annual physical exam 08/05/2012   h/o goiter, remote Bx 08/05/2012   Allergic rhinitis 12/28/2011   Hypogonadism in male 04/28/2010   Vitamin D deficiency 04/28/2010   Dyslipidemia 04/28/2010   VARICOSE VEINS, LOWER EXTREMITIES 04/28/2010   IBS (irritable bowel syndrome) 04/28/2010   Anxiety state 07/23/2008   GLAUCOMA, BILATERAL 07/23/2008   PROCTITIS- anal pruritus 07/23/2008   Spinal stenosis 07/23/2008    Artist Pais, PTA 09/29/2021, 3:36 PM  [facility] [address]  [place], [place], [address] Phone: [phone]   Fax:  [phone]  Name: Tyrone Diaz MRN: [id number] Date of Birth: 09-09-1941

## 2021-10-04 ENCOUNTER — Other Ambulatory Visit: Payer: Self-pay

## 2021-10-04 ENCOUNTER — Ambulatory Visit: Payer: Medicare Other

## 2021-10-04 DIAGNOSIS — M5441 Lumbago with sciatica, right side: Secondary | ICD-10-CM

## 2021-10-04 DIAGNOSIS — M62838 Other muscle spasm: Secondary | ICD-10-CM | POA: Diagnosis not present

## 2021-10-04 NOTE — Therapy (Signed)
[facility] [address]  [place], [place], [address] Phone: [phone]   Fax:  [phone]  Physical Therapy Treatment  Patient Details  Name: Tyrone Diaz MRN: [id number] Date of Birth: [birth year] No data recorded  Encounter Date: 10/04/2021   PT End of Session - 10/04/21 1534     Visit Number 4    Number of Visits 12    Date for PT Re-Evaluation 11/03/21    Authorization Type Medicare + BCBS supplement    Progress Note Due on Visit 10    PT Start Time 1449    PT Stop Time 1530    PT Time Calculation (min) 41 min    Activity Tolerance Patient tolerated treatment well    Behavior During Therapy [facility] for tasks assessed/performed             Past Medical History:  Diagnosis Date   ANEMIA    ANXIETY    ARTHRITIS, CERVICAL SPINE    BCC (basal cell carcinoma of skin)    Cataracts, bilateral    GERD    GLAUCOMA, BILATERAL    History of stress test 12/2001   Showed normal perfusion with mild apical thinning.   History of stress test 11/2007   Post stress myocardial perfusion images show a normal pattern of perfusion in the all regions, the post stress left ventricular is normal in size. There is no scintigraphic evidence of inducible myocardial ischemia. The post EF 64%   Hx of echocardiogram 12/2001   Had previously shown mild MR as well as mild TR with normal systolic function.   Hx of echocardiogram 11/2006   5-years follow-up, this showed normal systolic as well as diastolic function. There was suggestion of flat mitral valve leaflet coaptation with borderline anterior mitral valve leaflet prolapse with mild associated mitral valve prolapse. He had mild TR. There was very mild aortic valve sclerosis without stenosis or insufficiency.   HYPERLIPIDEMIA    HYPOTHYROIDISM    PROCTITIS    TESTICULAR HYPOFUNCTION    Unspecified vitamin D deficiency    VARICOSE VEINS, LOWER EXTREMITIES     Past  Surgical History:  Procedure Laterality Date   BACK SURGERY  2019   per pt, low back surgery   DENTAL SURGERY     ear injury     piece of ear plug dislodged from ear   TONSILECTOMY, ADENOIDECTOMY, BILATERAL MYRINGOTOMY AND TUBES  09/11/1941   WISDOM TOOTH EXTRACTION      There were no vitals filed for this visit.   Subjective Assessment - 10/04/21 1450     Subjective Pt notes that the therapy is really helping, having less discomfort when sitting.    Pertinent History spinal surgery L1-L5 laminectomy, varicose vein stripping.    Diagnostic tests lumbar xray L1-2, L2-3, L3-4 and L4-5 with DDD and disc narrowing and anterior spurs,   no anterolisthesis, the neuroforamen are narrowed at L3-4 and L4-5 on   lateral radiographs.    Patient Stated Goals "alleivate the leg pain with driving"    Currently in Pain? No/denies                               [facility] Adult PT Treatment/Exercise - 10/04/21 0001       Lumbar Exercises: Aerobic   Recumbent Bike L3x49min      Lumbar Exercises: Machines for Strengthening   Cybex Lumbar Extension 20lb  2x10    Cybex Knee Extension 20lb 2x10    Other Lumbar Machine Exercise lat pulls 20lb 15 reps      Lumbar Exercises: Standing   Heel Raises 10 reps;3 seconds    Other Standing Lumbar Exercises B hip abd + ext 10x      Lumbar Exercises: Sidelying   Clam Right;10 reps;3 seconds    Clam Limitations green TB    Other Sidelying Lumbar Exercises side plank 30 sec hold, R/L      Lumbar Exercises: Prone   Other Prone Lumbar Exercises plank holding for 1 min      Lumbar Exercises: Quadruped   Opposite Arm/Leg Raise Right arm/Left leg;Left arm/Right leg;10 reps;3 seconds                     PT Education - 10/04/21 1533     Education Details HEP update, edu on alternating days for strengthening program to avoid muscle overuse.    Person(s) Educated Patient    Methods Explanation;Demonstration;Verbal cues;Handout     Comprehension Verbalized understanding;Returned demonstration;Verbal cues required;Need further instruction              PT Short Term Goals - 10/04/21 1457       PT SHORT TERM GOAL #1   Title Pt. will be independent with initial HEP for hip strengthening/ROM.    Time 2    Period Weeks    Status Achieved   10/04/21   Target Date 10/06/21               PT Long Term Goals - 09/26/21 1649       PT LONG TERM GOAL #1   Title Independent with ongoing/advanced HEP    Time 6    Period Weeks    Status On-going    Target Date 11/03/21      PT LONG TERM GOAL #2   Title Pt. will be able to drive for 1.5 hours without increased RLE pain to travel.    Baseline 30-45 min before severe pain    Time 6    Period Weeks    Status On-going    Target Date 11/03/21                   Plan - 10/04/21 1538     Clinical Impression Statement Pt denies questions or concerns with current HEP. We were able to progress with weight machines today and update HEP to include more strengthening exercises. He continues to note improvement in buttock pain when sitting but has yet to take a trip requiring him to sit for prolonged periods, so he is unsure how long he can sit exactly w/o pain. He tolerated the progression on exercises well today. The side planks were pretty difficult for him. Cuing needed for proper mechanics with the bird dog exercise and all other exercises. Pt is making good progress.    Personal Factors and Comorbidities Comorbidity 2    Comorbidities history of LBP and back surgery, history varicose veing stripping in R leg, spinal stenosis    PT Frequency 2x / week    PT Duration 6 weeks    PT Treatment/Interventions ADLs/Self Care Home Management;Cryotherapy;Electrical Stimulation;Iontophoresis 4mg /ml Dexamethasone;Moist Heat;Traction;Ultrasound;Functional mobility training;Therapeutic activities;Therapeutic exercise;Neuromuscular re-education;Patient/family education;Dry  needling;Manual techniques;Taping;Spinal Manipulations;Joint Manipulations;Passive range of motion    PT Next Visit Plan consider 1x per week frequency; hip/core strengthening, manual therapy consider dry needling R glut med/R HS, modalities PRN    PT Home  Exercise Plan Access Code: [email or web address]    [email or web address] and Agree with Plan of Care Patient             Patient will benefit from skilled therapeutic intervention in order to improve the following deficits and impairments:  Decreased range of motion, Increased muscle spasms, Impaired flexibility, Pain, Hypomobility, Increased fascial restricitons, Decreased strength, Decreased activity tolerance  Visit Diagnosis: Acute right-sided low back pain with right-sided sciatica  Other muscle spasm     Problem List Patient Active Problem List   Diagnosis Date Noted   BCC (basal cell carcinoma of skin) 12/01/2019   History of lumbar laminectomy 10/28/2018   PCP NOTES >>>>>>>>>>>>>>>>> 10/17/2016   Erectile dysfunction 04/13/2015   Hyperglycemia 10/13/2014   GERD (gastroesophageal reflux disease) 10/13/2014   Edema leg 07/02/2014   Palpitations 07/02/2014   Annual physical exam 08/05/2012   h/o goiter, remote Bx 08/05/2012   Allergic rhinitis 12/28/2011   Hypogonadism in male 04/28/2010   Vitamin D deficiency 04/28/2010   Dyslipidemia 04/28/2010   VARICOSE VEINS, LOWER EXTREMITIES 04/28/2010   IBS (irritable bowel syndrome) 04/28/2010   Anxiety state 07/23/2008   GLAUCOMA, BILATERAL 07/23/2008   PROCTITIS- anal pruritus 07/23/2008   Spinal stenosis 07/23/2008    Artist Pais, PTA 10/04/2021, 3:40 PM  [facility] [address]  [place], [place], [address] Phone: [phone]   Fax:  [phone]  Name: YASSIR ENIS MRN: [id number] Date of Birth: 1940-12-15

## 2021-10-04 NOTE — Patient Instructions (Signed)
Access Code: [email or web address] URL: [email or web address] Date: 10/04/2021 Prepared by: Clarene Essex  Exercises Seated Hamstring Stretch - 2 x daily - 7 x weekly - 1 sets - 3 reps - 30-45 sec hold Seated Piriformis Stretch with Trunk Bend - 2 x daily - 7 x weekly - 1 sets - 3 reps - 30-45 hold Supine Bridge with Mini Swiss Ball Between Knees - 1 x daily - 3-4 x weekly - 3 sets - 10 reps - 5 sec hold Clamshell - 1 x daily - 3-4 x weekly - 3 sets - 10 reps - 5 sec hold Bird Dog - 1 x daily - 3-4 x weekly - 3 sets - 10 reps Marching with Same-Side Arm Raise - 1 x daily - 3-4 x weekly - 3 sets - 10 reps

## 2021-10-06 ENCOUNTER — Other Ambulatory Visit: Payer: Self-pay

## 2021-10-06 ENCOUNTER — Ambulatory Visit: Payer: Medicare Other

## 2021-10-06 DIAGNOSIS — M5441 Lumbago with sciatica, right side: Secondary | ICD-10-CM

## 2021-10-06 DIAGNOSIS — M62838 Other muscle spasm: Secondary | ICD-10-CM

## 2021-10-06 NOTE — Therapy (Signed)
[facility] [address]  [place], [place], [address] Phone: [phone]   Fax:  [phone]  Physical Therapy Treatment  Patient Details  Name: Tyrone Diaz MRN: [id number] Date of Birth: 1941/02/26 No data recorded  Encounter Date: 10/06/2021   PT End of Session - 10/06/21 1525     Visit Number 5    Number of Visits 12    Date for PT Re-Evaluation 11/03/21    Authorization Type Medicare + BCBS supplement    Progress Note Due on Visit 10    PT Start Time 1450   pt late   PT Stop Time 1526    PT Time Calculation (min) 36 min    Activity Tolerance Patient tolerated treatment well    Behavior During Therapy [facility] for tasks assessed/performed             Past Medical History:  Diagnosis Date   ANEMIA    ANXIETY    ARTHRITIS, CERVICAL SPINE    BCC (basal cell carcinoma of skin)    Cataracts, bilateral    GERD    GLAUCOMA, BILATERAL    History of stress test 12/2001   Showed normal perfusion with mild apical thinning.   History of stress test 11/2007   Post stress myocardial perfusion images show a normal pattern of perfusion in the all regions, the post stress left ventricular is normal in size. There is no scintigraphic evidence of inducible myocardial ischemia. The post EF 64%   Hx of echocardiogram 12/2001   Had previously shown mild MR as well as mild TR with normal systolic function.   Hx of echocardiogram 11/2006   5-years follow-up, this showed normal systolic as well as diastolic function. There was suggestion of flat mitral valve leaflet coaptation with borderline anterior mitral valve leaflet prolapse with mild associated mitral valve prolapse. He had mild TR. There was very mild aortic valve sclerosis without stenosis or insufficiency.   HYPERLIPIDEMIA    HYPOTHYROIDISM    PROCTITIS    TESTICULAR HYPOFUNCTION    Unspecified vitamin D deficiency    VARICOSE VEINS, LOWER EXTREMITIES      Past Surgical History:  Procedure Laterality Date   BACK SURGERY  2019   per pt, low back surgery   DENTAL SURGERY     ear injury     piece of ear plug dislodged from ear   TONSILECTOMY, ADENOIDECTOMY, BILATERAL MYRINGOTOMY AND TUBES  09/11/1941   WISDOM TOOTH EXTRACTION      There were no vitals filed for this visit.   Subjective Assessment - 10/06/21 1451     Subjective Everything is going just fine, still improving.    Pertinent History spinal surgery L1-L5 laminectomy, varicose vein stripping.    Diagnostic tests lumbar xray L1-2, L2-3, L3-4 and L4-5 with DDD and disc narrowing and anterior spurs,   no anterolisthesis, the neuroforamen are narrowed at L3-4 and L4-5 on   lateral radiographs.    Patient Stated Goals "alleivate the leg pain with driving"    Currently in Pain? No/denies                               [facility] Adult PT Treatment/Exercise - 10/06/21 0001       Exercises   Exercises Lumbar      Lumbar Exercises: Stretches   Active Hamstring Stretch Right;Left;30 seconds    Active Hamstring Stretch Limitations  seated hip hinge      Lumbar Exercises: Aerobic   Recumbent Bike L3x61min      Lumbar Exercises: Standing   Heel Raises 20 reps;2 seconds    Functional Squats 10 reps   2 sets, 2nd set with yellow weight ball and OHP   Row Strengthening;Both;10 reps;Theraband    Theraband Level (Row) Level 2 (Red)    Other Standing Lumbar Exercises horizontal abduction with red TB 10x    Other Standing Lumbar Exercises B hip + ext 20 reps each      Lumbar Exercises: Seated   Long Arc Quad on Chair Strengthening;Both;10 reps    LAQ on Chair Limitations with ball squeeze      Lumbar Exercises: Sidelying   Other Sidelying Lumbar Exercises side plank 20 sec hold, R/L   pt limited by some cramping in the R calf                      PT Short Term Goals - 10/04/21 1457       PT SHORT TERM GOAL #1   Title Pt. will be independent  with initial HEP for hip strengthening/ROM.    Time 2    Period Weeks    Status Achieved   10/04/21   Target Date 10/06/21               PT Long Term Goals - 09/26/21 1649       PT LONG TERM GOAL #1   Title Independent with ongoing/advanced HEP    Time 6    Period Weeks    Status On-going    Target Date 11/03/21      PT LONG TERM GOAL #2   Title Pt. will be able to drive for 1.5 hours without increased RLE pain to travel.    Baseline 30-45 min before severe pain    Time 6    Period Weeks    Status On-going    Target Date 11/03/21                   Plan - 10/06/21 1528     Clinical Impression Statement Pt was able to progress to body weight squats today, afterwards did report some increased sensation in his R HS but resolved with stretching. Pt was able to complete all interventions today w/o increased pain. He still struggles with side planks and higher level core exercises. Did require cues for posterior WS with squats. Pt has made good progress, so will drop frequency to 1x per week.    Personal Factors and Comorbidities Comorbidity 2    Comorbidities history of LBP and back surgery, history varicose veing stripping in R leg, spinal stenosis    PT Frequency 2x / week    PT Duration 6 weeks    PT Treatment/Interventions ADLs/Self Care Home Management;Cryotherapy;Electrical Stimulation;Iontophoresis 4mg /ml Dexamethasone;Moist Heat;Traction;Ultrasound;Functional mobility training;Therapeutic activities;Therapeutic exercise;Neuromuscular re-education;Patient/family education;Dry needling;Manual techniques;Taping;Spinal Manipulations;Joint Manipulations;Passive range of motion    PT Next Visit Plan hip/core strengthening, manual therapy consider dry needling R glut med/R HS, modalities PRN    PT Home Exercise Plan Access Code: [email or web address]    Consulted and Agree with Plan of Care Patient             Patient will benefit from skilled therapeutic intervention in  order to improve the following deficits and impairments:  Decreased range of motion, Increased muscle spasms, Impaired flexibility, Pain, Hypomobility, Increased fascial restricitons, Decreased strength, Decreased activity tolerance  Visit  Diagnosis: Acute right-sided low back pain with right-sided sciatica  Other muscle spasm     Problem List Patient Active Problem List   Diagnosis Date Noted   BCC (basal cell carcinoma of skin) 12/01/2019   History of lumbar laminectomy 10/28/2018   PCP NOTES >>>>>>>>>>>>>>>>> 10/17/2016   Erectile dysfunction 04/13/2015   Hyperglycemia 10/13/2014   GERD (gastroesophageal reflux disease) 10/13/2014   Edema leg 07/02/2014   Palpitations 07/02/2014   Annual physical exam 08/05/2012   h/o goiter, remote Bx 08/05/2012   Allergic rhinitis 12/28/2011   Hypogonadism in male 04/28/2010   Vitamin D deficiency 04/28/2010   Dyslipidemia 04/28/2010   VARICOSE VEINS, LOWER EXTREMITIES 04/28/2010   IBS (irritable bowel syndrome) 04/28/2010   Anxiety state 07/23/2008   GLAUCOMA, BILATERAL 07/23/2008   PROCTITIS- anal pruritus 07/23/2008   Spinal stenosis 07/23/2008    Artist Pais, PTA 10/06/2021, 3:40 PM  [facility] [address]  [place], [place], [address] Phone: [phone]   Fax:  [phone]  Name: ABLE MALLOY MRN: [id number] Date of Birth: 1941-01-02

## 2021-10-11 ENCOUNTER — Encounter: Payer: Medicare Other | Admitting: Physical Therapy

## 2021-10-13 ENCOUNTER — Other Ambulatory Visit: Payer: Self-pay

## 2021-10-13 ENCOUNTER — Ambulatory Visit: Payer: Medicare Other | Attending: Orthopaedic Surgery

## 2021-10-13 DIAGNOSIS — M62838 Other muscle spasm: Secondary | ICD-10-CM | POA: Insufficient documentation

## 2021-10-13 DIAGNOSIS — M5441 Lumbago with sciatica, right side: Secondary | ICD-10-CM | POA: Insufficient documentation

## 2021-10-13 NOTE — Therapy (Addendum)
PHYSICAL THERAPY DISCHARGE SUMMARY (11/14/2021)  Visits from Start of Care: 6  Current functional level related to goals / functional outcomes: Decreased pain, improved sitting tolerance. See treatment note below.    Remaining deficits: Increased pain with sitting >30 min    Education / Equipment: HEP  Plan: Patient is being discharged due to not returning to therapy after 10/13/2021 visit.  He would require new order to return to therapy if still needed.       Rennie Natter, PT, DPT 11/14/2021   [facility] [address]  [place], [place], [address] Phone: [phone]   Fax:  [phone]  Physical Therapy Treatment  Patient Details  Name: Tyrone Diaz MRN: [id number] Date of Birth: [birth year] No data recorded  Encounter Date: 10/13/2021   PT End of Session - 10/13/21 1531     Visit Number 6    Number of Visits 12    Date for PT Re-Evaluation 11/03/21    Authorization Type Medicare + BCBS supplement    Progress Note Due on Visit 10    PT Start Time 1449    PT Stop Time 1528    PT Time Calculation (min) 39 min    Activity Tolerance Patient tolerated treatment well    Behavior During Therapy [facility] for tasks assessed/performed             Past Medical History:  Diagnosis Date   ANEMIA    ANXIETY    ARTHRITIS, CERVICAL SPINE    BCC (basal cell carcinoma of skin)    Cataracts, bilateral    GERD    GLAUCOMA, BILATERAL    History of stress test 12/2001   Showed normal perfusion with mild apical thinning.   History of stress test 11/2007   Post stress myocardial perfusion images show a normal pattern of perfusion in the all regions, the post stress left ventricular is normal in size. There is no scintigraphic evidence of inducible myocardial ischemia. The post EF 64%   Hx of echocardiogram 12/2001   Had previously shown mild MR as well as mild TR with normal systolic function.   Hx of  echocardiogram 11/2006   5-years follow-up, this showed normal systolic as well as diastolic function. There was suggestion of flat mitral valve leaflet coaptation with borderline anterior mitral valve leaflet prolapse with mild associated mitral valve prolapse. He had mild TR. There was very mild aortic valve sclerosis without stenosis or insufficiency.   HYPERLIPIDEMIA    HYPOTHYROIDISM    PROCTITIS    TESTICULAR HYPOFUNCTION    Unspecified vitamin D deficiency    VARICOSE VEINS, LOWER EXTREMITIES     Past Surgical History:  Procedure Laterality Date   BACK SURGERY  2019   per pt, low back surgery   DENTAL SURGERY     ear injury     piece of ear plug dislodged from ear   TONSILECTOMY, ADENOIDECTOMY, BILATERAL MYRINGOTOMY AND TUBES  09/11/1941   WISDOM TOOTH EXTRACTION      There were no vitals filed for this visit.   Subjective Assessment - 10/13/21 1452     Subjective Pt reports that he stays busy and on his feet but when he sits most of the time his buttock doesn't bother him but sometimes it gets achy.    Pertinent History spinal surgery L1-L5 laminectomy, varicose vein stripping.    Diagnostic tests lumbar xray L1-2, L2-3, L3-4 and L4-5 with DDD and disc narrowing  and anterior spurs,   no anterolisthesis, the neuroforamen are narrowed at L3-4 and L4-5 on   lateral radiographs.    Patient Stated Goals "alleivate the leg pain with driving"    Currently in Pain? No/denies                               [facility] Adult PT Treatment/Exercise - 10/13/21 0001       Lumbar Exercises: Stretches   Standing Extension 10 reps;5 seconds    Standing Extension Limitations over the counter      Lumbar Exercises: Aerobic   Recumbent Bike L3x42min      Lumbar Exercises: Machines for Strengthening   Other Lumbar Machine Exercise pallof press 10lb 10x R/L      Lumbar Exercises: Standing   Heel Raises 20 reps;2 seconds    Functional Squats 15 reps      Knee/Hip  Exercises: Standing   Hip Abduction Stengthening;Both;10 reps;Knee straight    Abduction Limitations ABD + ext    Forward Step Up Both;2 sets;10 reps;Hand Hold: 0;Step Height: 8"                       PT Short Term Goals - 10/04/21 1457       PT SHORT TERM GOAL #1   Title Pt. will be independent with initial HEP for hip strengthening/ROM.    Time 2    Period Weeks    Status Achieved   10/04/21   Target Date 10/06/21               PT Long Term Goals - 10/13/21 1510       PT LONG TERM GOAL #1   Title Independent with ongoing/advanced HEP    Time 6    Period Weeks    Status On-going    Target Date 11/03/21      PT LONG TERM GOAL #2   Title Pt. will be able to drive for 1.5 hours without increased RLE pain to travel.    Baseline 30-45 min before severe pain    Time 6    Period Weeks    Status On-going   has moderate pain sitting after ~30 min   Target Date 11/03/21                   Plan - 10/13/21 1532     Clinical Impression Statement Tyrone Diaz is progressing well towards being able to drive w/o severe buttock pain. He now only has moderate pain after sitting for ~30 min. Able to progress LE chain strengthening to improve lumbopelvic support. Modified squats with a foam roll under toes to prevent posterior sway. He showed some instability with the multifidus walkouts. Cues given as needed with exercises to prevent compensation and to target the correct muscles.    Personal Factors and Comorbidities Comorbidity 2    Comorbidities history of LBP and back surgery, history varicose veing stripping in R leg, spinal stenosis    PT Frequency 2x / week    PT Duration 6 weeks    PT Treatment/Interventions ADLs/Self Care Home Management;Cryotherapy;Electrical Stimulation;Iontophoresis 4mg /ml Dexamethasone;Moist Heat;Traction;Ultrasound;Functional mobility training;Therapeutic activities;Therapeutic exercise;Neuromuscular re-education;Patient/family  education;Dry needling;Manual techniques;Taping;Spinal Manipulations;Joint Manipulations;Passive range of motion    PT Next Visit Plan hip/core strengthening, manual therapy consider dry needling R glut med/R HS, modalities PRN    PT Home Exercise Plan Access Code: [email or web address]    Consulted and Agree  with Plan of Care Patient             Patient will benefit from skilled therapeutic intervention in order to improve the following deficits and impairments:  Decreased range of motion, Increased muscle spasms, Impaired flexibility, Pain, Hypomobility, Increased fascial restricitons, Decreased strength, Decreased activity tolerance  Visit Diagnosis: Acute right-sided low back pain with right-sided sciatica  Other muscle spasm     Problem List Patient Active Problem List   Diagnosis Date Noted   BCC (basal cell carcinoma of skin) 12/01/2019   History of lumbar laminectomy 10/28/2018   PCP NOTES >>>>>>>>>>>>>>>>> 10/17/2016   Erectile dysfunction 04/13/2015   Hyperglycemia 10/13/2014   GERD (gastroesophageal reflux disease) 10/13/2014   Edema leg 07/02/2014   Palpitations 07/02/2014   Annual physical exam 08/05/2012   h/o goiter, remote Bx 08/05/2012   Allergic rhinitis 12/28/2011   Hypogonadism in male 04/28/2010   Vitamin D deficiency 04/28/2010   Dyslipidemia 04/28/2010   VARICOSE VEINS, LOWER EXTREMITIES 04/28/2010   IBS (irritable bowel syndrome) 04/28/2010   Anxiety state 07/23/2008   GLAUCOMA, BILATERAL 07/23/2008   PROCTITIS- anal pruritus 07/23/2008   Spinal stenosis 07/23/2008    Artist Pais, PTA 10/13/2021, 3:36 PM  [facility] [address]  [place], [place], [address] Phone: [phone]   Fax:  [phone]  Name: Tyrone Diaz MRN: [id number] Date of Birth: Sep 27, 1940

## 2021-10-17 DIAGNOSIS — G4700 Insomnia, unspecified: Secondary | ICD-10-CM | POA: Diagnosis not present

## 2021-10-17 DIAGNOSIS — M4807 Spinal stenosis, lumbosacral region: Secondary | ICD-10-CM | POA: Diagnosis not present

## 2021-10-17 DIAGNOSIS — M62830 Muscle spasm of back: Secondary | ICD-10-CM | POA: Diagnosis not present

## 2021-10-17 DIAGNOSIS — G894 Chronic pain syndrome: Secondary | ICD-10-CM | POA: Diagnosis not present

## 2021-10-18 ENCOUNTER — Encounter: Payer: Medicare Other | Admitting: Physical Therapy

## 2021-10-20 ENCOUNTER — Telehealth: Payer: Self-pay | Admitting: Internal Medicine

## 2021-10-20 ENCOUNTER — Ambulatory Visit: Payer: Medicare Other

## 2021-10-20 NOTE — Telephone Encounter (Signed)
Left message for patient to call back and schedule Medicare Annual Wellness Visit (AWV) in office.   If not able to come in office, please offer to do virtually or by telephone.  Left office number and my jabber [phone].  Last AWV:10/19/2017  Please schedule at anytime with Nurse Health Advisor.

## 2021-11-08 ENCOUNTER — Ambulatory Visit: Payer: Medicare Other

## 2021-11-10 ENCOUNTER — Encounter: Payer: Self-pay | Admitting: Internal Medicine

## 2021-11-14 DIAGNOSIS — Z79891 Long term (current) use of opiate analgesic: Secondary | ICD-10-CM | POA: Diagnosis not present

## 2021-11-14 DIAGNOSIS — M62830 Muscle spasm of back: Secondary | ICD-10-CM | POA: Diagnosis not present

## 2021-11-14 DIAGNOSIS — G894 Chronic pain syndrome: Secondary | ICD-10-CM | POA: Diagnosis not present

## 2021-11-14 DIAGNOSIS — G4700 Insomnia, unspecified: Secondary | ICD-10-CM | POA: Diagnosis not present

## 2021-11-14 DIAGNOSIS — M4807 Spinal stenosis, lumbosacral region: Secondary | ICD-10-CM | POA: Diagnosis not present

## 2021-12-14 DIAGNOSIS — G4700 Insomnia, unspecified: Secondary | ICD-10-CM | POA: Diagnosis not present

## 2021-12-14 DIAGNOSIS — M62830 Muscle spasm of back: Secondary | ICD-10-CM | POA: Diagnosis not present

## 2021-12-14 DIAGNOSIS — G894 Chronic pain syndrome: Secondary | ICD-10-CM | POA: Diagnosis not present

## 2021-12-14 DIAGNOSIS — M4807 Spinal stenosis, lumbosacral region: Secondary | ICD-10-CM | POA: Diagnosis not present

## 2022-01-16 DIAGNOSIS — M4807 Spinal stenosis, lumbosacral region: Secondary | ICD-10-CM | POA: Diagnosis not present

## 2022-01-16 DIAGNOSIS — G4700 Insomnia, unspecified: Secondary | ICD-10-CM | POA: Diagnosis not present

## 2022-01-16 DIAGNOSIS — G894 Chronic pain syndrome: Secondary | ICD-10-CM | POA: Diagnosis not present

## 2022-01-16 DIAGNOSIS — M62830 Muscle spasm of back: Secondary | ICD-10-CM | POA: Diagnosis not present

## 2022-02-15 DIAGNOSIS — M62830 Muscle spasm of back: Secondary | ICD-10-CM | POA: Diagnosis not present

## 2022-02-15 DIAGNOSIS — M4807 Spinal stenosis, lumbosacral region: Secondary | ICD-10-CM | POA: Diagnosis not present

## 2022-02-15 DIAGNOSIS — G4700 Insomnia, unspecified: Secondary | ICD-10-CM | POA: Diagnosis not present

## 2022-02-15 DIAGNOSIS — G894 Chronic pain syndrome: Secondary | ICD-10-CM | POA: Diagnosis not present

## 2022-03-02 NOTE — Therapy (Signed)
OUTPATIENT PHYSICAL THERAPY THORACOLUMBAR EVALUATION   Patient Name: Tyrone Diaz MRN: [id number] DOB:September 02, 1941, 81 y.o., male Today's Date: 03/08/2022   PT End of Session - 03/08/22 1016     Visit Number 1    Authorization Type Medicare & BCBS    PT Start Time 1016    PT Stop Time 1106    PT Time Calculation (min) 50 min    Activity Tolerance Patient tolerated treatment well    Behavior During Therapy WFL for tasks assessed/performed             Past Medical History:  Diagnosis Date   ANEMIA    ANXIETY    ARTHRITIS, CERVICAL SPINE    BCC (basal cell carcinoma of skin)    Cataracts, bilateral    GERD    GLAUCOMA, BILATERAL    History of stress test 12/2001   Showed normal perfusion with mild apical thinning.   History of stress test 11/2007   Post stress myocardial perfusion images show a normal pattern of perfusion in the all regions, the post stress left ventricular is normal in size. There is no scintigraphic evidence of inducible myocardial ischemia. The post EF 64%   Hx of echocardiogram 12/2001   Had previously shown mild MR as well as mild TR with normal systolic function.   Hx of echocardiogram 11/2006   5-years follow-up, this showed normal systolic as well as diastolic function. There was suggestion of flat mitral valve leaflet coaptation with borderline anterior mitral valve leaflet prolapse with mild associated mitral valve prolapse. He had mild TR. There was very mild aortic valve sclerosis without stenosis or insufficiency.   HYPERLIPIDEMIA    HYPOTHYROIDISM    PROCTITIS    TESTICULAR HYPOFUNCTION    Unspecified vitamin D deficiency    VARICOSE VEINS, LOWER EXTREMITIES    Past Surgical History:  Procedure Laterality Date   BACK SURGERY  2019   per pt, low back surgery   DENTAL SURGERY     ear injury     piece of ear plug dislodged from ear   TONSILECTOMY, ADENOIDECTOMY, BILATERAL MYRINGOTOMY AND TUBES  09/11/1941   WISDOM TOOTH EXTRACTION      Patient Active Problem List   Diagnosis Date Noted   BCC (basal cell carcinoma of skin) 12/01/2019   History of lumbar laminectomy 10/28/2018   PCP NOTES >>>>>>>>>>>>>>>>> 10/17/2016   Erectile dysfunction 04/13/2015   Hyperglycemia 10/13/2014   GERD (gastroesophageal reflux disease) 10/13/2014   Edema leg 07/02/2014   Palpitations 07/02/2014   Annual physical exam 08/05/2012   h/o goiter, remote Bx 08/05/2012   Allergic rhinitis 12/28/2011   Hypogonadism in male 04/28/2010   Vitamin D deficiency 04/28/2010   Dyslipidemia 04/28/2010   VARICOSE VEINS, LOWER EXTREMITIES 04/28/2010   IBS (irritable bowel syndrome) 04/28/2010   Anxiety state 07/23/2008   GLAUCOMA, BILATERAL 07/23/2008   PROCTITIS- anal pruritus 07/23/2008   Spinal stenosis 07/23/2008    PCP: Colon Branch, MD  REFERRING PROVIDER: Margaretha Sheffield, MD  REFERRING DIAG: [phone] (ICD-10-CM) - Lumbar stenosis  RATIONALE FOR EVALUATION AND TREATMENT: Rehabilitation  THERAPY DIAG:  Other low back pain  Radiculopathy, lumbar region  Muscle spasm of back  Other muscle spasm  Muscle weakness (generalized)  ONSET DATE: ~6 months  SUBJECTIVE:  SUBJECTIVE STATEMENT: Over the past ~6 months, pt reports when he sits for long periods he will experience increased back pain. This is especially limiting with driving - can only tolerate up to ~30 min and after 45 minutes he has to pull over get out of the car to move around. He also notes tightness where his back and hips meet and is interested in trying DN.  PAIN:  Are you having pain? No: NPRS scale: 0/10 currently but with prolonged sitting can get up to 9/10 Pain location: R low back down into R posterior thigh Pain description: severe ache & tightness Aggravating factors: prolonged  sitting Relieving factors: getting up and moving around  PERTINENT HISTORY:  10/28/18 - L2-3, L3-4, L4-5 microscopic decompressive laminectomy; spinal stenosis, skin cancer, GERD, IBS, LE edema, HLD, LE varicose veins, anxiety, B glaucoma  PRECAUTIONS: None  WEIGHT BEARING RESTRICTIONS: No  FALLS:  Has patient fallen in last 6 months? No  LIVING ENVIRONMENT: Lives with: lives with their spouse Lives in: House/apartment Stairs: Yes: Internal: 14 steps; on right going up, on left going up, and can reach both Has following equipment at home: Gilford Rile - 2 wheeled  OCCUPATION: Retired  PLOF: Independent and Leisure: boating (ski boat); walking daily for ~1/4 mile  PATIENT GOALS: "To be able to drive an RV from [place] to [place] in October."   OBJECTIVE:   DIAGNOSTIC FINDINGS:  Lumbar x-ray 08/17/21: 5 lumbar vertebra. Hip and SI joints well-preserved. There is been a central laminectomy from L2-S1. Level disc degeneration present with no  evidence of anterolisthesis. Lumbar MRI 07/08/21: 1. Interval progression of degenerative changes at L2-3 which in  association with epidural lipomatosis resulting in severe narrowing of the thecal sac, moderate to severe right and severe left neural foraminal narrowing.  2. Postsurgical changes with interval resolution of high-grade spinal canal stenosis at L3-4 and L4-5 with persistent high-grade bilateral neural foraminal narrowing at these levels.  3. Mild right and moderate left neural foraminal narrowing at L1-2.  4. Moderate right and mild left neural foraminal narrowing at L5-S1.   Lumbar x-ray 10/01/18: L1-2, L2-3, L3-4 and L4-5 with DDD and disc narrowing and anterior spurs, no anterolisthesis, the neuroforamen are narrowed at L3-4 and L4-5 on lateral radiographs.  PATIENT SURVEYS:  FOTO Lumbar = 69; predicted = 70  SCREENING FOR RED FLAGS: Bowel or bladder incontinence: No Spinal tumors: No Cauda equina syndrome: No Compression fracture:  No Abdominal aneurysm: No  COGNITION:  Overall cognitive status: Within functional limits for tasks assessed     SENSATION: WFL  MUSCLE LENGTH: Hamstrings: mod tight R>L ITB: mild/mod tight R>L Piriformis: mod tight R>L Hip flexors: mod tight R>L Quads: mod tight R>L Heelcord: mod tight B  POSTURE: rounded shoulders, forward head, decreased lumbar lordosis, and decreased thoracic kyphosis  PALPATION: Increased muscle tension in B lumbar paraspinals and R>L glutes piriformis but denies TTP. Lumbar spine hypomobility with CPAs but no pain reported.  LUMBAR ROM:   Active  AROM  eval  Flexion Hands to toes  Extension WFL  Right lateral flexion WFL  Left lateral flexion WFL  Right rotation WFL  Left rotation WFL   (Blank rows = not tested)  LOWER EXTREMITY MMT:    MMT Right eval Left eval  Hip flexion 4+ 4+  Hip extension 4 4  Hip abduction 4- 4  Hip adduction 4+ 4+  Hip internal rotation 4+ 4+  Hip external rotation 4- 4  Knee flexion 4+ 4+  Knee  extension 5 5  Ankle dorsiflexion 4 4  Ankle plantarflexion 5 5  Ankle inversion    Ankle eversion     (Blank rows = not tested)  LUMBAR SPECIAL TESTS:  Straight leg raise test: Negative and FABER test: Negative   TODAY'S TREATMENT   03/08/22 THERAPEUTIC EXERCISE: Instruction in initial HEP (see below) to improve flexibility, strength and mobility.  Verbal and tactile cues throughout for technique.   PATIENT EDUCATION:  Education details: PT eval findings, anticipated POC, and initial HEP Person educated: Patient Education method: Explanation, Demonstration, Verbal cues, and Handouts Education comprehension: verbalized understanding, returned demonstration, verbal cues required, and needs further education   HOME EXERCISE PROGRAM: Access Code: [email or web address] URL: [email or web address] Date: 03/08/2022 Prepared by: Annie Paras  Exercises - Supine Quadriceps Stretch with Strap on Table  - 2 x  daily - 7 x weekly - 3 reps - 30 sec hold - Supine Hamstring Stretch with Strap  - 2 x daily - 7 x weekly - 3 reps - 30 sec hold - Supine ITB Stretch with Strap  - 2 x daily - 7 x weekly - 3 reps - 30 sec hold - Supine Figure 4 Piriformis Stretch  - 2 x daily - 7 x weekly - 3 reps - 30 sec hold - Supine Piriformis Stretch with Foot on Ground  - 2 x daily - 7 x weekly - 3 reps - 30 sec hold  ASSESSMENT:  CLINICAL IMPRESSION: Tyrone Diaz is a 81 y.o. male who was seen today for physical therapy evaluation and treatment for lumbar stenosis. His main concern is R-sided LBP radiating into R buttock and posterior thigh typically only when sitting for prolonged periods which especially limits driving tolerance. He also notes occasional limited control of R eccentric DF while walking leading to feeling of foot slap. Deficits include increased lumbopelvic muscle tension/tightness with limited R>L proximal LE flexibility, lumbar spine hypomobility, abnormal posture, and mild R>L LE weakness. Abnormal muscle tension likely contributing to sciatic nerve irritation resulting in his radicular LE pain. Joell will benefit from skilled physical therapy to decrease pain and improve ability to drive safely and travel.  OBJECTIVE IMPAIRMENTS decreased activity tolerance, decreased endurance, decreased knowledge of condition, decreased mobility, difficulty walking, decreased strength, increased fascial restrictions, impaired perceived functional ability, increased muscle spasms, impaired flexibility, improper body mechanics, postural dysfunction, and pain.   ACTIVITY LIMITATIONS sitting  PARTICIPATION LIMITATIONS: driving  PERSONAL FACTORS Age, Fitness, Past/current experiences, Time since onset of injury/illness/exacerbation, and 3+ comorbidities: L2-3, L3-4, L4-5 microscopic decompressive laminectomy; spinal stenosis, skin cancer, GERD, IBS, LE edema, HLD, LE varicose veins, anxiety, B glaucoma  are also  affecting patient's functional outcome.   REHAB POTENTIAL: Excellent  CLINICAL DECISION MAKING: Stable/uncomplicated  EVALUATION COMPLEXITY: Low   GOALS: Goals reviewed with patient? Yes  SHORT TERM GOALS: Target date: 03/29/2022   Patient will be independent with initial HEP.  Baseline:  Goal status: INITIAL  LONG TERM GOALS: Target date: 04/19/2022    Patient will be independent with advanced/ongoing HEP to improve outcomes and carryover.  Baseline:  Goal status: INITIAL  2.  Patient will report 75% improvement in low back pain and R LE radicular pain to improve QOL.  Baseline:  Goal status: INITIAL  3.  Patient to demonstrate ability to achieve and maintain good spinal alignment/posturing and body mechanics needed for daily activities. Baseline:  Goal status: INITIAL  4.  Patient will demonstrate improved B LE strength to >/= 4+ to 5/5 for improved  stability and ease of mobility . Baseline: Refer to above MMT chart Goal status: INITIAL  5.  Patient will report 58 on lumbar FOTO to demonstrate improved functional ability.  Baseline: 69 Goal status: INITIAL   6.  Patient will tolerate >/= 1.5 hrs of sitting while driving to improve tolerance for travel.  Baseline: 30-45 minute before severe pain causing him to have to stop driving  Goal status: INITIAL  7.  Patient will report ability to walk w/o noting R foot slap.  Baseline:  Goal status: INITIAL  PLAN: PT FREQUENCY: 2x/week  PT DURATION: 6 weeks  PLANNED INTERVENTIONS: Therapeutic exercises, Therapeutic activity, Neuromuscular re-education, Balance training, Gait training, Patient/Family education, Joint mobilization, Dry Needling, Electrical stimulation, Spinal mobilization, Cryotherapy, Moist heat, Taping, Traction, Ultrasound, Ionotophoresis '4mg'$ /ml Dexamethasone, Manual therapy, and Re-evaluation.  PLAN FOR NEXT SESSION: Review initial HEP and add heelcord stretch; initiate lumbopelvic strengthening and  update HEP accordingly; MT +/- DN to address increased muscle tension/tightness in lumbar paraspinals and glutes/piriformis   Percival Spanish, PT 03/08/2022, 1:32 PM

## 2022-03-08 ENCOUNTER — Ambulatory Visit: Payer: Medicare Other | Attending: Physical Medicine and Rehabilitation | Admitting: Physical Therapy

## 2022-03-08 ENCOUNTER — Encounter: Payer: Self-pay | Admitting: Physical Therapy

## 2022-03-08 ENCOUNTER — Other Ambulatory Visit: Payer: Self-pay

## 2022-03-08 DIAGNOSIS — M62830 Muscle spasm of back: Secondary | ICD-10-CM | POA: Insufficient documentation

## 2022-03-08 DIAGNOSIS — M5459 Other low back pain: Secondary | ICD-10-CM | POA: Insufficient documentation

## 2022-03-08 DIAGNOSIS — M5416 Radiculopathy, lumbar region: Secondary | ICD-10-CM | POA: Diagnosis not present

## 2022-03-08 DIAGNOSIS — M6281 Muscle weakness (generalized): Secondary | ICD-10-CM | POA: Diagnosis not present

## 2022-03-08 DIAGNOSIS — M62838 Other muscle spasm: Secondary | ICD-10-CM | POA: Diagnosis not present

## 2022-03-16 ENCOUNTER — Encounter: Payer: Self-pay | Admitting: Internal Medicine

## 2022-03-21 ENCOUNTER — Encounter: Payer: Self-pay | Admitting: Physical Therapy

## 2022-03-21 ENCOUNTER — Ambulatory Visit: Payer: Medicare Other | Attending: Physical Medicine and Rehabilitation | Admitting: Physical Therapy

## 2022-03-21 DIAGNOSIS — M62830 Muscle spasm of back: Secondary | ICD-10-CM | POA: Diagnosis not present

## 2022-03-21 DIAGNOSIS — M5441 Lumbago with sciatica, right side: Secondary | ICD-10-CM | POA: Insufficient documentation

## 2022-03-21 DIAGNOSIS — M6281 Muscle weakness (generalized): Secondary | ICD-10-CM | POA: Diagnosis not present

## 2022-03-21 DIAGNOSIS — M5416 Radiculopathy, lumbar region: Secondary | ICD-10-CM | POA: Insufficient documentation

## 2022-03-21 DIAGNOSIS — M62838 Other muscle spasm: Secondary | ICD-10-CM | POA: Diagnosis not present

## 2022-03-21 DIAGNOSIS — M5459 Other low back pain: Secondary | ICD-10-CM | POA: Insufficient documentation

## 2022-03-21 NOTE — Therapy (Signed)
OUTPATIENT PHYSICAL THERAPY TREATMENT   Patient Name: Tyrone Diaz MRN: [id number] DOB:08-May-1941, 81 y.o., male Today's Date: 03/21/2022   PT End of Session - 03/21/22 1101     Visit Number 2    Authorization Type Medicare & BCBS    PT Start Time 1101    PT Stop Time 1146    PT Time Calculation (min) 45 min    Activity Tolerance Patient tolerated treatment well    Behavior During Therapy WFL for tasks assessed/performed              Past Medical History:  Diagnosis Date   ANEMIA    ANXIETY    ARTHRITIS, CERVICAL SPINE    BCC (basal cell carcinoma of skin)    Cataracts, bilateral    GERD    GLAUCOMA, BILATERAL    History of stress test 12/2001   Showed normal perfusion with mild apical thinning.   History of stress test 11/2007   Post stress myocardial perfusion images show a normal pattern of perfusion in the all regions, the post stress left ventricular is normal in size. There is no scintigraphic evidence of inducible myocardial ischemia. The post EF 64%   Hx of echocardiogram 12/2001   Had previously shown mild MR as well as mild TR with normal systolic function.   Hx of echocardiogram 11/2006   5-years follow-up, this showed normal systolic as well as diastolic function. There was suggestion of flat mitral valve leaflet coaptation with borderline anterior mitral valve leaflet prolapse with mild associated mitral valve prolapse. He had mild TR. There was very mild aortic valve sclerosis without stenosis or insufficiency.   HYPERLIPIDEMIA    HYPOTHYROIDISM    PROCTITIS    TESTICULAR HYPOFUNCTION    Unspecified vitamin D deficiency    VARICOSE VEINS, LOWER EXTREMITIES    Past Surgical History:  Procedure Laterality Date   BACK SURGERY  2019   per pt, low back surgery   DENTAL SURGERY     ear injury     piece of ear plug dislodged from ear   TONSILECTOMY, ADENOIDECTOMY, BILATERAL MYRINGOTOMY AND TUBES  09/11/1941   WISDOM TOOTH EXTRACTION     Patient  Active Problem List   Diagnosis Date Noted   BCC (basal cell carcinoma of skin) 12/01/2019   History of lumbar laminectomy 10/28/2018   PCP NOTES >>>>>>>>>>>>>>>>> 10/17/2016   Erectile dysfunction 04/13/2015   Hyperglycemia 10/13/2014   GERD (gastroesophageal reflux disease) 10/13/2014   Edema leg 07/02/2014   Palpitations 07/02/2014   Annual physical exam 08/05/2012   h/o goiter, remote Bx 08/05/2012   Allergic rhinitis 12/28/2011   Hypogonadism in male 04/28/2010   Vitamin D deficiency 04/28/2010   Dyslipidemia 04/28/2010   VARICOSE VEINS, LOWER EXTREMITIES 04/28/2010   IBS (irritable bowel syndrome) 04/28/2010   Anxiety state 07/23/2008   GLAUCOMA, BILATERAL 07/23/2008   PROCTITIS- anal pruritus 07/23/2008   Spinal stenosis 07/23/2008    PCP: Colon Branch, MD  REFERRING PROVIDER: Margaretha Sheffield, MD  REFERRING DIAG: [phone] (ICD-10-CM) - Lumbar stenosis  RATIONALE FOR EVALUATION AND TREATMENT: Rehabilitation  THERAPY DIAG:  Other low back pain  Radiculopathy, lumbar region  Muscle spasm of back  Other muscle spasm  Muscle weakness (generalized)  ONSET DATE: ~6 months  SUBJECTIVE:  SUBJECTIVE STATEMENT: Pt reports the HEP going well and the stretches seem to be helping with the stiffness in his back. He still notes the intermittent tingling in the back of his R LE.  PAIN:  Are you having pain? No  PERTINENT HISTORY:  10/28/18 - L2-3, L3-4, L4-5 microscopic decompressive laminectomy; spinal stenosis, skin cancer, GERD, IBS, LE edema, HLD, LE varicose veins, anxiety, B glaucoma  PRECAUTIONS: None  WEIGHT BEARING RESTRICTIONS: No  FALLS:  Has patient fallen in last 6 months? No  LIVING ENVIRONMENT: Lives with: lives with their spouse Lives in: House/apartment Stairs: Yes:  Internal: 14 steps; on right going up, on left going up, and can reach both Has following equipment at home: Gilford Rile - 2 wheeled  OCCUPATION: Retired  PLOF: Independent and Leisure: boating (ski boat); walking daily for ~1/4 mile  PATIENT GOALS: "To be able to drive an RV from [place] to [place] in October."   OBJECTIVE:   DIAGNOSTIC FINDINGS:  Lumbar x-ray 08/17/21: 5 lumbar vertebra. Hip and SI joints well-preserved. There is been a central laminectomy from L2-S1. Level disc degeneration present with no  evidence of anterolisthesis. Lumbar MRI 07/08/21: 1. Interval progression of degenerative changes at L2-3 which in  association with epidural lipomatosis resulting in severe narrowing of the thecal sac, moderate to severe right and severe left neural foraminal narrowing.  2. Postsurgical changes with interval resolution of high-grade spinal canal stenosis at L3-4 and L4-5 with persistent high-grade bilateral neural foraminal narrowing at these levels.  3. Mild right and moderate left neural foraminal narrowing at L1-2.  4. Moderate right and mild left neural foraminal narrowing at L5-S1.   Lumbar x-ray 10/01/18: L1-2, L2-3, L3-4 and L4-5 with DDD and disc narrowing and anterior spurs, no anterolisthesis, the neuroforamen are narrowed at L3-4 and L4-5 on lateral radiographs.  PATIENT SURVEYS:  FOTO Lumbar = 69; predicted = 70  SCREENING FOR RED FLAGS: Bowel or bladder incontinence: No Spinal tumors: No Cauda equina syndrome: No Compression fracture: No Abdominal aneurysm: No  COGNITION:  Overall cognitive status: Within functional limits for tasks assessed     SENSATION: WFL  MUSCLE LENGTH: Hamstrings: mod tight R>L ITB: mild/mod tight R>L Piriformis: mod tight R>L Hip flexors: mod tight R>L Quads: mod tight R>L Heelcord: mod tight B  POSTURE: rounded shoulders, forward head, decreased lumbar lordosis, and decreased thoracic kyphosis  PALPATION: Increased muscle tension in  B lumbar paraspinals and R>L glutes piriformis but denies TTP. Lumbar spine hypomobility with CPAs but no pain reported.  LUMBAR ROM:   Active  AROM  Eval - 03/08/22  Flexion Hands to toes  Extension WFL  Right lateral flexion WFL  Left lateral flexion WFL  Right rotation [facility]  Left rotation WFL   (Blank rows = not tested)  LOWER EXTREMITY MMT:    MMT Right Eval 03/08/22 Left Eval 03/08/22  Hip flexion 4+ 4+  Hip extension 4 4  Hip abduction 4- 4  Hip adduction 4+ 4+  Hip internal rotation 4+ 4+  Hip external rotation 4- 4  Knee flexion 4+ 4+  Knee extension 5 5  Ankle dorsiflexion 4 4  Ankle plantarflexion 5 5  Ankle inversion    Ankle eversion     (Blank rows = not tested)  LUMBAR SPECIAL TESTS:  Straight leg raise test: Negative and FABER test: Negative   TODAY'S TREATMENT   03/21/22 THERAPEUTIC EXERCISE: to improve flexibility, strength and mobility.  Verbal and tactile cues throughout for technique. Rec Bike -  L2 x 6 min Supine R/L HS stretch with strap 2 x 30 sec, 2nd rep with slight pull into DF for calf stretch; cues to keep knee straight Supine R/L ITB stretch cross body with strap 2 x 30 sec R/L Mod thomas hip flexor stretch with strap 2 x 30 sec - cues to avoid raising knee above height of hip Hooklying R/L figure-4 piriformis stretch with light overpressure 2 x 30 sec Hooklying R/L KTOS piriformis stretch 2 x 30 sec TrA isometric abdominal bracing 10 x 5" TrA + PPT 10 x 5" TrA/PPT + RTB bent knee fallouts 10 x 3" TrA/PPT + RTB march 10 x 3" Standing R/L runner's position gastroc stretch 2 x 30 sec   03/08/22 THERAPEUTIC EXERCISE: Instruction in initial HEP (see below) to improve flexibility, strength and mobility.  Verbal and tactile cues throughout for technique.   PATIENT EDUCATION:  Education details: HEP progression - sciatic nerve glide, lumbopelvic strengthening Person educated: Patient Education method: Explanation, Demonstration, Verbal  cues, and Handouts Education comprehension: verbalized understanding, returned demonstration, verbal cues required, and needs further education   HOME EXERCISE PROGRAM: Access Code: [email or web address] URL: [email or web address] Date: 03/21/2022 Prepared by: Annie Paras  Exercises - Supine Quadriceps Stretch with Strap on Table  - 2 x daily - 7 x weekly - 3 reps - 30 sec hold - Supine Hamstring Stretch with Strap  - 2 x daily - 7 x weekly - 3 reps - 30 sec hold - Supine ITB Stretch with Strap  - 2 x daily - 7 x weekly - 3 reps - 30 sec hold - Supine Figure 4 Piriformis Stretch  - 2 x daily - 7 x weekly - 3 reps - 30 sec hold - Supine Piriformis Stretch with Foot on Ground  - 2 x daily - 7 x weekly - 3 reps - 30 sec hold - Gastroc Stretch on Wall  - 2 x daily - 7 x weekly - 3 reps - 30 sec hold - Supine Sciatic Nerve Glide  - 2 x daily - 7 x weekly - 2 sets - 10 reps - 3 sec hold - Supine Posterior Pelvic Tilt  - 2 x daily - 7 x weekly - 2 sets - 10 reps - 5 sec hold - Hooklying Single Leg Bent Knee Fallouts with Resistance  - 1 x daily - 7 x weekly - 2 sets - 10 reps - 3 sec hold - Supine March with Resistance Band  - 1 x daily - 7 x weekly - 2 sets - 10 reps - 2-3 sec hold hold - Bridge with Resistance  - 1 x daily - 7 x weekly - 2 sets - 10 reps - 5 sec hold  ASSESSMENT:  CLINICAL IMPRESSION: Kroy reports the HEP stretches are going well and seem to be helping with the tightness/stiffness in his low back. HEP reviewed with pt able to perform good return demonstration of the stretches with only minor clarification necessary for proper positioning or direction of pull/pressure. Introduced hooklying sciatic nerve glides to reduce restriction in sciatic nerve for reduction in radicular symptoms. Initiated lumbopelvic strengthening targeting core/abdominal muscle engagement with proximal LE strengthening - all exercises well tolerated and HEP updated to reflect exercise  progression.  OBJECTIVE IMPAIRMENTS decreased activity tolerance, decreased endurance, decreased knowledge of condition, decreased mobility, difficulty walking, decreased strength, increased fascial restrictions, impaired perceived functional ability, increased muscle spasms, impaired flexibility, improper body mechanics, postural dysfunction, and pain.   ACTIVITY LIMITATIONS sitting  PARTICIPATION  LIMITATIONS: driving  PERSONAL FACTORS Age, Fitness, Past/current experiences, Time since onset of injury/illness/exacerbation, and 3+ comorbidities: L2-3, L3-4, L4-5 microscopic decompressive laminectomy; spinal stenosis, skin cancer, GERD, IBS, LE edema, HLD, LE varicose veins, anxiety, B glaucoma  are also affecting patient's functional outcome.   REHAB POTENTIAL: Excellent  CLINICAL DECISION MAKING: Stable/uncomplicated  EVALUATION COMPLEXITY: Low   GOALS: Goals reviewed with patient? Yes  SHORT TERM GOALS: Target date: 03/29/2022   Patient will be independent with initial HEP.  Baseline:  Goal status: IN PROGRESS  LONG TERM GOALS: Target date: 04/19/2022    Patient will be independent with advanced/ongoing HEP to improve outcomes and carryover.  Baseline:  Goal status: IN PROGRESS  2.  Patient will report 75% improvement in low back pain and R LE radicular pain to improve QOL.  Baseline:  Goal status: IN PROGRESS  3.  Patient to demonstrate ability to achieve and maintain good spinal alignment/posturing and body mechanics needed for daily activities. Baseline:  Goal status: IN PROGRESS  4.  Patient will demonstrate improved B LE strength to >/= 4+ to 5/5 for improved stability and ease of mobility . Baseline: Refer to above MMT chart Goal status: IN PROGRESS  5.  Patient will report 78 on lumbar FOTO to demonstrate improved functional ability.  Baseline: 69 Goal status: IN PROGRESS   6.  Patient will tolerate >/= 1.5 hrs of sitting while driving to improve tolerance for  travel.  Baseline: 30-45 minute before severe pain causing him to have to stop driving  Goal status: IN PROGRESS  7.  Patient will report ability to walk w/o noting R foot slap.  Baseline:  Goal status: IN PROGRESS  PLAN: PT FREQUENCY: 2x/week  PT DURATION: 6 weeks  PLANNED INTERVENTIONS: Therapeutic exercises, Therapeutic activity, Neuromuscular re-education, Balance training, Gait training, Patient/Family education, Joint mobilization, Dry Needling, Electrical stimulation, Spinal mobilization, Cryotherapy, Moist heat, Taping, Traction, Ultrasound, Ionotophoresis '4mg'$ /ml Dexamethasone, Manual therapy, and Re-evaluation.  PLAN FOR NEXT SESSION: progress lumbopelvic flexibility and strengthening - review and update HEP as indicated; MT +/- DN to address increased muscle tension/tightness in lumbar paraspinals and glutes/piriformis   Percival Spanish, PT 03/21/2022, 11:54 AM

## 2022-03-23 ENCOUNTER — Ambulatory Visit: Payer: Medicare Other

## 2022-03-23 DIAGNOSIS — M6281 Muscle weakness (generalized): Secondary | ICD-10-CM

## 2022-03-23 DIAGNOSIS — M5441 Lumbago with sciatica, right side: Secondary | ICD-10-CM | POA: Diagnosis not present

## 2022-03-23 DIAGNOSIS — M5416 Radiculopathy, lumbar region: Secondary | ICD-10-CM

## 2022-03-23 DIAGNOSIS — M62830 Muscle spasm of back: Secondary | ICD-10-CM

## 2022-03-23 DIAGNOSIS — M62838 Other muscle spasm: Secondary | ICD-10-CM | POA: Diagnosis not present

## 2022-03-23 DIAGNOSIS — M5459 Other low back pain: Secondary | ICD-10-CM | POA: Diagnosis not present

## 2022-03-23 NOTE — Therapy (Signed)
OUTPATIENT PHYSICAL THERAPY TREATMENT   Patient Name: Tyrone Diaz MRN: [id number] DOB:07-Jun-1941, 81 y.o., male Today's Date: 03/23/2022   PT End of Session - 03/23/22 1121     Visit Number 3    Authorization Type Medicare & BCBS    PT Start Time 5176    PT Stop Time 1110    PT Time Calculation (min) 48 min    Activity Tolerance Patient tolerated treatment well    Behavior During Therapy WFL for tasks assessed/performed               Past Medical History:  Diagnosis Date   ANEMIA    ANXIETY    ARTHRITIS, CERVICAL SPINE    BCC (basal cell carcinoma of skin)    Cataracts, bilateral    GERD    GLAUCOMA, BILATERAL    History of stress test 12/2001   Showed normal perfusion with mild apical thinning.   History of stress test 11/2007   Post stress myocardial perfusion images show a normal pattern of perfusion in the all regions, the post stress left ventricular is normal in size. There is no scintigraphic evidence of inducible myocardial ischemia. The post EF 64%   Hx of echocardiogram 12/2001   Had previously shown mild MR as well as mild TR with normal systolic function.   Hx of echocardiogram 11/2006   5-years follow-up, this showed normal systolic as well as diastolic function. There was suggestion of flat mitral valve leaflet coaptation with borderline anterior mitral valve leaflet prolapse with mild associated mitral valve prolapse. He had mild TR. There was very mild aortic valve sclerosis without stenosis or insufficiency.   HYPERLIPIDEMIA    HYPOTHYROIDISM    PROCTITIS    TESTICULAR HYPOFUNCTION    Unspecified vitamin D deficiency    VARICOSE VEINS, LOWER EXTREMITIES    Past Surgical History:  Procedure Laterality Date   BACK SURGERY  2019   per pt, low back surgery   DENTAL SURGERY     ear injury     piece of ear plug dislodged from ear   TONSILECTOMY, ADENOIDECTOMY, BILATERAL MYRINGOTOMY AND TUBES  09[birth year]1941   WISDOM TOOTH EXTRACTION     Patient  Active Problem List   Diagnosis Date Noted   BCC (basal cell carcinoma of skin) 12/01/2019   History of lumbar laminectomy 10/28/2018   PCP NOTES >>>>>>>>>>>>>>>>> 10/17/2016   Erectile dysfunction 04/13/2015   Hyperglycemia 10/13/2014   GERD (gastroesophageal reflux disease) 10/13/2014   Edema leg 07/02/2014   Palpitations 07/02/2014   Annual physical exam 08/05/2012   h/o goiter, remote Bx 08/05/2012   Allergic rhinitis 12/28/2011   Hypogonadism in male 04/28/2010   Vitamin D deficiency 04/28/2010   Dyslipidemia 04/28/2010   VARICOSE VEINS, LOWER EXTREMITIES 04/28/2010   IBS (irritable bowel syndrome) 04/28/2010   Anxiety state 07/23/2008   GLAUCOMA, BILATERAL 07/23/2008   PROCTITIS- anal pruritus 07/23/2008   Spinal stenosis 07/23/2008    PCP: Colon Branch, MD  REFERRING PROVIDER: Margaretha Sheffield, MD  REFERRING DIAG: [phone] (ICD-10-CM) - Lumbar stenosis  RATIONALE FOR EVALUATION AND TREATMENT: Rehabilitation  THERAPY DIAG:  Other low back pain  Radiculopathy, lumbar region  Muscle spasm of back  Other muscle spasm  Muscle weakness (generalized)  ONSET DATE: ~6 months  SUBJECTIVE:  SUBJECTIVE STATEMENT: Doing good, still stiff in lower back and glutes.  PAIN:  Are you having pain? No  PERTINENT HISTORY:  10/28/18 - L2-3, L3-4, L4-5 microscopic decompressive laminectomy; spinal stenosis, skin cancer, GERD, IBS, LE edema, HLD, LE varicose veins, anxiety, B glaucoma  PRECAUTIONS: None  WEIGHT BEARING RESTRICTIONS: No  FALLS:  Has patient fallen in last 6 months? No  LIVING ENVIRONMENT: Lives with: lives with their spouse Lives in: House/apartment Stairs: Yes: Internal: 14 steps; on right going up, on left going up, and can reach both Has following equipment at home:  Gilford Rile - 2 wheeled  OCCUPATION: Retired  PLOF: Independent and Leisure: boating (ski boat); walking daily for ~1/4 mile  PATIENT GOALS: "To be able to drive an RV from [place] to [place] in October."   OBJECTIVE:   DIAGNOSTIC FINDINGS:  Lumbar x-ray 08/17/21: 5 lumbar vertebra. Hip and SI joints well-preserved. There is been a central laminectomy from L2-S1. Level disc degeneration present with no  evidence of anterolisthesis. Lumbar MRI 07/08/21: 1. Interval progression of degenerative changes at L2-3 which in  association with epidural lipomatosis resulting in severe narrowing of the thecal sac, moderate to severe right and severe left neural foraminal narrowing.  2. Postsurgical changes with interval resolution of high-grade spinal canal stenosis at L3-4 and L4-5 with persistent high-grade bilateral neural foraminal narrowing at these levels.  3. Mild right and moderate left neural foraminal narrowing at L1-2.  4. Moderate right and mild left neural foraminal narrowing at L5-S1.   Lumbar x-ray 10/01/18: L1-2, L2-3, L3-4 and L4-5 with DDD and disc narrowing and anterior spurs, no anterolisthesis, the neuroforamen are narrowed at L3-4 and L4-5 on lateral radiographs.  PATIENT SURVEYS:  FOTO Lumbar = 69; predicted = 70  SCREENING FOR RED FLAGS: Bowel or bladder incontinence: No Spinal tumors: No Cauda equina syndrome: No Compression fracture: No Abdominal aneurysm: No  COGNITION:  Overall cognitive status: Within functional limits for tasks assessed     SENSATION: WFL  MUSCLE LENGTH: Hamstrings: mod tight R>L ITB: mild/mod tight R>L Piriformis: mod tight R>L Hip flexors: mod tight R>L Quads: mod tight R>L Heelcord: mod tight B  POSTURE: rounded shoulders, forward head, decreased lumbar lordosis, and decreased thoracic kyphosis  PALPATION: Increased muscle tension in B lumbar paraspinals and R>L glutes piriformis but denies TTP. Lumbar spine hypomobility with CPAs but no  pain reported.  LUMBAR ROM:   Active  AROM  Eval - 03/08/22  Flexion Hands to toes  Extension WFL  Right lateral flexion WFL  Left lateral flexion WFL  Right rotation [facility]  Left rotation WFL   (Blank rows = not tested)  LOWER EXTREMITY MMT:    MMT Right Eval 03/08/22 Left Eval 03/08/22  Hip flexion 4+ 4+  Hip extension 4 4  Hip abduction 4- 4  Hip adduction 4+ 4+  Hip internal rotation 4+ 4+  Hip external rotation 4- 4  Knee flexion 4+ 4+  Knee extension 5 5  Ankle dorsiflexion 4 4  Ankle plantarflexion 5 5  Ankle inversion    Ankle eversion     (Blank rows = not tested)  LUMBAR SPECIAL TESTS:  Straight leg raise test: Negative and FABER test: Negative   TODAY'S TREATMENT  03/23/22 Therapeutic Exercise: Rec Bike L2x59mn Supine hamstring stretch 2x30" with strap Bridges with TrA brace x 10 S/L clams 10x5" hold Bridges with GTB x 20 reps Supine R KTOS stretch x 30 sec  Manual Therapy: STM to R glutes and lumbar  paraspinals, palpable trigger point found in piriformis  03/21/22 THERAPEUTIC EXERCISE: to improve flexibility, strength and mobility.  Verbal and tactile cues throughout for technique. Rec Bike - L2 x 6 min Supine R/L HS stretch with strap 2 x 30 sec, 2nd rep with slight pull into DF for calf stretch; cues to keep knee straight Supine R/L ITB stretch cross body with strap 2 x 30 sec R/L Mod thomas hip flexor stretch with strap 2 x 30 sec - cues to avoid raising knee above height of hip Hooklying R/L figure-4 piriformis stretch with light overpressure 2 x 30 sec Hooklying R/L KTOS piriformis stretch 2 x 30 sec TrA isometric abdominal bracing 10 x 5" TrA + PPT 10 x 5" TrA/PPT + RTB bent knee fallouts 10 x 3" TrA/PPT + RTB march 10 x 3" Standing R/L runner's position gastroc stretch 2 x 30 sec   03/08/22 THERAPEUTIC EXERCISE: Instruction in initial HEP (see below) to improve flexibility, strength and mobility.  Verbal and tactile cues throughout for  technique.   PATIENT EDUCATION:  Education details: HEP progression - sciatic nerve glide, lumbopelvic strengthening Person educated: Patient Education method: Explanation, Demonstration, Verbal cues, and Handouts Education comprehension: verbalized understanding, returned demonstration, verbal cues required, and needs further education   HOME EXERCISE PROGRAM: Access Code: [email or web address] URL: [email or web address] Date: 03/21/2022 Prepared by: Annie Paras  Exercises - Supine Quadriceps Stretch with Strap on Table  - 2 x daily - 7 x weekly - 3 reps - 30 sec hold - Supine Hamstring Stretch with Strap  - 2 x daily - 7 x weekly - 3 reps - 30 sec hold - Supine ITB Stretch with Strap  - 2 x daily - 7 x weekly - 3 reps - 30 sec hold - Supine Figure 4 Piriformis Stretch  - 2 x daily - 7 x weekly - 3 reps - 30 sec hold - Supine Piriformis Stretch with Foot on Ground  - 2 x daily - 7 x weekly - 3 reps - 30 sec hold - Gastroc Stretch on Wall  - 2 x daily - 7 x weekly - 3 reps - 30 sec hold - Supine Sciatic Nerve Glide  - 2 x daily - 7 x weekly - 2 sets - 10 reps - 3 sec hold - Supine Posterior Pelvic Tilt  - 2 x daily - 7 x weekly - 2 sets - 10 reps - 5 sec hold - Hooklying Single Leg Bent Knee Fallouts with Resistance  - 1 x daily - 7 x weekly - 2 sets - 10 reps - 3 sec hold - Supine March with Resistance Band  - 1 x daily - 7 x weekly - 2 sets - 10 reps - 2-3 sec hold hold - Bridge with Resistance  - 1 x daily - 7 x weekly - 2 sets - 10 reps - 5 sec hold  ASSESSMENT:  CLINICAL IMPRESSION: Mr. Andrada was doing well throughout the session. We continued working on lumbopelvic stability and hip strengthening. He denied requiring any updates to HEP at this time. Palpable trigger points were found in R piriformis and muscle tension throughout the gluteals and paraspinals. He did express interest in DN and he shows to be good candidate to benefit from it.  OBJECTIVE IMPAIRMENTS decreased  activity tolerance, decreased endurance, decreased knowledge of condition, decreased mobility, difficulty walking, decreased strength, increased fascial restrictions, impaired perceived functional ability, increased muscle spasms, impaired flexibility, improper body mechanics, postural dysfunction, and pain.   ACTIVITY  LIMITATIONS sitting  PARTICIPATION LIMITATIONS: driving  PERSONAL FACTORS Age, Fitness, Past/current experiences, Time since onset of injury/illness/exacerbation, and 3+ comorbidities: L2-3, L3-4, L4-5 microscopic decompressive laminectomy; spinal stenosis, skin cancer, GERD, IBS, LE edema, HLD, LE varicose veins, anxiety, B glaucoma  are also affecting patient's functional outcome.   REHAB POTENTIAL: Excellent  CLINICAL DECISION MAKING: Stable/uncomplicated  EVALUATION COMPLEXITY: Low   GOALS: Goals reviewed with patient? Yes  SHORT TERM GOALS: Target date: 03/29/2022   Patient Tyrone be independent with initial HEP.  Baseline:  Goal status: IN PROGRESS  LONG TERM GOALS: Target date: 04/19/2022    Patient Tyrone be independent with advanced/ongoing HEP to improve outcomes and carryover.  Baseline:  Goal status: IN PROGRESS  2.  Patient Tyrone report 75% improvement in low back pain and R LE radicular pain to improve QOL.  Baseline:  Goal status: IN PROGRESS  3.  Patient to demonstrate ability to achieve and maintain good spinal alignment/posturing and body mechanics needed for daily activities. Baseline:  Goal status: IN PROGRESS  4.  Patient Tyrone demonstrate improved B LE strength to >/= 4+ to 5/5 for improved stability and ease of mobility . Baseline: Refer to above MMT chart Goal status: IN PROGRESS  5.  Patient Tyrone report 22 on lumbar FOTO to demonstrate improved functional ability.  Baseline: 69 Goal status: IN PROGRESS   6.  Patient Tyrone tolerate >/= 1.5 hrs of sitting while driving to improve tolerance for travel.  Baseline: 30-45 minute before severe  pain causing him to have to stop driving  Goal status: IN PROGRESS  7.  Patient Tyrone report ability to walk w/o noting R foot slap.  Baseline:  Goal status: IN PROGRESS  PLAN: PT FREQUENCY: 2x/week  PT DURATION: 6 weeks  PLANNED INTERVENTIONS: Therapeutic exercises, Therapeutic activity, Neuromuscular re-education, Balance training, Gait training, Patient/Family education, Joint mobilization, Dry Needling, Electrical stimulation, Spinal mobilization, Cryotherapy, Moist heat, Taping, Traction, Ultrasound, Ionotophoresis '4mg'$ /ml Dexamethasone, Manual therapy, and Re-evaluation.  PLAN FOR NEXT SESSION: progress lumbopelvic flexibility and strengthening - review and update HEP as indicated; MT +/- DN to address increased muscle tension/tightness in lumbar paraspinals and glutes/piriformis   Artist Pais, PTA 03/23/2022, 11:23 AM

## 2022-03-27 DIAGNOSIS — M4807 Spinal stenosis, lumbosacral region: Secondary | ICD-10-CM | POA: Diagnosis not present

## 2022-03-27 DIAGNOSIS — M62830 Muscle spasm of back: Secondary | ICD-10-CM | POA: Diagnosis not present

## 2022-03-27 DIAGNOSIS — G4700 Insomnia, unspecified: Secondary | ICD-10-CM | POA: Diagnosis not present

## 2022-03-27 DIAGNOSIS — G894 Chronic pain syndrome: Secondary | ICD-10-CM | POA: Diagnosis not present

## 2022-03-28 ENCOUNTER — Ambulatory Visit: Payer: Medicare Other | Admitting: Physical Therapy

## 2022-03-28 ENCOUNTER — Encounter: Payer: Self-pay | Admitting: Physical Therapy

## 2022-03-28 DIAGNOSIS — M5441 Lumbago with sciatica, right side: Secondary | ICD-10-CM | POA: Diagnosis not present

## 2022-03-28 DIAGNOSIS — M5459 Other low back pain: Secondary | ICD-10-CM | POA: Diagnosis not present

## 2022-03-28 DIAGNOSIS — M6281 Muscle weakness (generalized): Secondary | ICD-10-CM | POA: Diagnosis not present

## 2022-03-28 DIAGNOSIS — M62830 Muscle spasm of back: Secondary | ICD-10-CM | POA: Diagnosis not present

## 2022-03-28 DIAGNOSIS — M62838 Other muscle spasm: Secondary | ICD-10-CM | POA: Diagnosis not present

## 2022-03-28 DIAGNOSIS — M5416 Radiculopathy, lumbar region: Secondary | ICD-10-CM | POA: Diagnosis not present

## 2022-03-28 NOTE — Therapy (Signed)
OUTPATIENT PHYSICAL THERAPY TREATMENT   Patient Name: COYT GOVONI MRN: [id number] DOB:Sep 24, 1940, 81 y.o., male Today's Date: 03/28/2022   PT End of Session - 03/28/22 1102     Visit Number 4    Authorization Type Medicare & BCBS    PT Start Time 7289    PT Stop Time 1144    PT Time Calculation (min) 42 min    Activity Tolerance Patient tolerated treatment well    Behavior During Therapy WFL for tasks assessed/performed                Past Medical History:  Diagnosis Date   ANEMIA    ANXIETY    ARTHRITIS, CERVICAL SPINE    BCC (basal cell carcinoma of skin)    Cataracts, bilateral    GERD    GLAUCOMA, BILATERAL    History of stress test 12/2001   Showed normal perfusion with mild apical thinning.   History of stress test 11/2007   Post stress myocardial perfusion images show a normal pattern of perfusion in the all regions, the post stress left ventricular is normal in size. There is no scintigraphic evidence of inducible myocardial ischemia. The post EF 64%   Hx of echocardiogram 12/2001   Had previously shown mild MR as well as mild TR with normal systolic function.   Hx of echocardiogram 11/2006   5-years follow-up, this showed normal systolic as well as diastolic function. There was suggestion of flat mitral valve leaflet coaptation with borderline anterior mitral valve leaflet prolapse with mild associated mitral valve prolapse. He had mild TR. There was very mild aortic valve sclerosis without stenosis or insufficiency.   HYPERLIPIDEMIA    HYPOTHYROIDISM    PROCTITIS    TESTICULAR HYPOFUNCTION    Unspecified vitamin D deficiency    VARICOSE VEINS, LOWER EXTREMITIES    Past Surgical History:  Procedure Laterality Date   BACK SURGERY  2019   per pt, low back surgery   DENTAL SURGERY     ear injury     piece of ear plug dislodged from ear   TONSILECTOMY, ADENOIDECTOMY, BILATERAL MYRINGOTOMY AND TUBES  09/11/1941   WISDOM TOOTH EXTRACTION     Patient  Active Problem List   Diagnosis Date Noted   BCC (basal cell carcinoma of skin) 12/01/2019   History of lumbar laminectomy 10/28/2018   PCP NOTES >>>>>>>>>>>>>>>>> 10/17/2016   Erectile dysfunction 04/13/2015   Hyperglycemia 10/13/2014   GERD (gastroesophageal reflux disease) 10/13/2014   Edema leg 07/02/2014   Palpitations 07/02/2014   Annual physical exam 08/05/2012   h/o goiter, remote Bx 08/05/2012   Allergic rhinitis 12/28/2011   Hypogonadism in male 04/28/2010   Vitamin D deficiency 04/28/2010   Dyslipidemia 04/28/2010   VARICOSE VEINS, LOWER EXTREMITIES 04/28/2010   IBS (irritable bowel syndrome) 04/28/2010   Anxiety state 07/23/2008   GLAUCOMA, BILATERAL 07/23/2008   PROCTITIS- anal pruritus 07/23/2008   Spinal stenosis 07/23/2008    PCP: Colon Branch, MD  REFERRING PROVIDER: Margaretha Sheffield, MD  REFERRING DIAG: [phone] (ICD-10-CM) - Lumbar stenosis  RATIONALE FOR EVALUATION AND TREATMENT: Rehabilitation  THERAPY DIAG:  Other low back pain  Radiculopathy, lumbar region  Muscle spasm of back  Other muscle spasm  Muscle weakness (generalized)  ONSET DATE: ~6 months  SUBJECTIVE:  SUBJECTIVE STATEMENT: Pt denies pain today and reports HEP going well.  PAIN:  Are you having pain? No  PERTINENT HISTORY:  10/28/18 - L2-3, L3-4, L4-5 microscopic decompressive laminectomy; spinal stenosis, skin cancer, GERD, IBS, LE edema, HLD, LE varicose veins, anxiety, B glaucoma  PRECAUTIONS: None  WEIGHT BEARING RESTRICTIONS: No  FALLS:  Has patient fallen in last 6 months? No  LIVING ENVIRONMENT: Lives with: lives with their spouse Lives in: House/apartment Stairs: Yes: Internal: 14 steps; on right going up, on left going up, and can reach both Has following equipment at home:  Gilford Rile - 2 wheeled  OCCUPATION: Retired  PLOF: Independent and Leisure: boating (ski boat); walking daily for ~1/4 mile  PATIENT GOALS: "To be able to drive an RV from [place] to [place] in October."   OBJECTIVE:   DIAGNOSTIC FINDINGS:  Lumbar x-ray 08/17/21: 5 lumbar vertebra. Hip and SI joints well-preserved. There is been a central laminectomy from L2-S1. Level disc degeneration present with no  evidence of anterolisthesis. Lumbar MRI 07/08/21: 1. Interval progression of degenerative changes at L2-3 which in  association with epidural lipomatosis resulting in severe narrowing of the thecal sac, moderate to severe right and severe left neural foraminal narrowing.  2. Postsurgical changes with interval resolution of high-grade spinal canal stenosis at L3-4 and L4-5 with persistent high-grade bilateral neural foraminal narrowing at these levels.  3. Mild right and moderate left neural foraminal narrowing at L1-2.  4. Moderate right and mild left neural foraminal narrowing at L5-S1.   Lumbar x-ray 10/01/18: L1-2, L2-3, L3-4 and L4-5 with DDD and disc narrowing and anterior spurs, no anterolisthesis, the neuroforamen are narrowed at L3-4 and L4-5 on lateral radiographs.  PATIENT SURVEYS:  FOTO Lumbar = 69; predicted = 70  SCREENING FOR RED FLAGS: Bowel or bladder incontinence: No Spinal tumors: No Cauda equina syndrome: No Compression fracture: No Abdominal aneurysm: No  COGNITION:  Overall cognitive status: Within functional limits for tasks assessed     SENSATION: WFL  MUSCLE LENGTH: Hamstrings: mod tight R>L ITB: mild/mod tight R>L Piriformis: mod tight R>L Hip flexors: mod tight R>L Quads: mod tight R>L Heelcord: mod tight B  POSTURE: rounded shoulders, forward head, decreased lumbar lordosis, and decreased thoracic kyphosis  PALPATION: Increased muscle tension in B lumbar paraspinals and R>L glutes piriformis but denies TTP. Lumbar spine hypomobility with CPAs but no  pain reported.  LUMBAR ROM:   Active  AROM  Eval - 03/08/22  Flexion Hands to toes  Extension WFL  Right lateral flexion WFL  Left lateral flexion WFL  Right rotation [facility]  Left rotation WFL   (Blank rows = not tested)  LOWER EXTREMITY MMT:    MMT Right Eval 03/08/22 Left Eval 03/08/22  Hip flexion 4+ 4+  Hip extension 4 4  Hip abduction 4- 4  Hip adduction 4+ 4+  Hip internal rotation 4+ 4+  Hip external rotation 4- 4  Knee flexion 4+ 4+  Knee extension 5 5  Ankle dorsiflexion 4 4  Ankle plantarflexion 5 5  Ankle inversion    Ankle eversion     (Blank rows = not tested)  LUMBAR SPECIAL TESTS:  Straight leg raise test: Negative and FABER test: Negative   TODAY'S TREATMENT   03/21/22 THERAPEUTIC EXERCISE: to improve flexibility, strength and mobility.  Verbal and tactile cues throughout for technique. Rec Bike - L3 x 7 min B heel raises with counter support 10 x 3", 2 sets Counter squat 10 x 5", 2 sets Hooklying  R figure-4 piriformis stretch with light overpressure 2 x 30 sec Hooklying R KTOS piriformis stretch 2 x 30 sec  MANUAL THERAPY: To promote normalized muscle tension, improved flexibility, and reduced pain. Skilled palpation and monitoring of soft tissue during DN Trigger Point Dry-Needling  Treatment instructions: Expect mild to moderate muscle soreness. Patient verbalized understanding of these instructions and education. Patient Consent Given: Yes Education handout provided: Yes Muscles treated: R glute medius and minimus, medial and lateral piriforimis Electrical stimulation performed: No Parameters: N/A Treatment response/outcome: Twitch Response Elicited and Palpable Increase in Muscle Length STM/DTM and pin & stretch to muscles addressed with DN   03/23/22 Therapeutic Exercise: Rec Bike L2x50mn Supine hamstring stretch 2x30" with strap Bridges with TrA brace x 10 S/L clams 10x5" hold Bridges with GTB x 20 reps Supine R KTOS stretch x 30  sec  Manual Therapy: STM to R glutes and lumbar paraspinals, palpable trigger point found in piriformis   03/21/22 THERAPEUTIC EXERCISE: to improve flexibility, strength and mobility.  Verbal and tactile cues throughout for technique. Rec Bike - L2 x 6 min Supine R/L HS stretch with strap 2 x 30 sec, 2nd rep with slight pull into DF for calf stretch; cues to keep knee straight Supine R/L ITB stretch cross body with strap 2 x 30 sec R/L Mod thomas hip flexor stretch with strap 2 x 30 sec - cues to avoid raising knee above height of hip Hooklying R/L figure-4 piriformis stretch with light overpressure 2 x 30 sec Hooklying R/L KTOS piriformis stretch 2 x 30 sec TrA isometric abdominal bracing 10 x 5" TrA + PPT 10 x 5" TrA/PPT + RTB bent knee fallouts 10 x 3" TrA/PPT + RTB march 10 x 3" Standing R/L runner's position gastroc stretch 2 x 30 sec   PATIENT EDUCATION:  Education details: role of DN and DN rational, procedure, outcomes, potential side effects, and recommended post-treatment exercises/activity Person educated: Patient Education method: Explanation and Handouts Education comprehension: verbalized understanding   HOME EXERCISE PROGRAM: Access Code: [email or web address]: [email or web address] Date: 03/28/2022 Prepared by: JAnnie Paras Exercises - Supine Quadriceps Stretch with Strap on Table  - 2 x daily - 7 x weekly - 3 reps - 30 sec hold - Supine Hamstring Stretch with Strap  - 2 x daily - 7 x weekly - 3 reps - 30 sec hold - Supine ITB Stretch with Strap  - 2 x daily - 7 x weekly - 3 reps - 30 sec hold - Supine Figure 4 Piriformis Stretch  - 2 x daily - 7 x weekly - 3 reps - 30 sec hold - Supine Piriformis Stretch with Foot on Ground  - 2 x daily - 7 x weekly - 3 reps - 30 sec hold - Gastroc Stretch on Wall  - 2 x daily - 7 x weekly - 3 reps - 30 sec hold - Supine Sciatic Nerve Glide  - 2 x daily - 7 x weekly - 2 sets - 10 reps - 3 sec hold - Supine Posterior  Pelvic Tilt  - 2 x daily - 7 x weekly - 2 sets - 10 reps - 5 sec hold - Hooklying Single Leg Bent Knee Fallouts with Resistance  - 1 x daily - 7 x weekly - 2 sets - 10 reps - 3 sec hold - Supine March with Resistance Band  - 1 x daily - 7 x weekly - 2 sets - 10 reps - 2-3 sec hold hold - Bridge with  Resistance  - 1 x daily - 7 x weekly - 2 sets - 10 reps - 5 sec hold  Patient Education - Trigger Point Dry Needling  ASSESSMENT:  CLINICAL IMPRESSION: Natnael reports HEP going well with benefit noted from the stretches and he denies any concerns or need for further review - STG met. Progressed strengthening to more upright activities while continuing to target core muscle engagement and proximal LE strengthening. Pt demonstrating good pacing but cues necessary to increase hold times. He notes continue tight, tender area in his R buttock with palpation revealing taut bands and TPs in R glutes and piriformis. After explanation of DN rational, procedures, outcomes and potential side effects, patient verbalized consent to DN treatment in conjunction with manual STM/DTM and TPR to reduce ttp/muscle tension. Muscles treated as indicated above. DN produced normal response with good twitches elicited resulting in palpable reduction in pain/ttp and muscle tension. MT/DN followed by stretching to reinforce normalization of muscle tension. Pt educated to expect mild to moderate muscle soreness for up to 24-48 hrs and instructed to continue prescribed home exercise program and current activity level with pt verbalizing understanding of theses instructions.   OBJECTIVE IMPAIRMENTS decreased activity tolerance, decreased endurance, decreased knowledge of condition, decreased mobility, difficulty walking, decreased strength, increased fascial restrictions, impaired perceived functional ability, increased muscle spasms, impaired flexibility, improper body mechanics, postural dysfunction, and pain.   ACTIVITY LIMITATIONS  sitting  PARTICIPATION LIMITATIONS: driving  PERSONAL FACTORS Age, Fitness, Past/current experiences, Time since onset of injury/illness/exacerbation, and 3+ comorbidities: L2-3, L3-4, L4-5 microscopic decompressive laminectomy; spinal stenosis, skin cancer, GERD, IBS, LE edema, HLD, LE varicose veins, anxiety, B glaucoma  are also affecting patient's functional outcome.   REHAB POTENTIAL: Excellent  CLINICAL DECISION MAKING: Stable/uncomplicated  EVALUATION COMPLEXITY: Low   GOALS: Goals reviewed with patient? Yes  SHORT TERM GOALS: Target date: 03/29/2022   Patient will be independent with initial HEP.  Baseline:  Goal status: MET  03/28/22  LONG TERM GOALS: Target date: 04/19/2022    Patient will be independent with advanced/ongoing HEP to improve outcomes and carryover.  Baseline:  Goal status: IN PROGRESS  2.  Patient will report 75% improvement in low back pain and R LE radicular pain to improve QOL.  Baseline:  Goal status: IN PROGRESS  3.  Patient to demonstrate ability to achieve and maintain good spinal alignment/posturing and body mechanics needed for daily activities. Baseline:  Goal status: IN PROGRESS  4.  Patient will demonstrate improved B LE strength to >/= 4+ to 5/5 for improved stability and ease of mobility . Baseline: Refer to above MMT chart Goal status: IN PROGRESS  5.  Patient will report 56 on lumbar FOTO to demonstrate improved functional ability.  Baseline: 69 Goal status: IN PROGRESS   6.  Patient will tolerate >/= 1.5 hrs of sitting while driving to improve tolerance for travel.  Baseline: 30-45 minute before severe pain causing him to have to stop driving  Goal status: IN PROGRESS  7.  Patient will report ability to walk w/o noting R foot slap.  Baseline:  Goal status: IN PROGRESS  PLAN: PT FREQUENCY: 2x/week  PT DURATION: 6 weeks  PLANNED INTERVENTIONS: Therapeutic exercises, Therapeutic activity, Neuromuscular re-education, Balance  training, Gait training, Patient/Family education, Joint mobilization, Dry Needling, Electrical stimulation, Spinal mobilization, Cryotherapy, Moist heat, Taping, Traction, Ultrasound, Ionotophoresis 45m/ml Dexamethasone, Manual therapy, and Re-evaluation.  PLAN FOR NEXT SESSION: assess response to DN; progress lumbopelvic flexibility and strengthening - review and update HEP  as indicated; MT +/- DN to address increased muscle tension/tightness in lumbar paraspinals and glutes/piriformis   Percival Spanish, PT 03/28/2022, 11:56 AM

## 2022-03-31 ENCOUNTER — Ambulatory Visit: Payer: Medicare Other

## 2022-03-31 DIAGNOSIS — M62830 Muscle spasm of back: Secondary | ICD-10-CM

## 2022-03-31 DIAGNOSIS — M6281 Muscle weakness (generalized): Secondary | ICD-10-CM

## 2022-03-31 DIAGNOSIS — M5459 Other low back pain: Secondary | ICD-10-CM

## 2022-03-31 DIAGNOSIS — M62838 Other muscle spasm: Secondary | ICD-10-CM

## 2022-03-31 DIAGNOSIS — M5416 Radiculopathy, lumbar region: Secondary | ICD-10-CM | POA: Diagnosis not present

## 2022-03-31 DIAGNOSIS — M5441 Lumbago with sciatica, right side: Secondary | ICD-10-CM | POA: Diagnosis not present

## 2022-03-31 NOTE — Therapy (Signed)
OUTPATIENT PHYSICAL THERAPY TREATMENT   Patient Name: Tyrone Diaz MRN: [id number] DOB:27-Jul-1941, 81 y.o., male Today's Date: 03/31/2022   PT End of Session - 03/31/22 1112     Visit Number 5    Authorization Type Medicare & BCBS    PT Start Time 1019    PT Stop Time 1101    PT Time Calculation (min) 42 min    Activity Tolerance Patient tolerated treatment well    Behavior During Therapy WFL for tasks assessed/performed                 Past Medical History:  Diagnosis Date   ANEMIA    ANXIETY    ARTHRITIS, CERVICAL SPINE    BCC (basal cell carcinoma of skin)    Cataracts, bilateral    GERD    GLAUCOMA, BILATERAL    History of stress test 12/2001   Showed normal perfusion with mild apical thinning.   History of stress test 11/2007   Post stress myocardial perfusion images show a normal pattern of perfusion in the all regions, the post stress left ventricular is normal in size. There is no scintigraphic evidence of inducible myocardial ischemia. The post EF 64%   Hx of echocardiogram 12/2001   Had previously shown mild MR as well as mild TR with normal systolic function.   Hx of echocardiogram 11/2006   5-years follow-up, this showed normal systolic as well as diastolic function. There was suggestion of flat mitral valve leaflet coaptation with borderline anterior mitral valve leaflet prolapse with mild associated mitral valve prolapse. He had mild TR. There was very mild aortic valve sclerosis without stenosis or insufficiency.   HYPERLIPIDEMIA    HYPOTHYROIDISM    PROCTITIS    TESTICULAR HYPOFUNCTION    Unspecified vitamin D deficiency    VARICOSE VEINS, LOWER EXTREMITIES    Past Surgical History:  Procedure Laterality Date   BACK SURGERY  2019   per pt, low back surgery   DENTAL SURGERY     ear injury     piece of ear plug dislodged from ear   TONSILECTOMY, ADENOIDECTOMY, BILATERAL MYRINGOTOMY AND TUBES  09/11/1941   WISDOM TOOTH EXTRACTION      Patient Active Problem List   Diagnosis Date Noted   BCC (basal cell carcinoma of skin) 12/01/2019   History of lumbar laminectomy 10/28/2018   PCP NOTES >>>>>>>>>>>>>>>>> 10/17/2016   Erectile dysfunction 04/13/2015   Hyperglycemia 10/13/2014   GERD (gastroesophageal reflux disease) 10/13/2014   Edema leg 07/02/2014   Palpitations 07/02/2014   Annual physical exam 08/05/2012   h/o goiter, remote Bx 08/05/2012   Allergic rhinitis 12/28/2011   Hypogonadism in male 04/28/2010   Vitamin D deficiency 04/28/2010   Dyslipidemia 04/28/2010   VARICOSE VEINS, LOWER EXTREMITIES 04/28/2010   IBS (irritable bowel syndrome) 04/28/2010   Anxiety state 07/23/2008   GLAUCOMA, BILATERAL 07/23/2008   PROCTITIS- anal pruritus 07/23/2008   Spinal stenosis 07/23/2008    PCP: Colon Branch, MD  REFERRING PROVIDER: Margaretha Sheffield, MD  REFERRING DIAG: [phone] (ICD-10-CM) - Lumbar stenosis  RATIONALE FOR EVALUATION AND TREATMENT: Rehabilitation  THERAPY DIAG:  Other low back pain  Radiculopathy, lumbar region  Muscle spasm of back  Other muscle spasm  Muscle weakness (generalized)  ONSET DATE: ~6 months  SUBJECTIVE:  SUBJECTIVE STATEMENT: Mowed the lawn earlier and having no pain. That DN really does help.   PAIN:  Are you having pain? No  PERTINENT HISTORY:  10/28/18 - L2-3, L3-4, L4-5 microscopic decompressive laminectomy; spinal stenosis, skin cancer, GERD, IBS, LE edema, HLD, LE varicose veins, anxiety, B glaucoma  PRECAUTIONS: None  WEIGHT BEARING RESTRICTIONS: No  FALLS:  Has patient fallen in last 6 months? No  LIVING ENVIRONMENT: Lives with: lives with their spouse Lives in: House/apartment Stairs: Yes: Internal: 14 steps; on right going up, on left going up, and can reach both Has  following equipment at home: Gilford Rile - 2 wheeled  OCCUPATION: Retired  PLOF: Independent and Leisure: boating (ski boat); walking daily for ~1/4 mile  PATIENT GOALS: "To be able to drive an RV from [place] to [place] in October."   OBJECTIVE:   DIAGNOSTIC FINDINGS:  Lumbar x-ray 08/17/21: 5 lumbar vertebra. Hip and SI joints well-preserved. There is been a central laminectomy from L2-S1. Level disc degeneration present with no  evidence of anterolisthesis. Lumbar MRI 07/08/21: 1. Interval progression of degenerative changes at L2-3 which in  association with epidural lipomatosis resulting in severe narrowing of the thecal sac, moderate to severe right and severe left neural foraminal narrowing.  2. Postsurgical changes with interval resolution of high-grade spinal canal stenosis at L3-4 and L4-5 with persistent high-grade bilateral neural foraminal narrowing at these levels.  3. Mild right and moderate left neural foraminal narrowing at L1-2.  4. Moderate right and mild left neural foraminal narrowing at L5-S1.   Lumbar x-ray 10/01/18: L1-2, L2-3, L3-4 and L4-5 with DDD and disc narrowing and anterior spurs, no anterolisthesis, the neuroforamen are narrowed at L3-4 and L4-5 on lateral radiographs.  PATIENT SURVEYS:  FOTO Lumbar = 69; predicted = 70  SCREENING FOR RED FLAGS: Bowel or bladder incontinence: No Spinal tumors: No Cauda equina syndrome: No Compression fracture: No Abdominal aneurysm: No  COGNITION:  Overall cognitive status: Within functional limits for tasks assessed     SENSATION: WFL  MUSCLE LENGTH: Hamstrings: mod tight R>L ITB: mild/mod tight R>L Piriformis: mod tight R>L Hip flexors: mod tight R>L Quads: mod tight R>L Heelcord: mod tight B  POSTURE: rounded shoulders, forward head, decreased lumbar lordosis, and decreased thoracic kyphosis  PALPATION: Increased muscle tension in B lumbar paraspinals and R>L glutes piriformis but denies TTP. Lumbar spine  hypomobility with CPAs but no pain reported.  LUMBAR ROM:   Active  AROM  Eval - 03/08/22  Flexion Hands to toes  Extension WFL  Right lateral flexion WFL  Left lateral flexion WFL  Right rotation [facility]  Left rotation WFL   (Blank rows = not tested)  LOWER EXTREMITY MMT:    MMT Right Eval 03/08/22 Left Eval 03/08/22  Hip flexion 4+ 4+  Hip extension 4 4  Hip abduction 4- 4  Hip adduction 4+ 4+  Hip internal rotation 4+ 4+  Hip external rotation 4- 4  Knee flexion 4+ 4+  Knee extension 5 5  Ankle dorsiflexion 4 4  Ankle plantarflexion 5 5  Ankle inversion    Ankle eversion     (Blank rows = not tested)  LUMBAR SPECIAL TESTS:  Straight leg raise test: Negative and FABER test: Negative   TODAY'S TREATMENT  03/31/22 Therapeutic Exercises: Nustep L5x8mn Sciatic nerve glide in supine 10x DKTC 2x30 sec hold SLR with core activation 2x10 each LE Seated modified pigeon pose 2x30 sec, R LE Seated lunge hip flexor stretch 2x30 sec R/L CArdine Eng  pose 2x30 sec  03/21/22 THERAPEUTIC EXERCISE: to improve flexibility, strength and mobility.  Verbal and tactile cues throughout for technique. Rec Bike - L3 x 7 min B heel raises with counter support 10 x 3", 2 sets Counter squat 10 x 5", 2 sets Hooklying R figure-4 piriformis stretch with light overpressure 2 x 30 sec Hooklying R KTOS piriformis stretch 2 x 30 sec  MANUAL THERAPY: To promote normalized muscle tension, improved flexibility, and reduced pain. Skilled palpation and monitoring of soft tissue during DN Trigger Point Dry-Needling  Treatment instructions: Expect mild to moderate muscle soreness. Patient verbalized understanding of these instructions and education. Patient Consent Given: Yes Education handout provided: Yes Muscles treated: R glute medius and minimus, medial and lateral piriforimis Electrical stimulation performed: No Parameters: N/A Treatment response/outcome: Twitch Response Elicited and Palpable  Increase in Muscle Length STM/DTM and pin & stretch to muscles addressed with DN   03/23/22 Therapeutic Exercise: Rec Bike L2x63mn Supine hamstring stretch 2x30" with strap Bridges with TrA brace x 10 S/L clams 10x5" hold Bridges with GTB x 20 reps Supine R KTOS stretch x 30 sec  Manual Therapy: STM to R glutes and lumbar paraspinals, palpable trigger point found in piriformis   03/21/22 THERAPEUTIC EXERCISE: to improve flexibility, strength and mobility.  Verbal and tactile cues throughout for technique. Rec Bike - L2 x 6 min Supine R/L HS stretch with strap 2 x 30 sec, 2nd rep with slight pull into DF for calf stretch; cues to keep knee straight Supine R/L ITB stretch cross body with strap 2 x 30 sec R/L Mod thomas hip flexor stretch with strap 2 x 30 sec - cues to avoid raising knee above height of hip Hooklying R/L figure-4 piriformis stretch with light overpressure 2 x 30 sec Hooklying R/L KTOS piriformis stretch 2 x 30 sec TrA isometric abdominal bracing 10 x 5" TrA + PPT 10 x 5" TrA/PPT + RTB bent knee fallouts 10 x 3" TrA/PPT + RTB march 10 x 3" Standing R/L runner's position gastroc stretch 2 x 30 sec   PATIENT EDUCATION:  Education details: role of DN and DN rational, procedure, outcomes, potential side effects, and recommended post-treatment exercises/activity Person educated: Patient Education method: Explanation and Handouts Education comprehension: verbalized understanding   HOME EXERCISE PROGRAM: Access Code: [email or web address]: [email or web address] Date: 03/28/2022 Prepared by: JAnnie Paras Exercises - Supine Quadriceps Stretch with Strap on Table  - 2 x daily - 7 x weekly - 3 reps - 30 sec hold - Supine Hamstring Stretch with Strap  - 2 x daily - 7 x weekly - 3 reps - 30 sec hold - Supine ITB Stretch with Strap  - 2 x daily - 7 x weekly - 3 reps - 30 sec hold - Supine Figure 4 Piriformis Stretch  - 2 x daily - 7 x weekly - 3 reps - 30 sec  hold - Supine Piriformis Stretch with Foot on Ground  - 2 x daily - 7 x weekly - 3 reps - 30 sec hold - Gastroc Stretch on Wall  - 2 x daily - 7 x weekly - 3 reps - 30 sec hold - Supine Sciatic Nerve Glide  - 2 x daily - 7 x weekly - 2 sets - 10 reps - 3 sec hold - Supine Posterior Pelvic Tilt  - 2 x daily - 7 x weekly - 2 sets - 10 reps - 5 sec hold - Hooklying Single Leg Bent Knee Fallouts with Resistance  -  1 x daily - 7 x weekly - 2 sets - 10 reps - 3 sec hold - Supine March with Resistance Band  - 1 x daily - 7 x weekly - 2 sets - 10 reps - 2-3 sec hold hold - Bridge with Resistance  - 1 x daily - 7 x weekly - 2 sets - 10 reps - 5 sec hold  Patient Education - Trigger Point Dry Needling  ASSESSMENT:  CLINICAL IMPRESSION: Focused session on stretches and lumbolevic mobility to improve muscle tension in R glutes. He required a lot of cues with the childs pose for proper form. Provided close monitoring and supervision with exercises to ensure proper technique for max benefit. Pt has met LTG #2 for pain and reported the DN has helped the tightness in his glutes and expressed interest in doing so again next visit.   OBJECTIVE IMPAIRMENTS decreased activity tolerance, decreased endurance, decreased knowledge of condition, decreased mobility, difficulty walking, decreased strength, increased fascial restrictions, impaired perceived functional ability, increased muscle spasms, impaired flexibility, improper body mechanics, postural dysfunction, and pain.   ACTIVITY LIMITATIONS sitting  PARTICIPATION LIMITATIONS: driving  PERSONAL FACTORS Age, Fitness, Past/current experiences, Time since onset of injury/illness/exacerbation, and 3+ comorbidities: L2-3, L3-4, L4-5 microscopic decompressive laminectomy; spinal stenosis, skin cancer, GERD, IBS, LE edema, HLD, LE varicose veins, anxiety, B glaucoma  are also affecting patient's functional outcome.   REHAB POTENTIAL: Excellent  CLINICAL DECISION  MAKING: Stable/uncomplicated  EVALUATION COMPLEXITY: Low   GOALS: Goals reviewed with patient? Yes  SHORT TERM GOALS: Target date: 03/29/2022   Patient will be independent with initial HEP.  Baseline:  Goal status: MET  03/28/22  LONG TERM GOALS: Target date: 04/19/2022    Patient will be independent with advanced/ongoing HEP to improve outcomes and carryover.  Baseline:  Goal status: IN PROGRESS  2.  Patient will report 75% improvement in low back pain and R LE radicular pain to improve QOL.  Baseline:  Goal status: MET - (03/31/22) 80% per pt  3.  Patient to demonstrate ability to achieve and maintain good spinal alignment/posturing and body mechanics needed for daily activities. Baseline:  Goal status: IN PROGRESS  4.  Patient will demonstrate improved B LE strength to >/= 4+ to 5/5 for improved stability and ease of mobility . Baseline: Refer to above MMT chart Goal status: IN PROGRESS  5.  Patient will report 65 on lumbar FOTO to demonstrate improved functional ability.  Baseline: 69 Goal status: IN PROGRESS   6.  Patient will tolerate >/= 1.5 hrs of sitting while driving to improve tolerance for travel.  Baseline: 30-45 minute before severe pain causing him to have to stop driving  Goal status: IN PROGRESS  7.  Patient will report ability to walk w/o noting R foot slap.  Baseline:  Goal status: IN PROGRESS  PLAN: PT FREQUENCY: 2x/week  PT DURATION: 6 weeks  PLANNED INTERVENTIONS: Therapeutic exercises, Therapeutic activity, Neuromuscular re-education, Balance training, Gait training, Patient/Family education, Joint mobilization, Dry Needling, Electrical stimulation, Spinal mobilization, Cryotherapy, Moist heat, Taping, Traction, Ultrasound, Ionotophoresis 67m/ml Dexamethasone, Manual therapy, and Re-evaluation.  PLAN FOR NEXT SESSION: add hip flexor stretch to HEP; progress lumbopelvic flexibility and strengthening - review and update HEP as indicated; MT +/- DN to  address increased muscle tension/tightness in lumbar paraspinals and glutes/piriformis   BArtist Pais PTA 03/31/2022, 11:12 AM

## 2022-04-04 ENCOUNTER — Ambulatory Visit: Payer: Medicare Other

## 2022-04-04 DIAGNOSIS — M5441 Lumbago with sciatica, right side: Secondary | ICD-10-CM | POA: Diagnosis not present

## 2022-04-04 DIAGNOSIS — M5416 Radiculopathy, lumbar region: Secondary | ICD-10-CM

## 2022-04-04 DIAGNOSIS — M62830 Muscle spasm of back: Secondary | ICD-10-CM

## 2022-04-04 DIAGNOSIS — M62838 Other muscle spasm: Secondary | ICD-10-CM

## 2022-04-04 DIAGNOSIS — M5459 Other low back pain: Secondary | ICD-10-CM

## 2022-04-04 DIAGNOSIS — M6281 Muscle weakness (generalized): Secondary | ICD-10-CM

## 2022-04-04 NOTE — Therapy (Signed)
OUTPATIENT PHYSICAL THERAPY TREATMENT   Patient Name: Tyrone Diaz MRN: [id number] DOB:12-19-1940, 81 y.o., male Today's Date: 04/04/2022   PT End of Session - 04/04/22 1145     Visit Number 6    Date for PT Re-Evaluation 04/19/22    Authorization Type Medicare & BCBS    PT Start Time 1103    PT Stop Time 1152    PT Time Calculation (min) 49 min    Activity Tolerance Patient tolerated treatment well    Behavior During Therapy WFL for tasks assessed/performed                  Past Medical History:  Diagnosis Date   ANEMIA    ANXIETY    ARTHRITIS, CERVICAL SPINE    BCC (basal cell carcinoma of skin)    Cataracts, bilateral    GERD    GLAUCOMA, BILATERAL    History of stress test 12/2001   Showed normal perfusion with mild apical thinning.   History of stress test 11/2007   Post stress myocardial perfusion images show a normal pattern of perfusion in the all regions, the post stress left ventricular is normal in size. There is no scintigraphic evidence of inducible myocardial ischemia. The post EF 64%   Hx of echocardiogram 12/2001   Had previously shown mild MR as well as mild TR with normal systolic function.   Hx of echocardiogram 11/2006   5-years follow-up, this showed normal systolic as well as diastolic function. There was suggestion of flat mitral valve leaflet coaptation with borderline anterior mitral valve leaflet prolapse with mild associated mitral valve prolapse. He had mild TR. There was very mild aortic valve sclerosis without stenosis or insufficiency.   HYPERLIPIDEMIA    HYPOTHYROIDISM    PROCTITIS    TESTICULAR HYPOFUNCTION    Unspecified vitamin D deficiency    VARICOSE VEINS, LOWER EXTREMITIES    Past Surgical History:  Procedure Laterality Date   BACK SURGERY  2019   per pt, low back surgery   DENTAL SURGERY     ear injury     piece of ear plug dislodged from ear   TONSILECTOMY, ADENOIDECTOMY, BILATERAL MYRINGOTOMY AND TUBES   09[birth year]11[birth year]1941   WISDOM TOOTH EXTRACTION     Patient Active Problem List   Diagnosis Date Noted   BCC (basal cell carcinoma of skin) 12/01/2019   History of lumbar laminectomy 10/28/2018   PCP NOTES >>>>>>>>>>>>>>>>> 10/17/2016   Erectile dysfunction 04/13/2015   Hyperglycemia 10/13/2014   GERD (gastroesophageal reflux disease) 10/13/2014   Edema leg 07/02/2014   Palpitations 07/02/2014   Annual physical exam 08/05/2012   h/o goiter, remote Bx 08/05/2012   Allergic rhinitis 12/28/2011   Hypogonadism in male 04/28/2010   Vitamin D deficiency 04/28/2010   Dyslipidemia 04/28/2010   VARICOSE VEINS, LOWER EXTREMITIES 04/28/2010   IBS (irritable bowel syndrome) 04/28/2010   Anxiety state 07/23/2008   GLAUCOMA, BILATERAL 07/23/2008   PROCTITIS- anal pruritus 07/23/2008   Spinal stenosis 07/23/2008    PCP: Colon Branch, MD  REFERRING PROVIDER: Margaretha Sheffield, MD  REFERRING DIAG: [phone] (ICD-10-CM) - Lumbar stenosis  RATIONALE FOR EVALUATION AND TREATMENT: Rehabilitation  THERAPY DIAG:  Other low back pain  Radiculopathy, lumbar region  Muscle spasm of back  Other muscle spasm  Muscle weakness (generalized)  ONSET DATE: ~6 months  SUBJECTIVE:  SUBJECTIVE STATEMENT: "AC went out at home, with all the things he had to do he is hurting more today."  PAIN:  Are you having pain? No  - just soreness/ feeling worn out  PERTINENT HISTORY:  10/28/18 - L2-3, L3-4, L4-5 microscopic decompressive laminectomy; spinal stenosis, skin cancer, GERD, IBS, LE edema, HLD, LE varicose veins, anxiety, B glaucoma  PRECAUTIONS: None  WEIGHT BEARING RESTRICTIONS: No  FALLS:  Has patient fallen in last 6 months? No  LIVING ENVIRONMENT: Lives with: lives with their spouse Lives in: House/apartment Stairs:  Yes: Internal: 14 steps; on right going up, on left going up, and can reach both Has following equipment at home: Gilford Rile - 2 wheeled  OCCUPATION: Retired  PLOF: Independent and Leisure: boating (ski boat); walking daily for ~1/4 mile  PATIENT GOALS: "To be able to drive an RV from [place] to [place] in October."   OBJECTIVE:   DIAGNOSTIC FINDINGS:  Lumbar x-ray 08/17/21: 5 lumbar vertebra. Hip and SI joints well-preserved. There is been a central laminectomy from L2-S1. Level disc degeneration present with no  evidence of anterolisthesis. Lumbar MRI 07/08/21: 1. Interval progression of degenerative changes at L2-3 which in  association with epidural lipomatosis resulting in severe narrowing of the thecal sac, moderate to severe right and severe left neural foraminal narrowing.  2. Postsurgical changes with interval resolution of high-grade spinal canal stenosis at L3-4 and L4-5 with persistent high-grade bilateral neural foraminal narrowing at these levels.  3. Mild right and moderate left neural foraminal narrowing at L1-2.  4. Moderate right and mild left neural foraminal narrowing at L5-S1.   Lumbar x-ray 10/01/18: L1-2, L2-3, L3-4 and L4-5 with DDD and disc narrowing and anterior spurs, no anterolisthesis, the neuroforamen are narrowed at L3-4 and L4-5 on lateral radiographs.  PATIENT SURVEYS:  FOTO Lumbar = 69; predicted = 70  SCREENING FOR RED FLAGS: Bowel or bladder incontinence: No Spinal tumors: No Cauda equina syndrome: No Compression fracture: No Abdominal aneurysm: No  COGNITION:  Overall cognitive status: Within functional limits for tasks assessed     SENSATION: WFL  MUSCLE LENGTH: Hamstrings: mod tight R>L ITB: mild/mod tight R>L Piriformis: mod tight R>L Hip flexors: mod tight R>L Quads: mod tight R>L Heelcord: mod tight B  POSTURE: rounded shoulders, forward head, decreased lumbar lordosis, and decreased thoracic kyphosis  PALPATION: Increased muscle  tension in B lumbar paraspinals and R>L glutes piriformis but denies TTP. Lumbar spine hypomobility with CPAs but no pain reported.  LUMBAR ROM:   Active  AROM  Eval - 03/08/22  Flexion Hands to toes  Extension WFL  Right lateral flexion WFL  Left lateral flexion WFL  Right rotation [facility]  Left rotation WFL   (Blank rows = not tested)  LOWER EXTREMITY MMT:    MMT Right Eval 03/08/22 Left Eval 03/08/22  Hip flexion 4+ 4+  Hip extension 4 4  Hip abduction 4- 4  Hip adduction 4+ 4+  Hip internal rotation 4+ 4+  Hip external rotation 4- 4  Knee flexion 4+ 4+  Knee extension 5 5  Ankle dorsiflexion 4 4  Ankle plantarflexion 5 5  Ankle inversion    Ankle eversion     (Blank rows = not tested)  LUMBAR SPECIAL TESTS:  Straight leg raise test: Negative and FABER test: Negative   TODAY'S TREATMENT   04/04/22 THERAPEUTIC EXERCISE: to improve flexibility, strength and mobility.  Verbal and tactile cues throughout for technique. Bike L2 x 8 min Modified pigeon pose stretch x  30 sec bil 3 way flexion stretch with green Pball x 30 sec Seated lunge hip flexor stretch x 30 sec bil Standing lumbar flexion stretch x 30 holding onto sink Standing QL stretch at doorframe x 30 sec bil  MANUAL THERAPY: To promote normalized muscle tension, improved flexibility, and reduced pain. Skilled palpation and monitoring of soft tissue during DN Trigger Point Dry-Needling  -  performed exclusively by Annie Paras, PT Treatment instructions: Expect mild to moderate muscle soreness. Patient verbalized understanding of these instructions and education. Patient Consent Given: Yes Education handout provided: Previously provided Muscles treated: B lumbar erector spinae and R QL Electrical stimulation performed: No Parameters: N/A Treatment response/outcome: Twitch Response Elicited and Palpable Increase in Muscle Length STM/DTM and pin & stretch to muscles addressed with DN   03/31/22 Therapeutic  Exercises: Nustep L5x39mn Sciatic nerve glide in supine 10x DKTC 2x30 sec hold SLR with core activation 2x10 each LE Seated modified pigeon pose 2x30 sec, R LE Seated lunge hip flexor stretch 2x30 sec R/L Childs pose 2x30 sec   03/21/22 THERAPEUTIC EXERCISE: to improve flexibility, strength and mobility.  Verbal and tactile cues throughout for technique. Rec Bike - L3 x 7 min B heel raises with counter support 10 x 3", 2 sets Counter squat 10 x 5", 2 sets Hooklying R figure-4 piriformis stretch with light overpressure 2 x 30 sec Hooklying R KTOS piriformis stretch 2 x 30 sec  MANUAL THERAPY: To promote normalized muscle tension, improved flexibility, and reduced pain. Skilled palpation and monitoring of soft tissue during DN Trigger Point Dry-Needling  Treatment instructions: Expect mild to moderate muscle soreness. Patient verbalized understanding of these instructions and education. Patient Consent Given: Yes Education handout provided: Yes Muscles treated: R glute medius and minimus, medial and lateral piriforimis Electrical stimulation performed: No Parameters: N/A Treatment response/outcome: Twitch Response Elicited and Palpable Increase in Muscle Length STM/DTM and pin & stretch to muscles addressed with DN   PATIENT EDUCATION:  Education details: role of DN and DN rational, procedure, outcomes, potential side effects, and recommended post-treatment exercises/activity Person educated: Patient Education method: Explanation and Handouts Education comprehension: verbalized understanding   HOME EXERCISE PROGRAM: Access Code: [email or web address]: [email or web address] Date: 03/28/2022 Prepared by: JAnnie Paras Exercises - Supine Quadriceps Stretch with Strap on Table  - 2 x daily - 7 x weekly - 3 reps - 30 sec hold - Supine Hamstring Stretch with Strap  - 2 x daily - 7 x weekly - 3 reps - 30 sec hold - Supine ITB Stretch with Strap  - 2 x daily - 7 x weekly - 3  reps - 30 sec hold - Supine Figure 4 Piriformis Stretch  - 2 x daily - 7 x weekly - 3 reps - 30 sec hold - Supine Piriformis Stretch with Foot on Ground  - 2 x daily - 7 x weekly - 3 reps - 30 sec hold - Gastroc Stretch on Wall  - 2 x daily - 7 x weekly - 3 reps - 30 sec hold - Supine Sciatic Nerve Glide  - 2 x daily - 7 x weekly - 2 sets - 10 reps - 3 sec hold - Supine Posterior Pelvic Tilt  - 2 x daily - 7 x weekly - 2 sets - 10 reps - 5 sec hold - Hooklying Single Leg Bent Knee Fallouts with Resistance  - 1 x daily - 7 x weekly - 2 sets - 10 reps - 3 sec hold - Supine March  with Resistance Band  - 1 x daily - 7 x weekly - 2 sets - 10 reps - 2-3 sec hold hold - Bridge with Resistance  - 1 x daily - 7 x weekly - 2 sets - 10 reps - 5 sec hold  Patient Education - Trigger Point Dry Needling  ASSESSMENT:  CLINICAL IMPRESSION: Pt arrived with reports of soreness and being worn out in the musculature of his R low back. Focused mainly on stretching to improve muscle extensibility and joint mobility. Instructed on longer duration hold for stretches to allow for increased muscle relaxation. He showed a good tolerance for the stretches today. He c/o soreness along the R side of low back and glutes today from the work done yesterday and finished session having PT perform TPDN.  OBJECTIVE IMPAIRMENTS decreased activity tolerance, decreased endurance, decreased knowledge of condition, decreased mobility, difficulty walking, decreased strength, increased fascial restrictions, impaired perceived functional ability, increased muscle spasms, impaired flexibility, improper body mechanics, postural dysfunction, and pain.   ACTIVITY LIMITATIONS sitting  PARTICIPATION LIMITATIONS: driving  PERSONAL FACTORS Age, Fitness, Past/current experiences, Time since onset of injury/illness/exacerbation, and 3+ comorbidities: L2-3, L3-4, L4-5 microscopic decompressive laminectomy; spinal stenosis, skin cancer, GERD, IBS,  LE edema, HLD, LE varicose veins, anxiety, B glaucoma  are also affecting patient's functional outcome.   REHAB POTENTIAL: Excellent  CLINICAL DECISION MAKING: Stable/uncomplicated  EVALUATION COMPLEXITY: Low   GOALS: Goals reviewed with patient? Yes  SHORT TERM GOALS: Target date: 03/29/2022   Patient will be independent with initial HEP.  Baseline:  Goal status: MET  03/28/22  LONG TERM GOALS: Target date: 04/19/2022    Patient will be independent with advanced/ongoing HEP to improve outcomes and carryover.  Baseline:  Goal status: IN PROGRESS  2.  Patient will report 75% improvement in low back pain and R LE radicular pain to improve QOL.  Baseline:  Goal status: MET - (03/31/22) 80% per pt  3.  Patient to demonstrate ability to achieve and maintain good spinal alignment/posturing and body mechanics needed for daily activities. Baseline:  Goal status: IN PROGRESS  4.  Patient will demonstrate improved B LE strength to >/= 4+ to 5/5 for improved stability and ease of mobility . Baseline: Refer to above MMT chart Goal status: IN PROGRESS  5.  Patient will report 56 on lumbar FOTO to demonstrate improved functional ability.  Baseline: 69 Goal status: IN PROGRESS   6.  Patient will tolerate >/= 1.5 hrs of sitting while driving to improve tolerance for travel.  Baseline: 30-45 minute before severe pain causing him to have to stop driving  Goal status: IN PROGRESS  7.  Patient will report ability to walk w/o noting R foot slap.  Baseline:  Goal status: IN PROGRESS  PLAN: PT FREQUENCY: 2x/week  PT DURATION: 6 weeks  PLANNED INTERVENTIONS: Therapeutic exercises, Therapeutic activity, Neuromuscular re-education, Balance training, Gait training, Patient/Family education, Joint mobilization, Dry Needling, Electrical stimulation, Spinal mobilization, Cryotherapy, Moist heat, Taping, Traction, Ultrasound, Ionotophoresis 28m/ml Dexamethasone, Manual therapy, and  Re-evaluation.  PLAN FOR NEXT SESSION:  reassess LE strength; assess response to DN; add hip flexor stretch to HEP; progress lumbopelvic flexibility and strengthening - review and update HEP as indicated; MT +/- DN to address increased muscle tension/tightness in lumbar paraspinals and glutes/piriformis   BArtist Pais PTA 04/04/2022, 11:58 AM  JPercival Spanish PT 04/04/2022, 12:11 PM

## 2022-04-07 ENCOUNTER — Ambulatory Visit: Payer: Medicare Other

## 2022-04-07 DIAGNOSIS — M62838 Other muscle spasm: Secondary | ICD-10-CM | POA: Diagnosis not present

## 2022-04-07 DIAGNOSIS — M6281 Muscle weakness (generalized): Secondary | ICD-10-CM

## 2022-04-07 DIAGNOSIS — M5441 Lumbago with sciatica, right side: Secondary | ICD-10-CM

## 2022-04-07 DIAGNOSIS — M5416 Radiculopathy, lumbar region: Secondary | ICD-10-CM

## 2022-04-07 DIAGNOSIS — M5459 Other low back pain: Secondary | ICD-10-CM | POA: Diagnosis not present

## 2022-04-07 DIAGNOSIS — M62830 Muscle spasm of back: Secondary | ICD-10-CM | POA: Diagnosis not present

## 2022-04-07 NOTE — Therapy (Signed)
OUTPATIENT PHYSICAL THERAPY TREATMENT   Patient Name: KASHIS PENLEY MRN: [id number] DOB:02/11/1941, 81 y.o., male Today's Date: 04/07/2022   PT End of Session - 04/07/22 1056     Visit Number 7    Date for PT Re-Evaluation 04/19/22    Authorization Type Medicare & BCBS    PT Start Time 1016    PT Stop Time 1100    PT Time Calculation (min) 44 min    Activity Tolerance Patient tolerated treatment well    Behavior During Therapy WFL for tasks assessed/performed                   Past Medical History:  Diagnosis Date   ANEMIA    ANXIETY    ARTHRITIS, CERVICAL SPINE    BCC (basal cell carcinoma of skin)    Cataracts, bilateral    GERD    GLAUCOMA, BILATERAL    History of stress test 12/2001   Showed normal perfusion with mild apical thinning.   History of stress test 11/2007   Post stress myocardial perfusion images show a normal pattern of perfusion in the all regions, the post stress left ventricular is normal in size. There is no scintigraphic evidence of inducible myocardial ischemia. The post EF 64%   Hx of echocardiogram 12/2001   Had previously shown mild MR as well as mild TR with normal systolic function.   Hx of echocardiogram 11/2006   5-years follow-up, this showed normal systolic as well as diastolic function. There was suggestion of flat mitral valve leaflet coaptation with borderline anterior mitral valve leaflet prolapse with mild associated mitral valve prolapse. He had mild TR. There was very mild aortic valve sclerosis without stenosis or insufficiency.   HYPERLIPIDEMIA    HYPOTHYROIDISM    PROCTITIS    TESTICULAR HYPOFUNCTION    Unspecified vitamin D deficiency    VARICOSE VEINS, LOWER EXTREMITIES    Past Surgical History:  Procedure Laterality Date   BACK SURGERY  2019   per pt, low back surgery   DENTAL SURGERY     ear injury     piece of ear plug dislodged from ear   TONSILECTOMY, ADENOIDECTOMY, BILATERAL MYRINGOTOMY AND TUBES   09/11/1941   WISDOM TOOTH EXTRACTION     Patient Active Problem List   Diagnosis Date Noted   BCC (basal cell carcinoma of skin) 12/01/2019   History of lumbar laminectomy 10/28/2018   PCP NOTES >>>>>>>>>>>>>>>>> 10/17/2016   Erectile dysfunction 04/13/2015   Hyperglycemia 10/13/2014   GERD (gastroesophageal reflux disease) 10/13/2014   Edema leg 07/02/2014   Palpitations 07/02/2014   Annual physical exam 08/05/2012   h/o goiter, remote Bx 08/05/2012   Allergic rhinitis 12/28/2011   Hypogonadism in male 04/28/2010   Vitamin D deficiency 04/28/2010   Dyslipidemia 04/28/2010   VARICOSE VEINS, LOWER EXTREMITIES 04/28/2010   IBS (irritable bowel syndrome) 04/28/2010   Anxiety state 07/23/2008   GLAUCOMA, BILATERAL 07/23/2008   PROCTITIS- anal pruritus 07/23/2008   Spinal stenosis 07/23/2008    PCP: Colon Branch, MD  REFERRING PROVIDER: Margaretha Sheffield, MD  REFERRING DIAG: [phone] (ICD-10-CM) - Lumbar stenosis  RATIONALE FOR EVALUATION AND TREATMENT: Rehabilitation  THERAPY DIAG:  Other low back pain  Radiculopathy, lumbar region  Muscle spasm of back  Other muscle spasm  Muscle weakness (generalized)  Acute right-sided low back pain with right-sided sciatica  ONSET DATE: ~6 months  SUBJECTIVE:  SUBJECTIVE STATEMENT: Pt reports doing way better today, "that DN really does help out."  PAIN:  Are you having pain? No  - just soreness/ feeling worn out  PERTINENT HISTORY:  10/28/18 - L2-3, L3-4, L4-5 microscopic decompressive laminectomy; spinal stenosis, skin cancer, GERD, IBS, LE edema, HLD, LE varicose veins, anxiety, B glaucoma  PRECAUTIONS: None  WEIGHT BEARING RESTRICTIONS: No  FALLS:  Has patient fallen in last 6 months? No  LIVING ENVIRONMENT: Lives with: lives with  their spouse Lives in: House/apartment Stairs: Yes: Internal: 14 steps; on right going up, on left going up, and can reach both Has following equipment at home: Gilford Rile - 2 wheeled  OCCUPATION: Retired  PLOF: Independent and Leisure: boating (ski boat); walking daily for ~1/4 mile  PATIENT GOALS: "To be able to drive an RV from [place] to [place] in October."   OBJECTIVE:   DIAGNOSTIC FINDINGS:  Lumbar x-ray 08/17/21: 5 lumbar vertebra. Hip and SI joints well-preserved. There is been a central laminectomy from L2-S1. Level disc degeneration present with no  evidence of anterolisthesis. Lumbar MRI 07/08/21: 1. Interval progression of degenerative changes at L2-3 which in  association with epidural lipomatosis resulting in severe narrowing of the thecal sac, moderate to severe right and severe left neural foraminal narrowing.  2. Postsurgical changes with interval resolution of high-grade spinal canal stenosis at L3-4 and L4-5 with persistent high-grade bilateral neural foraminal narrowing at these levels.  3. Mild right and moderate left neural foraminal narrowing at L1-2.  4. Moderate right and mild left neural foraminal narrowing at L5-S1.   Lumbar x-ray 10/01/18: L1-2, L2-3, L3-4 and L4-5 with DDD and disc narrowing and anterior spurs, no anterolisthesis, the neuroforamen are narrowed at L3-4 and L4-5 on lateral radiographs.  PATIENT SURVEYS:  FOTO Lumbar = 69; predicted = 70  SCREENING FOR RED FLAGS: Bowel or bladder incontinence: No Spinal tumors: No Cauda equina syndrome: No Compression fracture: No Abdominal aneurysm: No  COGNITION:  Overall cognitive status: Within functional limits for tasks assessed     SENSATION: WFL  MUSCLE LENGTH: Hamstrings: mod tight R>L ITB: mild/mod tight R>L Piriformis: mod tight R>L Hip flexors: mod tight R>L Quads: mod tight R>L Heelcord: mod tight B  POSTURE: rounded shoulders, forward head, decreased lumbar lordosis, and decreased  thoracic kyphosis  PALPATION: Increased muscle tension in B lumbar paraspinals and R>L glutes piriformis but denies TTP. Lumbar spine hypomobility with CPAs but no pain reported.  LUMBAR ROM:   Active  AROM  Eval - 03/08/22  Flexion Hands to toes  Extension WFL  Right lateral flexion WFL  Left lateral flexion WFL  Right rotation [facility]  Left rotation WFL   (Blank rows = not tested)  LOWER EXTREMITY MMT:    MMT Right Eval 03/08/22 Left Eval 03/08/22 Right 04/07/22 Left 04/07/22  Hip flexion 4+ 4+ 4+ 4+  Hip extension 4 4 4+ 4+  Hip abduction 4- 4 4+ 4+  Hip adduction 4+ 4+ 4+ 4+  Hip internal rotation 4+ 4+    Hip external rotation 4- 4    Knee flexion 4+ 4+ 4 - limited by cramping 4+  Knee extension 5 5    Ankle dorsiflexion 4 4    Ankle plantarflexion 5 5    Ankle inversion      Ankle eversion       (Blank rows = not tested)  LUMBAR SPECIAL TESTS:  Straight leg raise test: Negative and FABER test: Negative   TODAY'S TREATMENT  04/07/22 Therapeutic  Exercise: Bike L3x76mn Assessed LE strength  Standing hip hinge x 10 Standing HS curl at counter x 10 - 2nd set progressed to GTB Standing hip ext with GTB x 10   Manual Therapy: STM to R hamstrings, focused on proximal ST/SM Passive hamstring stretch x 30 sec  04/04/22 THERAPEUTIC EXERCISE: to improve flexibility, strength and mobility.  Verbal and tactile cues throughout for technique. Bike L2 x 8 min Modified pigeon pose stretch x 30 sec bil 3 way flexion stretch with green Pball x 30 sec Seated lunge hip flexor stretch x 30 sec bil Standing lumbar flexion stretch x 30 holding onto sink Standing QL stretch at doorframe x 30 sec bil  MANUAL THERAPY: To promote normalized muscle tension, improved flexibility, and reduced pain. Skilled palpation and monitoring of soft tissue during DN Trigger Point Dry-Needling  -  performed exclusively by JAnnie Paras PT Treatment instructions: Expect mild to moderate muscle  soreness. Patient verbalized understanding of these instructions and education. Patient Consent Given: Yes Education handout provided: Previously provided Muscles treated: B lumbar erector spinae and R QL Electrical stimulation performed: No Parameters: N/A Treatment response/outcome: Twitch Response Elicited and Palpable Increase in Muscle Length STM/DTM and pin & stretch to muscles addressed with DN   03/31/22 Therapeutic Exercises: Nustep L5x69m Sciatic nerve glide in supine 10x DKTC 2x30 sec hold SLR with core activation 2x10 each LE Seated modified pigeon pose 2x30 sec, R LE Seated lunge hip flexor stretch 2x30 sec R/L Childs pose 2x30 sec   03/21/22 THERAPEUTIC EXERCISE: to improve flexibility, strength and mobility.  Verbal and tactile cues throughout for technique. Rec Bike - L3 x 7 min B heel raises with counter support 10 x 3", 2 sets Counter squat 10 x 5", 2 sets Hooklying R figure-4 piriformis stretch with light overpressure 2 x 30 sec Hooklying R KTOS piriformis stretch 2 x 30 sec  MANUAL THERAPY: To promote normalized muscle tension, improved flexibility, and reduced pain. Skilled palpation and monitoring of soft tissue during DN Trigger Point Dry-Needling  Treatment instructions: Expect mild to moderate muscle soreness. Patient verbalized understanding of these instructions and education. Patient Consent Given: Yes Education handout provided: Yes Muscles treated: R glute medius and minimus, medial and lateral piriforimis Electrical stimulation performed: No Parameters: N/A Treatment response/outcome: Twitch Response Elicited and Palpable Increase in Muscle Length STM/DTM and pin & stretch to muscles addressed with DN   PATIENT EDUCATION:  Education details: role of DN and DN rational, procedure, outcomes, potential side effects, and recommended post-treatment exercises/activity Person educated: Patient Education method: Explanation and Handouts Education  comprehension: verbalized understanding   HOME EXERCISE PROGRAM: Access Code: [email or web address]: [email or web address] Date: 04/07/2022 Prepared by: BrClarene EssexExercises - Supine Quadriceps Stretch with Strap on Table  - 2 x daily - 7 x weekly - 3 reps - 30 sec hold - Supine Hamstring Stretch with Strap  - 2 x daily - 7 x weekly - 3 reps - 30 sec hold - Supine ITB Stretch with Strap  - 2 x daily - 7 x weekly - 3 reps - 30 sec hold - Supine Figure 4 Piriformis Stretch  - 2 x daily - 7 x weekly - 3 reps - 30 sec hold - Supine Piriformis Stretch with Foot on Ground  - 2 x daily - 7 x weekly - 3 reps - 30 sec hold - Gastroc Stretch on Wall  - 2 x daily - 7 x weekly - 3 reps - 30 sec hold -  Supine Sciatic Nerve Glide  - 2 x daily - 7 x weekly - 2 sets - 10 reps - 3 sec hold - Supine Posterior Pelvic Tilt  - 2 x daily - 7 x weekly - 2 sets - 10 reps - 5 sec hold - Hooklying Single Leg Bent Knee Fallouts with Resistance  - 1 x daily - 7 x weekly - 2 sets - 10 reps - 3 sec hold - Supine March with Resistance Band  - 1 x daily - 7 x weekly - 2 sets - 10 reps - 2-3 sec hold hold - Bridge with Resistance  - 1 x daily - 7 x weekly - 2 sets - 10 reps - 5 sec hold - [place] Deadlift- Hands on Hips  - 1 x daily - 3 x weekly - 2 sets - 10 reps - Standing Hamstring Curl with Resistance  - 1 x daily - 3 x weekly - 2 sets - 10 reps  Patient Education - Trigger Point Dry Needling  ASSESSMENT:  CLINICAL IMPRESSION: Pt demonstrates the most weakness in R HS also being limited by cramping in that area. He responded well to MT and was able to complete HS exercises w/o cramping afterward. Advanced posterior hip strengthening today, to increase joint stability, providing cues with hip hinges to avoid locking knees. Overall MMT scores show improvement in overall strength but mostly weak in R HS.   OBJECTIVE IMPAIRMENTS decreased activity tolerance, decreased endurance, decreased knowledge of  condition, decreased mobility, difficulty walking, decreased strength, increased fascial restrictions, impaired perceived functional ability, increased muscle spasms, impaired flexibility, improper body mechanics, postural dysfunction, and pain.   ACTIVITY LIMITATIONS sitting  PARTICIPATION LIMITATIONS: driving  PERSONAL FACTORS Age, Fitness, Past/current experiences, Time since onset of injury/illness/exacerbation, and 3+ comorbidities: L2-3, L3-4, L4-5 microscopic decompressive laminectomy; spinal stenosis, skin cancer, GERD, IBS, LE edema, HLD, LE varicose veins, anxiety, B glaucoma  are also affecting patient's functional outcome.   REHAB POTENTIAL: Excellent  CLINICAL DECISION MAKING: Stable/uncomplicated  EVALUATION COMPLEXITY: Low   GOALS: Goals reviewed with patient? Yes  SHORT TERM GOALS: Target date: 03/29/2022   Patient will be independent with initial HEP.  Baseline:  Goal status: MET  03/28/22  LONG TERM GOALS: Target date: 04/19/2022    Patient will be independent with advanced/ongoing HEP to improve outcomes and carryover.  Baseline:  Goal status: IN PROGRESS  2.  Patient will report 75% improvement in low back pain and R LE radicular pain to improve QOL.  Baseline:  Goal status: MET - (03/31/22) 80% per pt  3.  Patient to demonstrate ability to achieve and maintain good spinal alignment/posturing and body mechanics needed for daily activities. Baseline:  Goal status: IN PROGRESS  4.  Patient will demonstrate improved B LE strength to >/= 4+ to 5/5 for improved stability and ease of mobility . Baseline: Refer to above MMT chart Goal status: IN PROGRESS  5.  Patient will report 27 on lumbar FOTO to demonstrate improved functional ability.  Baseline: 69 Goal status: IN PROGRESS   6.  Patient will tolerate >/= 1.5 hrs of sitting while driving to improve tolerance for travel.  Baseline: 30-45 minute before severe pain causing him to have to stop driving  Goal  status: IN PROGRESS  7.  Patient will report ability to walk w/o noting R foot slap.  Baseline:  Goal status: IN PROGRESS  PLAN: PT FREQUENCY: 2x/week  PT DURATION: 6 weeks  PLANNED INTERVENTIONS: Therapeutic exercises, Therapeutic activity, Neuromuscular re-education,  Balance training, Gait training, Patient/Family education, Joint mobilization, Dry Needling, Electrical stimulation, Spinal mobilization, Cryotherapy, Moist heat, Taping, Traction, Ultrasound, Ionotophoresis 45m/ml Dexamethasone, Manual therapy, and Re-evaluation.  PLAN FOR NEXT SESSION:  hamstring strengthening; progress lumbopelvic flexibility and strengthening - review and update HEP as indicated; MT +/- DN to address increased muscle tension/tightness in lumbar paraspinals and glutes/piriformis     Summer Parthasarathy L Osborn Pullin, PTA 04/07/2022, 11:05 AM

## 2022-04-11 ENCOUNTER — Ambulatory Visit: Payer: Medicare Other | Attending: Physical Medicine and Rehabilitation | Admitting: Physical Therapy

## 2022-04-11 ENCOUNTER — Encounter: Payer: Self-pay | Admitting: Physical Therapy

## 2022-04-11 DIAGNOSIS — M62838 Other muscle spasm: Secondary | ICD-10-CM | POA: Insufficient documentation

## 2022-04-11 DIAGNOSIS — M5416 Radiculopathy, lumbar region: Secondary | ICD-10-CM | POA: Diagnosis not present

## 2022-04-11 DIAGNOSIS — M62830 Muscle spasm of back: Secondary | ICD-10-CM | POA: Diagnosis not present

## 2022-04-11 DIAGNOSIS — M5459 Other low back pain: Secondary | ICD-10-CM | POA: Diagnosis not present

## 2022-04-11 DIAGNOSIS — M6281 Muscle weakness (generalized): Secondary | ICD-10-CM | POA: Diagnosis not present

## 2022-04-11 NOTE — Therapy (Addendum)
OUTPATIENT PHYSICAL THERAPY TREATMENT / PROGRESS NOTE / DISCHARGE SUMMARY   Patient Name: Tyrone Diaz MRN: [id number] DOB:[birth year], 81 y.o., male Today's Date: 04/11/2022  Progress Note  Reporting Period 03/08/2022 to 04/11/2022  See note below for Objective Data and Assessment of Progress/Goals.       PT End of Session - 04/11/22 1102     Visit Number 8    Date for PT Re-Evaluation 04/19/22    Authorization Type Medicare & BCBS    PT Start Time 1102    PT Stop Time 1142    PT Time Calculation (min) 40 min    Activity Tolerance Patient tolerated treatment well    Behavior During Therapy WFL for tasks assessed/performed                    Past Medical History:  Diagnosis Date   ANEMIA    ANXIETY    ARTHRITIS, CERVICAL SPINE    BCC (basal cell carcinoma of skin)    Cataracts, bilateral    GERD    GLAUCOMA, BILATERAL    History of stress test 12/2001   Showed normal perfusion with mild apical thinning.   History of stress test 11/2007   Post stress myocardial perfusion images show a normal pattern of perfusion in the all regions, the post stress left ventricular is normal in size. There is no scintigraphic evidence of inducible myocardial ischemia. The post EF 64%   Hx of echocardiogram 12/2001   Had previously shown mild MR as well as mild TR with normal systolic function.   Hx of echocardiogram 11/2006   5-years follow-up, this showed normal systolic as well as diastolic function. There was suggestion of flat mitral valve leaflet coaptation with borderline anterior mitral valve leaflet prolapse with mild associated mitral valve prolapse. He had mild TR. There was very mild aortic valve sclerosis without stenosis or insufficiency.   HYPERLIPIDEMIA    HYPOTHYROIDISM    PROCTITIS    TESTICULAR HYPOFUNCTION    Unspecified vitamin D deficiency    VARICOSE VEINS, LOWER EXTREMITIES    Past Surgical History:  Procedure Laterality Date   BACK SURGERY  2019    per pt, low back surgery   DENTAL SURGERY     ear injury     piece of ear plug dislodged from ear   TONSILECTOMY, ADENOIDECTOMY, BILATERAL MYRINGOTOMY AND TUBES  09/11/1941   WISDOM TOOTH EXTRACTION     Patient Active Problem List   Diagnosis Date Noted   BCC (basal cell carcinoma of skin) 12/01/2019   History of lumbar laminectomy 10/28/2018   PCP NOTES >>>>>>>>>>>>>>>>> 10/17/2016   Erectile dysfunction 04/13/2015   Hyperglycemia 10/13/2014   GERD (gastroesophageal reflux disease) 10/13/2014   Edema leg 07/02/2014   Palpitations 07/02/2014   Annual physical exam 08/05/2012   h/o goiter, remote Bx 08/05/2012   Allergic rhinitis 12/28/2011   Hypogonadism in male 04/28/2010   Vitamin D deficiency 04/28/2010   Dyslipidemia 04/28/2010   VARICOSE VEINS, LOWER EXTREMITIES 04/28/2010   IBS (irritable bowel syndrome) 04/28/2010   Anxiety state 07/23/2008   GLAUCOMA, BILATERAL 07/23/2008   PROCTITIS- anal pruritus 07/23/2008   Spinal stenosis 07/23/2008    PCP: Colon Branch, MD  REFERRING PROVIDER: Margaretha Sheffield, MD  REFERRING DIAG: [phone] (ICD-10-CM) - Lumbar stenosis  RATIONALE FOR EVALUATION AND TREATMENT: Rehabilitation  THERAPY DIAG:  Other low back pain  Radiculopathy, lumbar region  Muscle spasm of back  Other muscle spasm  Muscle weakness (generalized)  ONSET DATE: ~6 months  SUBJECTIVE:                                                                                                                                                                                           SUBJECTIVE STATEMENT: Pt reports he has been telling his wife that PT has really helped. He feels ready to try transitioning to his HEP but would like one more session of DN.  PAIN:  Are you having pain? No   PERTINENT HISTORY:  10/28/18 - L2-3, L3-4, L4-5 microscopic decompressive laminectomy; spinal stenosis, skin cancer, GERD, IBS, LE edema, HLD, LE varicose veins, anxiety, B  glaucoma  PRECAUTIONS: None  WEIGHT BEARING RESTRICTIONS: No  FALLS:  Has patient fallen in last 6 months? No  LIVING ENVIRONMENT: Lives with: lives with their spouse Lives in: House/apartment Stairs: Yes: Internal: 14 steps; on right going up, on left going up, and can reach both Has following equipment at home: Gilford Rile - 2 wheeled  OCCUPATION: Retired  PLOF: Independent and Leisure: boating (ski boat); walking daily for ~1/4 mile  PATIENT GOALS: "To be able to drive an RV from [place] to [place] in October."   OBJECTIVE:   DIAGNOSTIC FINDINGS:  Lumbar x-ray 08/17/21: 5 lumbar vertebra. Hip and SI joints well-preserved. There is been a central laminectomy from L2-S1. Level disc degeneration present with no  evidence of anterolisthesis. Lumbar MRI 07/08/21: 1. Interval progression of degenerative changes at L2-3 which in  association with epidural lipomatosis resulting in severe narrowing of the thecal sac, moderate to severe right and severe left neural foraminal narrowing.  2. Postsurgical changes with interval resolution of high-grade spinal canal stenosis at L3-4 and L4-5 with persistent high-grade bilateral neural foraminal narrowing at these levels.  3. Mild right and moderate left neural foraminal narrowing at L1-2.  4. Moderate right and mild left neural foraminal narrowing at L5-S1.   Lumbar x-ray 10/01/18: L1-2, L2-3, L3-4 and L4-5 with DDD and disc narrowing and anterior spurs, no anterolisthesis, the neuroforamen are narrowed at L3-4 and L4-5 on lateral radiographs.  PATIENT SURVEYS:  FOTO Lumbar = 69; predicted = 70  SCREENING FOR RED FLAGS: Bowel or bladder incontinence: No Spinal tumors: No Cauda equina syndrome: No Compression fracture: No Abdominal aneurysm: No  COGNITION:  Overall cognitive status: Within functional limits for tasks assessed     SENSATION: WFL  MUSCLE LENGTH: Hamstrings: mod tight R>L ITB: mild/mod tight R>L Piriformis: mod tight  R>L Hip flexors: mod tight R>L Quads: mod tight R>L Heelcord: mod tight B  POSTURE: rounded shoulders, forward head, decreased lumbar lordosis, and decreased thoracic kyphosis  PALPATION:  Increased muscle tension in B lumbar paraspinals and R>L glutes piriformis but denies TTP. Lumbar spine hypomobility with CPAs but no pain reported.  LUMBAR ROM:   Active  AROM  Eval - 03/08/22  Flexion Hands to toes  Extension WFL  Right lateral flexion WFL  Left lateral flexion WFL  Right rotation [facility]  Left rotation WFL   (Blank rows = not tested)  LOWER EXTREMITY MMT:    MMT Right Eval 03/08/22 Left Eval 03/08/22 Right 04/07/22 Left 04/07/22  Right  04/11/22  Left 04/11/22  Hip flexion 4+ 4+ 4+ 4+ 4+ 4+  Hip extension 4 4 4+ 4+ 4+ 4+  Hip abduction 4- 4 4+ 4+ 4+ 4+  Hip adduction 4+ 4+ 4+ 4+ 4+ 4+  Hip internal rotation 4+ 4+   5 5  Hip external rotation 4- 4   5 5   Knee flexion 4+ 4+ 4 - limited by cramping 4+ 5 5  Knee extension 5 5   5 5   Ankle dorsiflexion 4 4   4+ 5  Ankle plantarflexion 5 5   5 5   Ankle inversion        Ankle eversion         (Blank rows = not tested)  LUMBAR SPECIAL TESTS:  Straight leg raise test: Negative and FABER test: Negative   TODAY'S TREATMENT   04/04/22 THERAPEUTIC EXERCISE: to improve flexibility, strength and mobility.  Verbal and tactile cues throughout for technique. Bike L2 x 8 min Verbal review of HEP - pt denies need for more formal review  THERAPEUTIC ACTIVITIES: FOTO: Lumbar = 94 MMT (refer to above MMT chart) Goal assessment  MANUAL THERAPY: To promote normalized muscle tension, improved flexibility, and reduced pain. Skilled palpation and monitoring of soft tissue during DN Trigger Point Dry-Needling  Treatment instructions: Expect mild to moderate muscle soreness. S/S of pneumothorax if dry needled over a lung field, and to seek immediate medical attention should they occur. Patient verbalized understanding of these  instructions and education. Patient Consent Given: Yes Education handout provided: Previously provided Muscles treated: B lumbar erector spinae and QL, R upper glute medius and minimus, and R proximal HS Electrical stimulation performed: No Parameters: N/A Treatment response/outcome: Twitch Response Elicited and Palpable Increase in Muscle Length SMT/DTM, manual TPR and pin & stretch to muscles addressed with DN   04/07/22 Therapeutic Exercise: Bike [id number] Assessed LE strength  Standing hip hinge x 10 Standing HS curl at counter x 10 - 2nd set progressed to GTB Standing hip ext with GTB x 10   Manual Therapy: STM to R hamstrings, focused on proximal ST/SM Passive hamstring stretch x 30 sec   04/04/22 THERAPEUTIC EXERCISE: to improve flexibility, strength and mobility.  Verbal and tactile cues throughout for technique. Bike L2 x 8 min Modified pigeon pose stretch x 30 sec bil 3 way flexion stretch with green Pball x 30 sec Seated lunge hip flexor stretch x 30 sec bil Standing lumbar flexion stretch x 30 holding onto sink Standing QL stretch at doorframe x 30 sec bil  MANUAL THERAPY: To promote normalized muscle tension, improved flexibility, and reduced pain. Skilled palpation and monitoring of soft tissue during DN Trigger Point Dry-Needling  -  performed exclusively by JAnnie Paras PT Treatment instructions: Expect mild to moderate muscle soreness. Patient verbalized understanding of these instructions and education. Patient Consent Given: Yes Education handout provided: Previously provided Muscles treated: B lumbar erector spinae and R QL Electrical stimulation performed: No Parameters: N/A Treatment response/outcome:  Twitch Response Elicited and Palpable Increase in Muscle Length STM/DTM and pin & stretch to muscles addressed with DN   PATIENT EDUCATION:  Education details: progress with PT, recommended frequency for ongoing HEP at discharge to prevent loss of gains  achieved with PT, and 30-day hold process Person educated: Patient Education method: Explanation Education comprehension: verbalized understanding   HOME EXERCISE PROGRAM: Access Code: [email or web address] URL: [email or web address] Date: 04/07/2022 Prepared by: Clarene Essex  Exercises - Supine Quadriceps Stretch with Strap on Table  - 2 x daily - 7 x weekly - 3 reps - 30 sec hold - Supine Hamstring Stretch with Strap  - 2 x daily - 7 x weekly - 3 reps - 30 sec hold - Supine ITB Stretch with Strap  - 2 x daily - 7 x weekly - 3 reps - 30 sec hold - Supine Figure 4 Piriformis Stretch  - 2 x daily - 7 x weekly - 3 reps - 30 sec hold - Supine Piriformis Stretch with Foot on Ground  - 2 x daily - 7 x weekly - 3 reps - 30 sec hold - Gastroc Stretch on Wall  - 2 x daily - 7 x weekly - 3 reps - 30 sec hold - Supine Sciatic Nerve Glide  - 2 x daily - 7 x weekly - 2 sets - 10 reps - 3 sec hold - Supine Posterior Pelvic Tilt  - 2 x daily - 7 x weekly - 2 sets - 10 reps - 5 sec hold - Hooklying Single Leg Bent Knee Fallouts with Resistance  - 1 x daily - 7 x weekly - 2 sets - 10 reps - 3 sec hold - Supine March with Resistance Band  - 1 x daily - 7 x weekly - 2 sets - 10 reps - 2-3 sec hold hold - Bridge with Resistance  - 1 x daily - 7 x weekly - 2 sets - 10 reps - 5 sec hold - [place] Deadlift- Hands on Hips  - 1 x daily - 3 x weekly - 2 sets - 10 reps - Standing Hamstring Curl with Resistance  - 1 x daily - 3 x weekly - 2 sets - 10 reps  Patient Education - Trigger Point Dry Needling  ASSESSMENT:  CLINICAL IMPRESSION: Khali is very pleased with his progress with PT and requested to transition to his HEP following today's session. Goal assessment revealing all goals met at this time, with good gains noted in LE MMT as well as reduction in abnormal muscle tension and spasm. He no longer notes any limitations in activity or sitting tolerance and has not noted any recent R foot drop/slap  although MMT reveals continued mild weakness in R DF as compared to L ankle. Final DN performed at pt request to address some lingering tightness - good twitch responses elicited resulting in palpable reduction in muscle tension. Nolin denies need for formal HEP review and feels confident with transition to his HEP at this time but would like to remain on hold for 30 days in the event that issues arise that would necessitate a return to PT.  OBJECTIVE IMPAIRMENTS decreased activity tolerance, decreased endurance, decreased knowledge of condition, decreased mobility, difficulty walking, decreased strength, increased fascial restrictions, impaired perceived functional ability, increased muscle spasms, impaired flexibility, improper body mechanics, postural dysfunction, and pain.   ACTIVITY LIMITATIONS sitting  PARTICIPATION LIMITATIONS: driving  PERSONAL FACTORS Age, Fitness, Past/current experiences, Time since onset of injury/illness/exacerbation, and 3+ comorbidities: L2-3, L3-4, L4-5  microscopic decompressive laminectomy; spinal stenosis, skin cancer, GERD, IBS, LE edema, HLD, LE varicose veins, anxiety, B glaucoma  are also affecting patient's functional outcome.   REHAB POTENTIAL: Excellent  CLINICAL DECISION MAKING: Stable/uncomplicated  EVALUATION COMPLEXITY: Low   GOALS: Goals reviewed with patient? Yes  SHORT TERM GOALS: Target date: 03/29/2022   Patient will be independent with initial HEP.  Baseline:  Goal status: MET  03/28/22  LONG TERM GOALS: Target date: 04/19/2022    Patient will be independent with advanced/ongoing HEP to improve outcomes and carryover.  Baseline:  Goal status: MET  04/11/22  2.  Patient will report 75% improvement in low back pain and R LE radicular pain to improve QOL.  Baseline:  Goal status: MET  03/31/22 - 80% per pt  3.  Patient to demonstrate ability to achieve and maintain good spinal alignment/posturing and body mechanics needed for daily  activities. Baseline:  Goal status: MET  04/11/22  4.  Patient will demonstrate improved B LE strength to >/= 4+ to 5/5 for improved stability and ease of mobility . Baseline: Refer to above MMT chart Goal status: MET  04/11/22  5.  Patient will report 2 on lumbar FOTO to demonstrate improved functional ability.  Baseline: 69 Goal status: MET  04/11/22 - FOTO = 94   6.  Patient will tolerate >/= 1.5 hrs of sitting while driving to improve tolerance for travel.  Baseline: 30-45 minute before severe pain causing him to have to stop driving  Goal status: MET  04/11/22 - pt feels like he can sit indefinitely at this point   7.  Patient will report ability to walk w/o noting R foot slap.  Baseline:  Goal status: MET  04/11/22 - Pt repots no longer noting any foot slap  PLAN: PT FREQUENCY: 2x/week  PT DURATION: 6 weeks  PLANNED INTERVENTIONS: Therapeutic exercises, Therapeutic activity, Neuromuscular re-education, Balance training, Gait training, Patient/Family education, Joint mobilization, Dry Needling, Electrical stimulation, Spinal mobilization, Cryotherapy, Moist heat, Taping, Traction, Ultrasound, Ionotophoresis 27m/ml Dexamethasone, Manual therapy, and Re-evaluation.  PLAN FOR NEXT SESSION:  transition to HEP + 30-day hold   JPercival Spanish PT 04/11/2022, 12:09 PM   PHYSICAL THERAPY DISCHARGE SUMMARY  Visits from Start of Care: 8  Current functional level related to goals / functional outcomes:   Refer to above clinical impression and goal assessment for status as of last visit on 04/11/2022. Patient was placed on hold for 30 days and has not needed to return to PT, therefore will proceed with discharge from PT for this episode.    Remaining deficits:   As above.   Education / Equipment:   HEP   Patient agrees to discharge. Patient goals were met. Patient is being discharged due to meeting the stated rehab goals.  JPercival Spanish PT, MPT 05/30/22, 9:48 AM  [facility] [place] [address]Phone: [phone]  Fax:  [phone]

## 2022-04-18 ENCOUNTER — Encounter: Payer: Medicare Other | Admitting: Physical Therapy

## 2022-05-02 DIAGNOSIS — D2372 Other benign neoplasm of skin of left lower limb, including hip: Secondary | ICD-10-CM | POA: Diagnosis not present

## 2022-05-02 DIAGNOSIS — D2261 Melanocytic nevi of right upper limb, including shoulder: Secondary | ICD-10-CM | POA: Diagnosis not present

## 2022-05-02 DIAGNOSIS — L570 Actinic keratosis: Secondary | ICD-10-CM | POA: Diagnosis not present

## 2022-05-02 DIAGNOSIS — L821 Other seborrheic keratosis: Secondary | ICD-10-CM | POA: Diagnosis not present

## 2022-05-08 DIAGNOSIS — M4807 Spinal stenosis, lumbosacral region: Secondary | ICD-10-CM | POA: Diagnosis not present

## 2022-05-08 DIAGNOSIS — M62830 Muscle spasm of back: Secondary | ICD-10-CM | POA: Diagnosis not present

## 2022-05-08 DIAGNOSIS — G894 Chronic pain syndrome: Secondary | ICD-10-CM | POA: Diagnosis not present

## 2022-05-08 DIAGNOSIS — G4700 Insomnia, unspecified: Secondary | ICD-10-CM | POA: Diagnosis not present

## 2022-05-19 ENCOUNTER — Telehealth: Payer: Self-pay | Admitting: Internal Medicine

## 2022-05-19 NOTE — Telephone Encounter (Signed)
Left message for patient to call back and schedule Medicare Annual Wellness Visit (AWV).   Please offer to do virtually or by telephone.  Left office number and my jabber [phone].  Last AWV:10/19/2017  Please schedule at anytime with Nurse Health Advisor.

## 2022-06-28 DIAGNOSIS — G894 Chronic pain syndrome: Secondary | ICD-10-CM | POA: Diagnosis not present

## 2022-06-28 DIAGNOSIS — M62830 Muscle spasm of back: Secondary | ICD-10-CM | POA: Diagnosis not present

## 2022-06-28 DIAGNOSIS — M4807 Spinal stenosis, lumbosacral region: Secondary | ICD-10-CM | POA: Diagnosis not present

## 2022-06-28 DIAGNOSIS — G4700 Insomnia, unspecified: Secondary | ICD-10-CM | POA: Diagnosis not present

## 2022-07-26 DIAGNOSIS — G894 Chronic pain syndrome: Secondary | ICD-10-CM | POA: Diagnosis not present

## 2022-07-26 DIAGNOSIS — G4700 Insomnia, unspecified: Secondary | ICD-10-CM | POA: Diagnosis not present

## 2022-07-26 DIAGNOSIS — M62830 Muscle spasm of back: Secondary | ICD-10-CM | POA: Diagnosis not present

## 2022-07-26 DIAGNOSIS — M4807 Spinal stenosis, lumbosacral region: Secondary | ICD-10-CM | POA: Diagnosis not present

## 2022-08-23 DIAGNOSIS — M4807 Spinal stenosis, lumbosacral region: Secondary | ICD-10-CM | POA: Diagnosis not present

## 2022-08-23 DIAGNOSIS — M62830 Muscle spasm of back: Secondary | ICD-10-CM | POA: Diagnosis not present

## 2022-08-23 DIAGNOSIS — G4700 Insomnia, unspecified: Secondary | ICD-10-CM | POA: Diagnosis not present

## 2022-08-23 DIAGNOSIS — G894 Chronic pain syndrome: Secondary | ICD-10-CM | POA: Diagnosis not present

## 2022-09-21 DIAGNOSIS — G4700 Insomnia, unspecified: Secondary | ICD-10-CM | POA: Diagnosis not present

## 2022-09-21 DIAGNOSIS — M4807 Spinal stenosis, lumbosacral region: Secondary | ICD-10-CM | POA: Diagnosis not present

## 2022-09-21 DIAGNOSIS — M62830 Muscle spasm of back: Secondary | ICD-10-CM | POA: Diagnosis not present

## 2022-09-21 DIAGNOSIS — G894 Chronic pain syndrome: Secondary | ICD-10-CM | POA: Diagnosis not present

## 2022-10-23 DIAGNOSIS — G4700 Insomnia, unspecified: Secondary | ICD-10-CM | POA: Diagnosis not present

## 2022-10-23 DIAGNOSIS — M62830 Muscle spasm of back: Secondary | ICD-10-CM | POA: Diagnosis not present

## 2022-10-23 DIAGNOSIS — G894 Chronic pain syndrome: Secondary | ICD-10-CM | POA: Diagnosis not present

## 2022-10-23 DIAGNOSIS — M4807 Spinal stenosis, lumbosacral region: Secondary | ICD-10-CM | POA: Diagnosis not present

## 2022-10-30 DIAGNOSIS — H40021 Open angle with borderline findings, high risk, right eye: Secondary | ICD-10-CM | POA: Diagnosis not present

## 2022-10-30 DIAGNOSIS — H25813 Combined forms of age-related cataract, bilateral: Secondary | ICD-10-CM | POA: Diagnosis not present

## 2022-10-30 DIAGNOSIS — H401121 Primary open-angle glaucoma, left eye, mild stage: Secondary | ICD-10-CM | POA: Diagnosis not present

## 2022-10-30 DIAGNOSIS — H35363 Drusen (degenerative) of macula, bilateral: Secondary | ICD-10-CM | POA: Diagnosis not present

## 2022-11-30 DIAGNOSIS — H401131 Primary open-angle glaucoma, bilateral, mild stage: Secondary | ICD-10-CM | POA: Diagnosis not present

## 2022-12-06 DIAGNOSIS — M62830 Muscle spasm of back: Secondary | ICD-10-CM | POA: Diagnosis not present

## 2022-12-06 DIAGNOSIS — G4700 Insomnia, unspecified: Secondary | ICD-10-CM | POA: Diagnosis not present

## 2022-12-06 DIAGNOSIS — M4807 Spinal stenosis, lumbosacral region: Secondary | ICD-10-CM | POA: Diagnosis not present

## 2022-12-06 DIAGNOSIS — G894 Chronic pain syndrome: Secondary | ICD-10-CM | POA: Diagnosis not present

## 2022-12-22 ENCOUNTER — Encounter: Payer: Self-pay | Admitting: Internal Medicine

## 2022-12-28 DIAGNOSIS — H401131 Primary open-angle glaucoma, bilateral, mild stage: Secondary | ICD-10-CM | POA: Diagnosis not present

## 2023-01-03 DIAGNOSIS — Z79891 Long term (current) use of opiate analgesic: Secondary | ICD-10-CM | POA: Diagnosis not present

## 2023-01-03 DIAGNOSIS — M4807 Spinal stenosis, lumbosacral region: Secondary | ICD-10-CM | POA: Diagnosis not present

## 2023-01-03 DIAGNOSIS — M62830 Muscle spasm of back: Secondary | ICD-10-CM | POA: Diagnosis not present

## 2023-01-03 DIAGNOSIS — G894 Chronic pain syndrome: Secondary | ICD-10-CM | POA: Diagnosis not present

## 2023-01-03 DIAGNOSIS — G4700 Insomnia, unspecified: Secondary | ICD-10-CM | POA: Diagnosis not present

## 2023-01-31 DIAGNOSIS — G894 Chronic pain syndrome: Secondary | ICD-10-CM | POA: Diagnosis not present

## 2023-01-31 DIAGNOSIS — G4700 Insomnia, unspecified: Secondary | ICD-10-CM | POA: Diagnosis not present

## 2023-01-31 DIAGNOSIS — M62830 Muscle spasm of back: Secondary | ICD-10-CM | POA: Diagnosis not present

## 2023-01-31 DIAGNOSIS — M4807 Spinal stenosis, lumbosacral region: Secondary | ICD-10-CM | POA: Diagnosis not present

## 2023-03-01 DIAGNOSIS — G894 Chronic pain syndrome: Secondary | ICD-10-CM | POA: Diagnosis not present

## 2023-03-01 DIAGNOSIS — M4807 Spinal stenosis, lumbosacral region: Secondary | ICD-10-CM | POA: Diagnosis not present

## 2023-03-01 DIAGNOSIS — G4700 Insomnia, unspecified: Secondary | ICD-10-CM | POA: Diagnosis not present

## 2023-03-01 DIAGNOSIS — M62830 Muscle spasm of back: Secondary | ICD-10-CM | POA: Diagnosis not present

## 2023-04-26 DIAGNOSIS — M62830 Muscle spasm of back: Secondary | ICD-10-CM | POA: Diagnosis not present

## 2023-04-26 DIAGNOSIS — M4807 Spinal stenosis, lumbosacral region: Secondary | ICD-10-CM | POA: Diagnosis not present

## 2023-04-26 DIAGNOSIS — G4700 Insomnia, unspecified: Secondary | ICD-10-CM | POA: Diagnosis not present

## 2023-04-26 DIAGNOSIS — G894 Chronic pain syndrome: Secondary | ICD-10-CM | POA: Diagnosis not present

## 2023-05-16 DIAGNOSIS — H25813 Combined forms of age-related cataract, bilateral: Secondary | ICD-10-CM | POA: Diagnosis not present

## 2023-05-16 DIAGNOSIS — H524 Presbyopia: Secondary | ICD-10-CM | POA: Diagnosis not present

## 2023-05-16 DIAGNOSIS — H04123 Dry eye syndrome of bilateral lacrimal glands: Secondary | ICD-10-CM | POA: Diagnosis not present

## 2023-05-16 DIAGNOSIS — H401131 Primary open-angle glaucoma, bilateral, mild stage: Secondary | ICD-10-CM | POA: Diagnosis not present

## 2023-05-17 ENCOUNTER — Encounter: Payer: Self-pay | Admitting: Internal Medicine

## 2023-05-24 DIAGNOSIS — M62830 Muscle spasm of back: Secondary | ICD-10-CM | POA: Diagnosis not present

## 2023-05-24 DIAGNOSIS — G894 Chronic pain syndrome: Secondary | ICD-10-CM | POA: Diagnosis not present

## 2023-05-24 DIAGNOSIS — M4807 Spinal stenosis, lumbosacral region: Secondary | ICD-10-CM | POA: Diagnosis not present

## 2023-05-24 DIAGNOSIS — G4700 Insomnia, unspecified: Secondary | ICD-10-CM | POA: Diagnosis not present

## 2023-06-13 DIAGNOSIS — H401131 Primary open-angle glaucoma, bilateral, mild stage: Secondary | ICD-10-CM | POA: Diagnosis not present

## 2023-06-18 ENCOUNTER — Encounter: Payer: Self-pay | Admitting: Internal Medicine

## 2023-07-05 DIAGNOSIS — M62830 Muscle spasm of back: Secondary | ICD-10-CM | POA: Diagnosis not present

## 2023-07-05 DIAGNOSIS — G4700 Insomnia, unspecified: Secondary | ICD-10-CM | POA: Diagnosis not present

## 2023-07-05 DIAGNOSIS — M4807 Spinal stenosis, lumbosacral region: Secondary | ICD-10-CM | POA: Diagnosis not present

## 2023-07-05 DIAGNOSIS — G894 Chronic pain syndrome: Secondary | ICD-10-CM | POA: Diagnosis not present

## 2023-07-20 ENCOUNTER — Ambulatory Visit (INDEPENDENT_AMBULATORY_CARE_PROVIDER_SITE_OTHER): Payer: Medicare Other | Admitting: Internal Medicine

## 2023-07-20 VITALS — BP 128/78 | HR 67 | Temp 98.2°F | Resp 16 | Ht 71.0 in | Wt 209.0 lb

## 2023-07-20 DIAGNOSIS — G250 Essential tremor: Secondary | ICD-10-CM | POA: Diagnosis not present

## 2023-07-20 DIAGNOSIS — E559 Vitamin D deficiency, unspecified: Secondary | ICD-10-CM | POA: Diagnosis not present

## 2023-07-20 DIAGNOSIS — N529 Male erectile dysfunction, unspecified: Secondary | ICD-10-CM

## 2023-07-20 DIAGNOSIS — R739 Hyperglycemia, unspecified: Secondary | ICD-10-CM | POA: Diagnosis not present

## 2023-07-20 DIAGNOSIS — G894 Chronic pain syndrome: Secondary | ICD-10-CM | POA: Insufficient documentation

## 2023-07-20 DIAGNOSIS — Z1211 Encounter for screening for malignant neoplasm of colon: Secondary | ICD-10-CM

## 2023-07-20 DIAGNOSIS — E785 Hyperlipidemia, unspecified: Secondary | ICD-10-CM

## 2023-07-20 MED ORDER — SILDENAFIL CITRATE 20 MG PO TABS: 60.0000 mg | ORAL_TABLET | Freq: Every day | ORAL | 6 refills | Status: AC | PRN

## 2023-07-20 NOTE — Patient Instructions (Addendum)
Vaccines I recommend: Flu shot Shingrix (shingles)  Please be sure to take vitamin D3: 2000 units daily.       GO TO THE LAB : Get the blood work     Next visit with me in about 4 months for a checkup     Please schedule it at the front desk

## 2023-07-20 NOTE — Progress Notes (Unsigned)
Subjective:    Patient ID: Tyrone Diaz, male    DOB: 1941-03-12, 82 y.o.   MRN: [id number]  DOS:  07/20/2023 Type of visit - description: Last visit 2 years ago.  Here for checkup.  CCS: Request a referral for a Cologuard.  Denies nausea vomiting.  No diarrhea or blood in the stools.  No abdominal pain.  For the last year has noted the right hand is slightly shaky with action.  Occasional difficulty writing. His wife has noted a quivering at the left upper lip.  Patient does not feel it. Denies slurred speech, focal deficits or diplopia.  Vitamin D: On supplements, needs blood work.     Review of Systems See above   Past Medical History:  Diagnosis Date   ANEMIA    ANXIETY    ARTHRITIS, CERVICAL SPINE    BCC (basal cell carcinoma of skin)    Cataracts, bilateral    GERD    GLAUCOMA, BILATERAL    History of stress test 12/2001   Showed normal perfusion with mild apical thinning.   History of stress test 11/2007   Post stress myocardial perfusion images show a normal pattern of perfusion in the all regions, the post stress left ventricular is normal in size. There is no scintigraphic evidence of inducible myocardial ischemia. The post EF 64%   Hx of echocardiogram 12/2001   Had previously shown mild MR as well as mild TR with normal systolic function.   Hx of echocardiogram 11/2006   5-years follow-up, this showed normal systolic as well as diastolic function. There was suggestion of flat mitral valve leaflet coaptation with borderline anterior mitral valve leaflet prolapse with mild associated mitral valve prolapse. He had mild TR. There was very mild aortic valve sclerosis without stenosis or insufficiency.   HYPERLIPIDEMIA    HYPOTHYROIDISM    PROCTITIS    TESTICULAR HYPOFUNCTION    Unspecified vitamin D deficiency    VARICOSE VEINS, LOWER EXTREMITIES     Past Surgical History:  Procedure Laterality Date   BACK SURGERY  2019   per pt, low back surgery   DENTAL  SURGERY     ear injury     piece of ear plug dislodged from ear   TONSILECTOMY, ADENOIDECTOMY, BILATERAL MYRINGOTOMY AND TUBES  09/11/1941   WISDOM TOOTH EXTRACTION     Social History   Socioeconomic History   Marital status: Married    Spouse name: Teofilo Pod   Number of children: 3   Years of education: Masters   Highest education level: Master's degree (e.g., MA, MS, MEng, MEd, MSW, MBA)  Occupational History   Occupation: retired 2010, 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  Tobacco Use   Smoking status: Former   Smokeless tobacco: Never   Tobacco comments:    quit 1970  Substance and Sexual Activity   Alcohol use: Yes    Alcohol/week: 0.0 standard drinks of alcohol    Comment: wine or liquer 2 glasses per night   Drug use: No   Sexual activity: Yes  Other Topics Concern   Not on file  Social History Narrative   Household: pt and wife   Lost older child years ago   Social Determinants of Health   Financial Resource Strain: Low Risk  (07/20/2023)   Overall Financial Resource Strain (CARDIA)    Difficulty of Paying Living Expenses: Not hard at all  Food Insecurity: No Food Insecurity (07/20/2023)   Hunger Vital Sign    Worried  About Running Out of Food in the Last Year: Never true    Ran Out of Food in the Last Year: Never true  Transportation Needs: No Transportation Needs (07/20/2023)   PRAPARE - Administrator, Civil Service (Medical): No    Lack of Transportation (Non-Medical): No  Physical Activity: Sufficiently Active (07/20/2023)   Exercise Vital Sign    Days of Exercise per Week: 7 days    Minutes of Exercise per Session: 30 min  Stress: No Stress Concern Present (07/20/2023)   [employer] of Occupational Health - Occupational Stress Questionnaire    Feeling of Stress : Only a little  Social Connections: Unknown (07/20/2023)   Social Connection and Isolation Panel [NHANES]    Frequency of Communication with Friends and Family: Patient declined     Frequency of Social Gatherings with Friends and Family: Patient declined    Attends Religious Services: Patient declined    Database administrator or Organizations: No    Attends Engineer, structural: Not on file    Marital Status: Married  Catering manager Violence: Not on file     Current Outpatient Medications  Medication Instructions   cholecalciferol (VITAMIN D) 1,000 Units, Daily   diazepam (VALIUM) 5 mg, Oral, Daily PRN   fluticasone (FLONASE) 50 MCG/ACT nasal spray 1 spray, Each Nare, Daily   HYDROcodone-acetaminophen (NORCO/VICODIN) 5-325 MG tablet 1 tablet, Oral, 2 times daily, Per Pain Management Dr. Manon Hilding    latanoprost (XALATAN) 0.005 % ophthalmic solution 1 drop, Both Eyes, Daily at bedtime   sildenafil (REVATIO) 60-80 mg, Oral, Daily PRN   timolol (BETIMOL) 0.5 % ophthalmic solution 1 drop, Both Eyes, 2 times daily       Objective:   Physical Exam BP 128/78   Pulse 67   Temp 98.2 F (36.8 C) (Oral)   Resp 16   Ht 5\' 11"  (1.803 m)   Wt 209 lb (94.8 kg)   SpO2 92%   BMI 29.15 kg/m  General:   Well developed, NAD, BMI noted. HEENT:  Normocephalic . Face symmetric, atraumatic Lungs:  CTA B Normal respiratory effort, no intercostal retractions, no accessory muscle use. Heart: RRR,  no murmur.  Lower extremities: no pretibial edema bilaterally  Skin: Not pale. Not jaundice Neurologic:  alert & oriented X3.  Speech normal, gait appropriate for age and unassisted EOMI.  Face symmetric.  Tongue midline.  Motor symmetric.  DTRs symmetric, slightly decreased on the lower extremities. Very subtle Fine tremor with action noted on the right hand.    Psych--  Cognition and judgment appear intact.  Cooperative with normal attention span and concentration.  Behavior appropriate. No anxious or depressed appearing.      Assessment     ASSESSMENT Prediabetes Dyslipidemia Anxiety - on valium rx by pcp, get UDS @ pain mngmt  GI: GERD, IBS, several  EGDs before  Allergies : Peanuts, pollen, sunlight, trees,(h/o anal pruritus stopped after peanuts dc from diet) Hypogonadism    ED, decreased libido MSK: spinal stenosis, Dr Manon Hilding.  Microdiscectomy 10-2018 Glaucoma Varicose veins H/o goiter, remote bx H/o low vit d H/p palpitations, normal echo 2008, (-) stress test 2009 H/o Chest pain: Myoview  04/27/2020 neg.     PLAN: LOV 2022 Prediabetes: Will check A1c and BMP. Dyslipidemia: Diet controlled, check FLP. Essential tremor, right hand: With no other neurology symptoms except for occasional spasm at the left upper lip.  Suspect essential tremor, recommend observation for now.  Check TSH. ED: Refill  sildenafil Vitamin D deficiency: On supplements, unclear dose.  Recommend 2000 units daily.  Check labs. Preventive care: Declined flu shot, had a reaction to the COVID-vaccine will not take. CCS: No GI symptoms, okay for Cologuard. RTC 4 months.

## 2023-07-21 ENCOUNTER — Encounter: Payer: Self-pay | Admitting: Internal Medicine

## 2023-07-21 LAB — BASIC METABOLIC PANEL WITH GFR
BUN: 15 mg/dL (ref 7–25)
CO2: 28 mmol/L (ref 20–32)
Calcium: 10.1 mg/dL (ref 8.6–10.3)
Chloride: 104 mmol/L (ref 98–110)
Creat: 0.93 mg/dL (ref 0.70–1.22)
Glucose, Bld: 104 mg/dL — ABNORMAL HIGH (ref 65–99)
Potassium: 5.2 mmol/L (ref 3.5–5.3)
Sodium: 141 mmol/L (ref 135–146)

## 2023-07-21 LAB — HEMOGLOBIN A1C
Hgb A1c MFr Bld: 5.9 %{Hb} — ABNORMAL HIGH (ref <5.7)
Mean Plasma Glucose: 123 mg/dL
eAG (mmol/L): 6.8 mmol/L

## 2023-07-21 LAB — LIPID PANEL
Cholesterol: 180 mg/dL (ref <200)
HDL: 49 mg/dL (ref >=40)
LDL Cholesterol (Calc): 105 mg/dL — ABNORMAL HIGH
Non-HDL Cholesterol (Calc): 131 mg/dL — ABNORMAL HIGH (ref <130)
Total CHOL/HDL Ratio: 3.7 (calc) (ref <5.0)
Triglycerides: 145 mg/dL (ref <150)

## 2023-07-21 LAB — TSH: TSH: 0.23 m[IU]/L — ABNORMAL LOW (ref 0.40–4.50)

## 2023-07-21 LAB — VITAMIN D 25 HYDROXY (VIT D DEFICIENCY, FRACTURES): Vit D, 25-Hydroxy: 44 ng/mL (ref 30–100)

## 2023-07-21 NOTE — Assessment & Plan Note (Signed)
LOV 2022 Prediabetes: Will check A1c and BMP. Dyslipidemia: Diet controlled, check FLP. Essential tremor, right hand: With no other neurology symptoms except for occasional spasm at the left upper lip.  Suspect essential tremor, recommend observation for now.  Check TSH. ED: Refill sildenafil Vitamin D deficiency: On supplements, unclear dose.  Recommend 2000 units daily.  Check labs. Preventive care: Declined flu shot, had a reaction to the COVID-vaccine will not take. CCS: No GI symptoms, okay for Cologuard. RTC 4 months.

## 2023-07-24 DIAGNOSIS — M4807 Spinal stenosis, lumbosacral region: Secondary | ICD-10-CM | POA: Diagnosis not present

## 2023-07-24 DIAGNOSIS — M62830 Muscle spasm of back: Secondary | ICD-10-CM | POA: Diagnosis not present

## 2023-07-24 DIAGNOSIS — G894 Chronic pain syndrome: Secondary | ICD-10-CM | POA: Diagnosis not present

## 2023-07-24 DIAGNOSIS — G4700 Insomnia, unspecified: Secondary | ICD-10-CM | POA: Diagnosis not present

## 2023-07-26 DIAGNOSIS — Z1211 Encounter for screening for malignant neoplasm of colon: Secondary | ICD-10-CM | POA: Diagnosis not present

## 2023-07-31 DIAGNOSIS — L720 Epidermal cyst: Secondary | ICD-10-CM | POA: Diagnosis not present

## 2023-07-31 DIAGNOSIS — D2261 Melanocytic nevi of right upper limb, including shoulder: Secondary | ICD-10-CM | POA: Diagnosis not present

## 2023-07-31 DIAGNOSIS — D1801 Hemangioma of skin and subcutaneous tissue: Secondary | ICD-10-CM | POA: Diagnosis not present

## 2023-07-31 DIAGNOSIS — Z85828 Personal history of other malignant neoplasm of skin: Secondary | ICD-10-CM | POA: Diagnosis not present

## 2023-07-31 DIAGNOSIS — L820 Inflamed seborrheic keratosis: Secondary | ICD-10-CM | POA: Diagnosis not present

## 2023-07-31 DIAGNOSIS — L821 Other seborrheic keratosis: Secondary | ICD-10-CM | POA: Diagnosis not present

## 2023-07-31 DIAGNOSIS — D225 Melanocytic nevi of trunk: Secondary | ICD-10-CM | POA: Diagnosis not present

## 2023-08-02 LAB — COLOGUARD: COLOGUARD: NEGATIVE

## 2023-10-18 DIAGNOSIS — G4700 Insomnia, unspecified: Secondary | ICD-10-CM | POA: Diagnosis not present

## 2023-10-18 DIAGNOSIS — G894 Chronic pain syndrome: Secondary | ICD-10-CM | POA: Diagnosis not present

## 2023-10-18 DIAGNOSIS — M4807 Spinal stenosis, lumbosacral region: Secondary | ICD-10-CM | POA: Diagnosis not present

## 2023-10-18 DIAGNOSIS — M62830 Muscle spasm of back: Secondary | ICD-10-CM | POA: Diagnosis not present

## 2023-11-20 ENCOUNTER — Encounter: Payer: Self-pay | Admitting: Internal Medicine

## 2023-11-20 ENCOUNTER — Ambulatory Visit (INDEPENDENT_AMBULATORY_CARE_PROVIDER_SITE_OTHER): Payer: Medicare Other | Admitting: *Deleted

## 2023-11-20 ENCOUNTER — Ambulatory Visit (INDEPENDENT_AMBULATORY_CARE_PROVIDER_SITE_OTHER): Payer: Medicare Other | Admitting: Internal Medicine

## 2023-11-20 VITALS — BP 129/56 | HR 60 | Ht 71.0 in | Wt 210.8 lb

## 2023-11-20 VITALS — BP 129/56 | HR 60 | Temp 98.1°F | Resp 16 | Ht 71.0 in | Wt 210.8 lb

## 2023-11-20 DIAGNOSIS — R0683 Snoring: Secondary | ICD-10-CM

## 2023-11-20 DIAGNOSIS — Z0189 Encounter for other specified special examinations: Secondary | ICD-10-CM

## 2023-11-20 DIAGNOSIS — E291 Testicular hypofunction: Secondary | ICD-10-CM | POA: Diagnosis not present

## 2023-11-20 DIAGNOSIS — Z0000 Encounter for general adult medical examination without abnormal findings: Secondary | ICD-10-CM

## 2023-11-20 NOTE — Progress Notes (Signed)
 Subjective:   Tyrone Diaz is a 83 y.o. male who presents for Medicare Annual/Subsequent preventive examination.  Visit Complete: In person  Patient Medicare AWV questionnaire was completed by the patient on 11/19/23; I have confirmed that all information answered by patient is correct and no changes since this date.  Cardiac Risk Factors include: advanced age (>55men, >92 women);dyslipidemia;male gender     Objective:    Today's Vitals   11/20/23 1302  BP: (!) 129/56  Pulse: 60  Weight: 210 lb 12.8 oz (95.6 kg)  Height: 5\' 11"  (1.803 m)   Body mass index is 29.4 kg/m.     11/20/2023    1:05 PM 03/08/2022   10:18 AM 09/21/2021    4:24 PM 03/04/2019    3:02 PM 10/19/2017   10:20 AM  Advanced Directives  Does Patient Have a Medical Advance Directive? Yes Yes Yes Yes Yes  Type of Estate agent of [place];Living will Healthcare Power of [place];Living will  Healthcare Power of [place];Living will Healthcare Power of [place];Living will  Does patient want to make changes to medical advance directive? No - Patient declined No - Patient declined No - Patient declined No - Patient declined No - Patient declined  Copy of Healthcare Power of Attorney in Chart? No - copy requested No - copy requested  No - copy requested No - copy requested    Current Medications (verified) Outpatient Encounter Medications as of 11/20/2023  Medication Sig   cholecalciferol (VITAMIN D) 1000 units tablet Take 1,000 Units by mouth daily. (Patient not taking: Reported on 07/20/2023)   diazepam (VALIUM) 5 MG tablet Take 1 tablet (5 mg total) by mouth daily as needed for anxiety.   fluticasone (FLONASE) 50 MCG/ACT nasal spray Place 1 spray into both nostrils daily. (Patient not taking: Reported on 07/20/2023)   HYDROcodone-acetaminophen (NORCO/VICODIN) 5-325 MG tablet Take 1 tablet by mouth 2 (two) times daily. Per Pain Management Dr. Manon Hilding   latanoprost (XALATAN) 0.005 % ophthalmic  solution Place 1 drop into both eyes at bedtime.   sildenafil (REVATIO) 20 MG tablet Take 3-4 tablets (60-80 mg total) by mouth daily as needed.   timolol (BETIMOL) 0.5 % ophthalmic solution Place 1 drop into both eyes 2 (two) times daily.   No facility-administered encounter medications on file as of 11/20/2023.    Allergies (verified) Peanut-containing drug products and Wasp venom   History: Past Medical History:  Diagnosis Date   Allergy 1997   ANEMIA    ANXIETY    ARTHRITIS, CERVICAL SPINE    BCC (basal cell carcinoma of skin)    Cataracts, bilateral    GERD    Glaucoma 1997   GLAUCOMA, BILATERAL    Heart murmur 1975   History of stress test 12/2001   Showed normal perfusion with mild apical thinning.   History of stress test 11/2007   Post stress myocardial perfusion images show a normal pattern of perfusion in the all regions, the post stress left ventricular is normal in size. There is no scintigraphic evidence of inducible myocardial ischemia. The post EF 64%   Hx of echocardiogram 12/2001   Had previously shown mild MR as well as mild TR with normal systolic function.   Hx of echocardiogram 11/2006   5-years follow-up, this showed normal systolic as well as diastolic function. There was suggestion of flat mitral valve leaflet coaptation with borderline anterior mitral valve leaflet prolapse with mild associated mitral valve prolapse. He had mild TR. There was  very mild aortic valve sclerosis without stenosis or insufficiency.   HYPERLIPIDEMIA    HYPOTHYROIDISM    PROCTITIS    TESTICULAR HYPOFUNCTION    Unspecified vitamin D deficiency    VARICOSE VEINS, LOWER EXTREMITIES    Past Surgical History:  Procedure Laterality Date   BACK SURGERY  2019   per pt, low back surgery   DENTAL SURGERY     ear injury     piece of ear plug dislodged from ear   SPINE SURGERY  2019   TONSILECTOMY, ADENOIDECTOMY, BILATERAL MYRINGOTOMY AND TUBES  09/11/1941   TUBAL LIGATION  1965    WISDOM TOOTH EXTRACTION     Family History  Problem Relation Age of Onset   Arthritis Mother    Heart disease Mother        CHF   Hypertension Mother    Arthritis Father    Stroke Father    Hypertension Father    Colon cancer Neg Hx    Pancreatic cancer Neg Hx    Stomach cancer Neg Hx    Prostate cancer Neg Hx    Social History   Socioeconomic History   Marital status: Married    Spouse name: Teofilo Pod   Number of children: 3   Years of education: Masters   Highest education level: Master's degree (e.g., MA, MS, MEng, MEd, MSW, MBA)  Occupational History   Occupation: retired 2010, 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  Tobacco Use   Smoking status: Former   Smokeless tobacco: Never   Tobacco comments:    quit 1970  Substance and Sexual Activity   Alcohol use: Not Currently    Comment: wine or liquer 2 glasses per night   Drug use: No   Sexual activity: Yes    Birth control/protection: None  Other Topics Concern   Not on file  Social History Narrative   Household: pt and wife   Lost older child years ago   Social Drivers of Corporate investment banker Strain: Low Risk  (11/20/2023)   Overall Financial Resource Strain (CARDIA)    Difficulty of Paying Living Expenses: Not hard at all  Food Insecurity: No Food Insecurity (11/20/2023)   Hunger Vital Sign    Worried About Running Out of Food in the Last Year: Never true    Ran Out of Food in the Last Year: Never true  Transportation Needs: No Transportation Needs (11/20/2023)   PRAPARE - Administrator, Civil Service (Medical): No    Lack of Transportation (Non-Medical): No  Physical Activity: Sufficiently Active (11/20/2023)   Exercise Vital Sign    Days of Exercise per Week: 7 days    Minutes of Exercise per Session: 30 min  Stress: No Stress Concern Present (11/20/2023)   [employer] of Occupational Health - Occupational Stress Questionnaire    Feeling of Stress : Not at all  Social Connections:  Socially Isolated (11/20/2023)   Social Connection and Isolation Panel [NHANES]    Frequency of Communication with Friends and Family: Once a week    Frequency of Social Gatherings with Friends and Family: Once a week    Attends Religious Services: Never    Database administrator or Organizations: No    Attends Engineer, structural: Never    Marital Status: Married    Tobacco Counseling Counseling given: Not Answered Tobacco comments: quit 1970   Clinical Intake:  Pre-visit preparation completed: Yes  Pain : No/denies pain  BMI -  recorded: 29.4 Nutritional Status: BMI 25 -29 Overweight Diabetes: No  How often do you need to have someone help you when you read instructions, pamphlets, or other written materials from your doctor or pharmacy?: 1 - Never  Interpreter Needed?: No  Information entered by :: Donne Anon, CMA   Activities of Daily Living    11/19/2023   11:15 AM  In your present state of health, do you have any difficulty performing the following activities:  Hearing? 0  Vision? 0  Difficulty concentrating or making decisions? 0  Walking or climbing stairs? 0  Dressing or bathing? 0  Doing errands, shopping? 0  Preparing Food and eating ? N  Using the Toilet? N  In the past six months, have you accidently leaked urine? N  Do you have problems with loss of bowel control? N  Managing your Medications? N  Managing your Finances? N  Housekeeping or managing your Housekeeping? N    Patient Care Team: Wanda Plump, MD as PCP - General (Internal Medicine) Abelino Derrick, PA-C (Inactive) as Physician Assistant (Cardiology) Louis Meckel, MD (Inactive) as Consulting Physician (Gastroenterology) Mateo Flow, MD as Consulting Physician (Ophthalmology) Verdon Cummins, MD as Referring Physician (Physical Medicine and Rehabilitation) [place], Amy, MD as Consulting Physician (Dermatology) Day, Dannielle Karvonen, [facility] (Inactive) as Pharmacist  (Pharmacist)  Indicate any recent Medical Services you may have received from other than [facility] providers in the past year (date may be approximate).     Assessment:   This is a routine wellness examination for Torell.  Hearing/Vision screen No results found.   Goals Addressed   None    Depression Screen    11/20/2023    1:09 PM 07/20/2023    1:50 PM 02/22/2021   10:41 AM 07/19/2020   10:55 AM 10/19/2017   10:21 AM 10/17/2016   11:07 AM 10/15/2015   10:46 AM  PHQ 2/9 Scores  PHQ - 2 Score 0 0 0 0 0 0 0    Fall Risk    11/19/2023   11:15 AM 07/20/2023    1:50 PM 02/22/2021   10:40 AM 07/19/2020   10:57 AM 10/19/2017   10:21 AM  Fall Risk   Falls in the past year? 0 0 0 0 No  Number falls in past yr: 0 0 0 0   Injury with Fall? 0 0 0 0   Risk for fall due to : No Fall Risks      Follow up Falls evaluation completed Falls evaluation completed Falls evaluation completed Falls evaluation completed     MEDICARE RISK AT HOME: Medicare Risk at Home Any stairs in or around the home?: (Patient-Rptd) Yes If so, are there any without handrails?: (Patient-Rptd) No Home free of loose throw rugs in walkways, pet beds, electrical cords, etc?: (Patient-Rptd) Yes Adequate lighting in your home to reduce risk of falls?: (Patient-Rptd) Yes Life alert?: (Patient-Rptd) No Use of a cane, walker or w/c?: (Patient-Rptd) No Grab bars in the bathroom?: (Patient-Rptd) No Shower chair or bench in shower?: (Patient-Rptd) Yes Elevated toilet seat or a handicapped toilet?: (Patient-Rptd) No  TIMED UP AND GO:  Was the test performed?  Yes  Length of time to ambulate 10 feet: 6 sec Gait steady and fast without use of assistive device    Cognitive Function:        11/20/2023    1:10 PM  6CIT Screen  What Year? 0 points  What month? 0 points  What time? 0 points  Count  back from 20 0 points  Months in reverse 0 points  Repeat phrase 6 points  Total Score 6 points     Immunizations Immunization History  Administered Date(s) Administered   Influenza Split 08/05/2012   Influenza, High Dose Seasonal PF 08/31/2015, 08/01/2016, 06/26/2017   Influenza,inj,Quad PF,6+ Mos 05/28/2013, 10/13/2014   Influenza-Unspecified 06/11/2020   PFIZER(Purple Top)SARS-COV-2 Vaccination 11/27/2019, 12/22/2019   Pneumococcal Conjugate-13 10/13/2014   Pneumococcal Polysaccharide-23 04/28/2010   Td 10/17/2016   Tdap 01/09/2006   Zoster, Live 04/28/2010    TDAP status: Up to date  Flu Vaccine status: Declined, Education has been provided regarding the importance of this vaccine but patient still declined. Advised may receive this vaccine at local pharmacy or Health Dept. Aware to provide a copy of the vaccination record if obtained from local pharmacy or Health Dept. Verbalized acceptance and understanding.  Pneumococcal vaccine status: Up to date  Covid-19 vaccine status: Declined, Education has been provided regarding the importance of this vaccine but patient still declined. Advised may receive this vaccine at local pharmacy or Health Dept.or vaccine clinic. Aware to provide a copy of the vaccination record if obtained from local pharmacy or Health Dept. Verbalized acceptance and understanding.  Qualifies for Shingles Vaccine? Yes   Zostavax completed Yes   Shingrix Completed?: No.    Education has been provided regarding the importance of this vaccine. Patient has been advised to call insurance company to determine out of pocket expense if they have not yet received this vaccine. Advised may also receive vaccine at local pharmacy or Health Dept. Verbalized acceptance and understanding.  Screening Tests Health Maintenance  Topic Date Due   Zoster Vaccines- Shingrix (1 of 2) 05/10/1960   Medicare Annual Wellness (AWV)  10/19/2018   INFLUENZA VACCINE  12/10/2023 (Originally 04/12/2023)   DTaP/Tdap/Td (3 - Td or Tdap) 10/17/2026   Pneumonia Vaccine 29+ Years old   Completed   HPV VACCINES  Aged Out   COVID-19 Vaccine  Discontinued    Health Maintenance  Health Maintenance Due  Topic Date Due   Zoster Vaccines- Shingrix (1 of 2) 05/10/1960   Medicare Annual Wellness (AWV)  10/19/2018    Colorectal cancer screening: No longer required.   Lung Cancer Screening: (Low Dose CT Chest recommended if Age 39-80 years, 20 pack-year currently smoking OR have quit w/in 15years.) does not qualify.   Additional Screening:  Hepatitis C Screening: does not qualify  Vision Screening: Recommended annual ophthalmology exams for early detection of glaucoma and other disorders of the eye. Is the patient up to date with their annual eye exam?  Yes  Who is the provider or what is the name of the office in which the patient attends annual eye exams? Dr. Zenaida Niece If pt is not established with a provider, would they like to be referred to a provider to establish care? No .   Dental Screening: Recommended annual dental exams for proper oral hygiene  Diabetic Foot Exam: N/a  Community Resource Referral / Chronic Care Management: CRR required this visit?  No   CCM required this visit?  No     Plan:     I have personally reviewed and noted the following in the patient's chart:   Medical and social history Use of alcohol, tobacco or illicit drugs  Current medications and supplements including opioid prescriptions. Patient is currently taking opioid prescriptions. Information provided to patient regarding non-opioid alternatives. Patient advised to discuss non-opioid treatment plan with their provider. Functional ability and status Nutritional  status Physical activity Advanced directives List of other physicians Hospitalizations, surgeries, and ER visits in previous 12 months Vitals Screenings to include cognitive, depression, and falls Referrals and appointments  In addition, I have reviewed and discussed with patient certain preventive protocols, quality  metrics, and best practice recommendations. A written personalized care plan for preventive services as well as general preventive health recommendations were provided to patient.     Donne Anon, CMA   11/20/2023   After Visit Summary: (In Person-Declined) Patient declined AVS at this time.  Nurse Notes: None

## 2023-11-20 NOTE — Patient Instructions (Signed)
 [name] , Thank you for taking time to come for your Medicare Wellness Visit. I appreciate your ongoing commitment to your health goals. Please review the following plan we discussed and let me know if I can assist you in the future.     This is a list of the screening recommended for you and due dates:  Health Maintenance  Topic Date Due   Zoster (Shingles) Vaccine (1 of 2) 05/10/1960   Flu Shot  12/10/2023*   Medicare Annual Wellness Visit  11/19/2024   DTaP/Tdap/Td vaccine (3 - Td or Tdap) 10/17/2026   Pneumonia Vaccine  Completed   HPV Vaccine  Aged Out   COVID-19 Vaccine  Discontinued  *Topic was postponed. The date shown is not the original due date.    Next appointment: Follow up in one year for your annual wellness visit.   Preventive Care 15 Years and Older, Male Preventive care refers to lifestyle choices and visits with your health care provider that can promote health and wellness. What does preventive care include? A yearly physical exam. This is also called an annual well check. Dental exams once or twice a year. Routine eye exams. Ask your health care provider how often you should have your eyes checked. Personal lifestyle choices, including: Daily care of your teeth and gums. Regular physical activity. Eating a healthy diet. Avoiding tobacco and drug use. Limiting alcohol use. Practicing safe sex. Taking low doses of aspirin every day. Taking vitamin and mineral supplements as recommended by your health care provider. What happens during an annual well check? The services and screenings done by your health care provider during your annual well check will depend on your age, overall health, lifestyle risk factors, and family history of disease. Counseling  Your health care provider may ask you questions about your: Alcohol use. Tobacco use. Drug use. Emotional well-being. Home and relationship well-being. Sexual activity. Eating habits. History of  falls. Memory and ability to understand (cognition). Work and work Astronomer. Screening  You may have the following tests or measurements: Height, weight, and BMI. Blood pressure. Lipid and cholesterol levels. These may be checked every 5 years, or more frequently if you are over 106 years old. Skin check. Lung cancer screening. You may have this screening every year starting at age 50 if you have a 30-pack-year history of smoking and currently smoke or have quit within the past 15 years. Fecal occult blood test (FOBT) of the stool. You may have this test every year starting at age 87. Flexible sigmoidoscopy or colonoscopy. You may have a sigmoidoscopy every 5 years or a colonoscopy every 10 years starting at age 72. Prostate cancer screening. Recommendations will vary depending on your family history and other risks. Hepatitis C blood test. Hepatitis B blood test. Sexually transmitted disease (STD) testing. Diabetes screening. This is done by checking your blood sugar (glucose) after you have not eaten for a while (fasting). You may have this done every 1-3 years. Abdominal aortic aneurysm (AAA) screening. You may need this if you are a current or former smoker. Osteoporosis. You may be screened starting at age 45 if you are at high risk. Talk with your health care provider about your test results, treatment options, and if necessary, the need for more tests. Vaccines  Your health care provider may recommend certain vaccines, such as: Influenza vaccine. This is recommended every year. Tetanus, diphtheria, and acellular pertussis (Tdap, Td) vaccine. You may need a Td booster every 10 years. Zoster vaccine.  You may need this after age 72. Pneumococcal 13-valent conjugate (PCV13) vaccine. One dose is recommended after age 88. Pneumococcal polysaccharide (PPSV23) vaccine. One dose is recommended after age 74. Talk to your health care provider about which screenings and vaccines you need and  how often you need them. This information is not intended to replace advice given to you by your health care provider. Make sure you discuss any questions you have with your health care provider. Document Released: 09/24/2015 Document Revised: 05/17/2016 Document Reviewed: 06/29/2015 Elsevier Interactive Patient Education  2017 [employer].  Fall Prevention in the Home Falls can cause injuries. They can happen to people of all ages. There are many things you can do to make your home safe and to help prevent falls. What can I do on the outside of my home? Regularly fix the edges of walkways and driveways and fix any cracks. Remove anything that might make you trip as you walk through a door, such as a raised step or threshold. Trim any bushes or trees on the path to your home. Use bright outdoor lighting. Clear any walking paths of anything that might make someone trip, such as rocks or tools. Regularly check to see if handrails are loose or broken. Make sure that both sides of any steps have handrails. Any raised decks and porches should have guardrails on the edges. Have any leaves, snow, or ice cleared regularly. Use sand or salt on walking paths during winter. Clean up any spills in your garage right away. This includes oil or grease spills. What can I do in the bathroom? Use night lights. Install grab bars by the toilet and in the tub and shower. Do not use towel bars as grab bars. Use non-skid mats or decals in the tub or shower. If you need to sit down in the shower, use a plastic, non-slip stool. Keep the floor dry. Clean up any water that spills on the floor as soon as it happens. Remove soap buildup in the tub or shower regularly. Attach bath mats securely with double-sided non-slip rug tape. Do not have throw rugs and other things on the floor that can make you trip. What can I do in the bedroom? Use night lights. Make sure that you have a light by your bed that is easy to  reach. Do not use any sheets or blankets that are too big for your bed. They should not hang down onto the floor. Have a firm chair that has side arms. You can use this for support while you get dressed. Do not have throw rugs and other things on the floor that can make you trip. What can I do in the kitchen? Clean up any spills right away. Avoid walking on wet floors. Keep items that you use a lot in easy-to-reach places. If you need to reach something above you, use a strong step stool that has a grab bar. Keep electrical cords out of the way. Do not use floor polish or wax that makes floors slippery. If you must use wax, use non-skid floor wax. Do not have throw rugs and other things on the floor that can make you trip. What can I do with my stairs? Do not leave any items on the stairs. Make sure that there are handrails on both sides of the stairs and use them. Fix handrails that are broken or loose. Make sure that handrails are as long as the stairways. Check any carpeting to make sure that it is  firmly attached to the stairs. Fix any carpet that is loose or worn. Avoid having throw rugs at the top or bottom of the stairs. If you do have throw rugs, attach them to the floor with carpet tape. Make sure that you have a light switch at the top of the stairs and the bottom of the stairs. If you do not have them, ask someone to add them for you. What else can I do to help prevent falls? Wear shoes that: Do not have high heels. Have rubber bottoms. Are comfortable and fit you well. Are closed at the toe. Do not wear sandals. If you use a stepladder: Make sure that it is fully opened. Do not climb a closed stepladder. Make sure that both sides of the stepladder are locked into place. Ask someone to hold it for you, if possible. Clearly mark and make sure that you can see: Any grab bars or handrails. First and last steps. Where the edge of each step is. Use tools that help you move  around (mobility aids) if they are needed. These include: Canes. Walkers. Scooters. Crutches. Turn on the lights when you go into a dark area. Replace any light bulbs as soon as they burn out. Set up your furniture so you have a clear path. Avoid moving your furniture around. If any of your floors are uneven, fix them. If there are any pets around you, be aware of where they are. Review your medicines with your doctor. Some medicines can make you feel dizzy. This can increase your chance of falling. Ask your doctor what other things that you can do to help prevent falls. This information is not intended to replace advice given to you by your health care provider. Make sure you discuss any questions you have with your health care provider. Document Released: 06/24/2009 Document Revised: 02/03/2016 Document Reviewed: 10/02/2014 Elsevier Interactive Patient Education  2017 [employer].

## 2023-11-20 NOTE — Progress Notes (Unsigned)
 Subjective:    Patient ID: Tyrone Diaz, male    DOB: 1940/09/28, 83 y.o.   MRN: [id number]  DOS:  11/20/2023 Type of visit - description: Follow-up  Since the last office visit feels well. Request testosterone checked because he is feeling very sleepy and drowsy if he is not active. This is started more than a year ago. He admits to snoring. Has chronic ED on medications. Denies any major problems with muscle mass loss. Libido is decreased.   Review of Systems See above   Past Medical History:  Diagnosis Date   Allergy 1997   ANEMIA    ANXIETY    ARTHRITIS, CERVICAL SPINE    BCC (basal cell carcinoma of skin)    Cataracts, bilateral    GERD    Glaucoma 1997   GLAUCOMA, BILATERAL    Heart murmur 1975   History of stress test 12/2001   Showed normal perfusion with mild apical thinning.   History of stress test 11/2007   Post stress myocardial perfusion images show a normal pattern of perfusion in the all regions, the post stress left ventricular is normal in size. There is no scintigraphic evidence of inducible myocardial ischemia. The post EF 64%   Hx of echocardiogram 12/2001   Had previously shown mild MR as well as mild TR with normal systolic function.   Hx of echocardiogram 11/2006   5-years follow-up, this showed normal systolic as well as diastolic function. There was suggestion of flat mitral valve leaflet coaptation with borderline anterior mitral valve leaflet prolapse with mild associated mitral valve prolapse. He had mild TR. There was very mild aortic valve sclerosis without stenosis or insufficiency.   HYPERLIPIDEMIA    HYPOTHYROIDISM    PROCTITIS    TESTICULAR HYPOFUNCTION    Unspecified vitamin D deficiency    VARICOSE VEINS, LOWER EXTREMITIES     Past Surgical History:  Procedure Laterality Date   BACK SURGERY  2019   per pt, low back surgery   DENTAL SURGERY     ear injury     piece of ear plug dislodged from ear   SPINE SURGERY  2019    TONSILECTOMY, ADENOIDECTOMY, BILATERAL MYRINGOTOMY AND TUBES  09/11/1941   TUBAL LIGATION  1965   WISDOM TOOTH EXTRACTION      Current Outpatient Medications  Medication Instructions   cholecalciferol (VITAMIN D) 1,000 Units, Daily   diazepam (VALIUM) 5 mg, Oral, Daily PRN   fluticasone (FLONASE) 50 MCG/ACT nasal spray 1 spray, Each Nare, Daily   HYDROcodone-acetaminophen (NORCO/VICODIN) 5-325 MG tablet 1 tablet, 2 times daily   latanoprost (XALATAN) 0.005 % ophthalmic solution 1 drop, Daily at bedtime   sildenafil (REVATIO) 60-80 mg, Oral, Daily PRN   timolol (BETIMOL) 0.5 % ophthalmic solution 1 drop, 2 times daily       Objective:   Physical Exam BP (!) 129/56   Pulse 60   Temp 98.1 F (36.7 C) (Oral)   Resp 16   Ht 5\' 11"  (1.803 m)   Wt 210 lb 12 oz (95.6 kg)   SpO2 96%   BMI 29.39 kg/m  General:   Well developed, NAD, BMI noted. HEENT:  Normocephalic . Face symmetric, atraumatic Lungs:  CTA B Normal respiratory effort, no intercostal retractions, no accessory muscle use. Heart: RRR,  no murmur.  Lower extremities: no pretibial edema bilaterally  Skin: Not pale. Not jaundice Neurologic:  alert & oriented X3.  Speech normal, gait appropriate for age and unassisted Psych--  Cognition and judgment appear intact.  Cooperative with normal attention span and concentration.  Behavior appropriate. No anxious or depressed appearing.      Assessment   ASSESSMENT Prediabetes Dyslipidemia Anxiety - on valium rx by pcp, get UDS @ pain mngmt  GI: GERD, IBS, several EGDs before  Allergies : Peanuts, pollen, sunlight, trees,(h/o anal pruritus stopped after peanuts dc from diet) Hypogonadism    ED, decreased libido MSK: spinal stenosis, Dr Manon Hilding.  Microdiscectomy 10-2018 Glaucoma Varicose veins H/o goiter, remote bx H/o low vit d H/p palpitations, normal echo 2008, (-) stress test 2009 H/o Chest pain: Myoview  04/27/2020 neg.     PLAN: Prediabetes: Diet  controlled,Last A1c 5.9. Dyslipidemia: Diet controlled, last LDL 105. OSA?  The patient reports she is drowsy, he thinks is due to hypogonadism however he admits to snoring, Epworth scale 10, elevated.  Suspect OSA, referred to neurology. Hypogonadism: In the past testosterone has been normal or slightly low.  He is on chronic hydrocodone can affect testosterone levels.  Recommend to evaluate for OSA first, then check a testosterone level if necessary.     Preventive care reviewed -Td 2018 -  pnm 23: 2011; prevnar 2016 -zostavax 2011 -Had 2 Covid vaccines, hesitant to get a booster -Vaccines I recommend: PNM 20, Shingrix No. 2, flu shot every fall, COVID booster, RSV. Pros>>cons, declined  -Prostate cancer screening: No further screenings. -CCS:  Colonoscopy 2002: Diverticuli, polyp: BX  benign polypoid colonic mucosa. cologuard: (-) 10-2014. (-) 04/25/2020, (-) 07/26/2023 RTC 6 months   ==== LOV 2022 Prediabetes: Will check A1c and BMP. Dyslipidemia: Diet controlled, check FLP. Essential tremor, right hand: With no other neurology symptoms except for occasional spasm at the left upper lip.  Suspect essential tremor, recommend observation for now.  Check TSH. ED: Refill sildenafil Vitamin D deficiency: On supplements, unclear dose.  Recommend 2000 units daily.  Check labs. Preventive care: Declined flu shot, had a reaction to the COVID-vaccine will not take. CCS: No GI symptoms, okay for Cologuard. RTC 4 months.

## 2023-11-20 NOTE — Patient Instructions (Addendum)
 Will refer you to neurology for the evaluation of sleep apnea.  Vaccines I recommend:  Second Shingrix dose Pneumonia shot (PNM 20) Flu shot every fall COVID booster RSV     Please go to the front desk: Arrange for a follow up in 6 months

## 2023-11-21 NOTE — Assessment & Plan Note (Signed)
 Prediabetes: Diet controlled,Last A1c 5.9. Dyslipidemia: Diet controlled, last LDL 105. OSA?  The patient reports she is drowsy, he thinks is due to hypogonadism however he admits to snoring, Epworth scale 10, elevated.  Suspect OSA, referred to neurology. Hypogonadism: In the past testosterone has been normal or slightly low.  He is on chronic hydrocodone which can affect testosterone levels.  Recommend to evaluate for OSA first, then check a testosterone level if necessary.    Preventive care reviewed  RTC 6 months

## 2023-11-21 NOTE — Assessment & Plan Note (Signed)
 Preventive care reviewed -Td 2018 - pnm 23: 2011; prevnar 2016 -zostavax 2011 -Had 2 Covid vaccines, hesitant to get a booster -Vaccines I recommend: PNM 20, Shingrix No. 2, flu shot every fall, COVID booster, RSV. Pros>>cons, he is not inclined to proceed with any vaccines -Prostate cancer screening: No further screenings. -CCS:  Colonoscopy 2002: Diverticuli, polyp: BX  benign polypoid colonic mucosa. cologuard: (-) 10-2014. (-) 04/25/2020, (-) 07/26/2023

## 2023-11-30 DIAGNOSIS — H401131 Primary open-angle glaucoma, bilateral, mild stage: Secondary | ICD-10-CM | POA: Diagnosis not present

## 2023-12-05 ENCOUNTER — Institutional Professional Consult (permissible substitution): Admitting: Neurology

## 2023-12-26 DIAGNOSIS — G894 Chronic pain syndrome: Secondary | ICD-10-CM | POA: Diagnosis not present

## 2023-12-26 DIAGNOSIS — M62830 Muscle spasm of back: Secondary | ICD-10-CM | POA: Diagnosis not present

## 2023-12-26 DIAGNOSIS — Z79891 Long term (current) use of opiate analgesic: Secondary | ICD-10-CM | POA: Diagnosis not present

## 2023-12-26 DIAGNOSIS — G4700 Insomnia, unspecified: Secondary | ICD-10-CM | POA: Diagnosis not present

## 2023-12-26 DIAGNOSIS — M4807 Spinal stenosis, lumbosacral region: Secondary | ICD-10-CM | POA: Diagnosis not present

## 2024-01-23 DIAGNOSIS — M4807 Spinal stenosis, lumbosacral region: Secondary | ICD-10-CM | POA: Diagnosis not present

## 2024-01-23 DIAGNOSIS — G4700 Insomnia, unspecified: Secondary | ICD-10-CM | POA: Diagnosis not present

## 2024-01-23 DIAGNOSIS — G894 Chronic pain syndrome: Secondary | ICD-10-CM | POA: Diagnosis not present

## 2024-01-23 DIAGNOSIS — M62830 Muscle spasm of back: Secondary | ICD-10-CM | POA: Diagnosis not present

## 2024-02-21 DIAGNOSIS — M4807 Spinal stenosis, lumbosacral region: Secondary | ICD-10-CM | POA: Diagnosis not present

## 2024-02-21 DIAGNOSIS — G4700 Insomnia, unspecified: Secondary | ICD-10-CM | POA: Diagnosis not present

## 2024-02-21 DIAGNOSIS — G894 Chronic pain syndrome: Secondary | ICD-10-CM | POA: Diagnosis not present

## 2024-02-21 DIAGNOSIS — M62830 Muscle spasm of back: Secondary | ICD-10-CM | POA: Diagnosis not present

## 2024-03-04 ENCOUNTER — Telehealth: Payer: Self-pay | Admitting: Neurology

## 2024-03-04 NOTE — Telephone Encounter (Signed)
 Pt no longer wants the appointment

## 2024-03-06 ENCOUNTER — Institutional Professional Consult (permissible substitution): Admitting: Neurology

## 2024-03-17 ENCOUNTER — Encounter: Payer: Self-pay | Admitting: Internal Medicine

## 2024-03-17 ENCOUNTER — Ambulatory Visit (INDEPENDENT_AMBULATORY_CARE_PROVIDER_SITE_OTHER): Admitting: Internal Medicine

## 2024-03-17 VITALS — BP 126/80 | HR 63 | Temp 97.8°F | Resp 16 | Ht 71.0 in | Wt 204.5 lb

## 2024-03-17 DIAGNOSIS — T63461A Toxic effect of venom of wasps, accidental (unintentional), initial encounter: Secondary | ICD-10-CM

## 2024-03-17 DIAGNOSIS — W57XXXA Bitten or stung by nonvenomous insect and other nonvenomous arthropods, initial encounter: Secondary | ICD-10-CM

## 2024-03-17 MED ORDER — BETAMETHASONE DIPROPIONATE AUG 0.05 % EX CREA: TOPICAL_CREAM | Freq: Two times a day (BID) | CUTANEOUS | 0 refills | Status: AC

## 2024-03-17 MED ORDER — CEPHALEXIN 500 MG PO CAPS: 500.0000 mg | ORAL_CAPSULE | Freq: Four times a day (QID) | ORAL | 0 refills | Status: DC

## 2024-03-17 MED ORDER — EPINEPHRINE 0.3 MG/0.3ML IJ SOAJ: 0.3000 mg | INTRAMUSCULAR | 1 refills | Status: AC | PRN

## 2024-03-17 NOTE — Assessment & Plan Note (Signed)
 Insect bite with allergic reaction: Symptoms as described above, the redness is getting bigger, he might be developing early cellulitis. Plan: Cephalexin , topical Diprolene , call if not gradually better Wasp allergies: Request a prescription for EpiPen , he is very familiar with this medication and has used it before.  Rx printed.

## 2024-03-17 NOTE — Patient Instructions (Addendum)
 Take the antibiotic called cephalexin  for 5 days  Apply a cream called Diprolene  twice daily for the next few days  Call if not gradually better

## 2024-03-17 NOTE — Progress Notes (Signed)
 Subjective:    Patient ID: Tyrone Diaz, male    DOB: [birth year], 83 y.o.   MRN: [id number]  DOS:  03/17/2024 Type of visit - description: acute  Developed a rash on the right arm about a week ago. Happened immediately after he was working on his yard.  Think he had an insect bite. He showed me a picture of the area from 2 days ago and the redness has spread. The rash was very pruritic.  Denies fever or chills. No discharge or openings. No tick bite.  Also requesting EpiPen  refill.  Review of Systems See above   Past Medical History:  Diagnosis Date   Allergy 1997   ANEMIA    ANXIETY    ARTHRITIS, CERVICAL SPINE    BCC (basal cell carcinoma of skin)    Cataracts, bilateral    GERD    Glaucoma 1997   GLAUCOMA, BILATERAL    Heart murmur 1975   History of stress test 12/2001   Showed normal perfusion with mild apical thinning.   History of stress test 11/2007   Post stress myocardial perfusion images show a normal pattern of perfusion in the all regions, the post stress left ventricular is normal in size. There is no scintigraphic evidence of inducible myocardial ischemia. The post EF 64%   Hx of echocardiogram 12/2001   Had previously shown mild MR as well as mild TR with normal systolic function.   Hx of echocardiogram 11/2006   5-years follow-up, this showed normal systolic as well as diastolic function. There was suggestion of flat mitral valve leaflet coaptation with borderline anterior mitral valve leaflet prolapse with mild associated mitral valve prolapse. He had mild TR. There was very mild aortic valve sclerosis without stenosis or insufficiency.   HYPERLIPIDEMIA    HYPOTHYROIDISM    PROCTITIS    TESTICULAR HYPOFUNCTION    Unspecified vitamin D  deficiency    VARICOSE VEINS, LOWER EXTREMITIES     Past Surgical History:  Procedure Laterality Date   BACK SURGERY  2019   per pt, low back surgery   DENTAL SURGERY     ear injury     piece of ear plug  dislodged from ear   SPINE SURGERY  2019   TONSILECTOMY, ADENOIDECTOMY, BILATERAL MYRINGOTOMY AND TUBES  09/11/1941   TUBAL LIGATION  1965   WISDOM TOOTH EXTRACTION      Current Outpatient Medications  Medication Instructions   cholecalciferol (VITAMIN D ) 1,000 Units, Daily   diazepam  (VALIUM ) 5 mg, Oral, Daily PRN   fluticasone  (FLONASE ) 50 MCG/ACT nasal spray 1 spray, Each Nare, Daily   HYDROcodone -acetaminophen  (NORCO/VICODIN) 5-325 MG tablet 1 tablet, 2 times daily   latanoprost (XALATAN) 0.005 % ophthalmic solution 1 drop, Daily at bedtime   sildenafil  (REVATIO ) 60-80 mg, Oral, Daily PRN   timolol (BETIMOL) 0.5 % ophthalmic solution 1 drop, 2 times daily       Objective:   Physical Exam Skin:       BP 126/80   Pulse 63   Temp 97.8 F (36.6 C) (Oral)   Resp 16   Ht 5' 11 (1.803 m)   Wt 204 lb 8 oz (92.8 kg)   SpO2 95%   BMI 28.52 kg/m  General:   Well developed, NAD, BMI noted. Skin: See graphic Neurologic:  alert & oriented X3.  Speech normal, gait appropriate for age and unassisted Psych--  Cognition and judgment appear intact.  Cooperative with normal attention span and concentration.  Behavior appropriate.  No anxious or depressed appearing.      Assessment    ASSESSMENT Prediabetes Dyslipidemia Anxiety - on valium  rx by pcp, get UDS @ pain mngmt  GI: GERD, IBS, several EGDs before  Allergies : Peanuts, pollen, sunlight, trees, wasps (has EpiPen ). Hypogonadism    ED, decreased libido MSK: spinal stenosis, Dr Cozetta.  Microdiscectomy 10-2018 Glaucoma Varicose veins H/o goiter, remote bx H/o low vit d H/p palpitations, normal echo 2008, (-) stress test 2009 H/o Chest pain: Myoview   04/27/2020 neg.     PLAN: Insect bite with allergic reaction: Symptoms as described above, the redness is getting bigger, he might be developing early cellulitis. Plan: Cephalexin , topical Diprolene , call if not gradually better Wasp allergies: Request a prescription  for EpiPen , he is very familiar with this medication and has used it before.  Rx printed.

## 2024-03-20 DIAGNOSIS — G894 Chronic pain syndrome: Secondary | ICD-10-CM | POA: Diagnosis not present

## 2024-03-20 DIAGNOSIS — M62830 Muscle spasm of back: Secondary | ICD-10-CM | POA: Diagnosis not present

## 2024-03-20 DIAGNOSIS — M4807 Spinal stenosis, lumbosacral region: Secondary | ICD-10-CM | POA: Diagnosis not present

## 2024-03-20 DIAGNOSIS — G4700 Insomnia, unspecified: Secondary | ICD-10-CM | POA: Diagnosis not present

## 2024-03-24 ENCOUNTER — Other Ambulatory Visit: Payer: Self-pay | Admitting: Internal Medicine

## 2024-04-17 DIAGNOSIS — M62830 Muscle spasm of back: Secondary | ICD-10-CM | POA: Diagnosis not present

## 2024-04-17 DIAGNOSIS — M4807 Spinal stenosis, lumbosacral region: Secondary | ICD-10-CM | POA: Diagnosis not present

## 2024-04-17 DIAGNOSIS — G894 Chronic pain syndrome: Secondary | ICD-10-CM | POA: Diagnosis not present

## 2024-04-17 DIAGNOSIS — G4700 Insomnia, unspecified: Secondary | ICD-10-CM | POA: Diagnosis not present

## 2024-04-25 ENCOUNTER — Ambulatory Visit (INDEPENDENT_AMBULATORY_CARE_PROVIDER_SITE_OTHER): Admitting: Internal Medicine

## 2024-04-25 ENCOUNTER — Encounter: Payer: Self-pay | Admitting: Internal Medicine

## 2024-04-25 VITALS — BP 130/84 | HR 67 | Temp 98.4°F | Resp 16 | Ht 71.0 in | Wt 208.5 lb

## 2024-04-25 DIAGNOSIS — E785 Hyperlipidemia, unspecified: Secondary | ICD-10-CM

## 2024-04-25 DIAGNOSIS — M48061 Spinal stenosis, lumbar region without neurogenic claudication: Secondary | ICD-10-CM | POA: Diagnosis not present

## 2024-04-25 DIAGNOSIS — E0590 Thyrotoxicosis, unspecified without thyrotoxic crisis or storm: Secondary | ICD-10-CM | POA: Diagnosis not present

## 2024-04-25 DIAGNOSIS — R739 Hyperglycemia, unspecified: Secondary | ICD-10-CM

## 2024-04-25 NOTE — Progress Notes (Signed)
 Subjective:    Patient ID: Tyrone Diaz, male    DOB: [birth year], 83 y.o.   MRN: [id number]  DOS:  04/25/2024 Type of visit - description: Follow-up  Since the last office visit he is feeling well and has no concern. He canceled the OSA evaluation, feeling well, good energy, denies any drowsiness.  Wt Readings from Last 3 Encounters:  04/25/24 208 lb 8 oz (94.6 kg)  03/17/24 204 lb 8 oz (92.8 kg)  11/20/23 210 lb 12 oz (95.6 kg)    Review of Systems See above   Past Medical History:  Diagnosis Date   Allergy 1997   ANEMIA    ANXIETY    ARTHRITIS, CERVICAL SPINE    BCC (basal cell carcinoma of skin)    Cataracts, bilateral    GERD    Glaucoma 1997   GLAUCOMA, BILATERAL    Heart murmur 1975   History of stress test 12/2001   Showed normal perfusion with mild apical thinning.   History of stress test 11/2007   Post stress myocardial perfusion images show a normal pattern of perfusion in the all regions, the post stress left ventricular is normal in size. There is no scintigraphic evidence of inducible myocardial ischemia. The post EF 64%   Hx of echocardiogram 12/2001   Had previously shown mild MR as well as mild TR with normal systolic function.   Hx of echocardiogram 11/2006   5-years follow-up, this showed normal systolic as well as diastolic function. There was suggestion of flat mitral valve leaflet coaptation with borderline anterior mitral valve leaflet prolapse with mild associated mitral valve prolapse. He had mild TR. There was very mild aortic valve sclerosis without stenosis or insufficiency.   HYPERLIPIDEMIA    HYPOTHYROIDISM    PROCTITIS    TESTICULAR HYPOFUNCTION    Unspecified vitamin D deficiency    VARICOSE VEINS, LOWER EXTREMITIES     Past Surgical History:  Procedure Laterality Date   BACK SURGERY  2019   per pt, low back surgery   DENTAL SURGERY     ear injury     piece of ear plug dislodged from ear   SPINE SURGERY  2019   TONSILECTOMY,  ADENOIDECTOMY, BILATERAL MYRINGOTOMY AND TUBES  09/11/1941   TUBAL LIGATION  1965   WISDOM TOOTH EXTRACTION      Current Outpatient Medications  Medication Instructions   augmented betamethasone dipropionate (DIPROLENE-AF) 0.05 % cream Topical, 2 times daily   cholecalciferol (VITAMIN D) 1,000 Units, Daily   diazepam (VALIUM) 5 mg, Oral, Daily PRN   EPINEPHrine (EPIPEN 2-PAK) 0.3 mg, Intramuscular, As needed   fluticasone (FLONASE) 50 MCG/ACT nasal spray 1 spray, Each Nare, Daily   HYDROcodone-acetaminophen (NORCO/VICODIN) 5-325 MG tablet 1 tablet, 2 times daily   latanoprost (XALATAN) 0.005 % ophthalmic solution 1 drop, Daily at bedtime   sildenafil (REVATIO) 60-80 mg, Oral, Daily PRN   timolol (BETIMOL) 0.5 % ophthalmic solution 1 drop, 2 times daily       Objective:   Physical Exam BP 130/84   Pulse 67   Temp 98.4 F (36.9 C) (Oral)   Resp 16   Ht 5' 11 (1.803 m)   Wt 208 lb 8 oz (94.6 kg)   SpO2 94%   BMI 29.08 kg/m  General:   Well developed, NAD, BMI noted. HEENT:  Normocephalic . Face symmetric, atraumatic Lungs:  CTA B Normal respiratory effort, no intercostal retractions, no accessory muscle use. Heart: RRR,  no murmur.  Lower  extremities: no pretibial edema bilaterally  Skin: Not pale. Not jaundice Neurologic:  alert & oriented X3.  Speech normal, gait appropriate for age and unassisted Psych--  Cognition and judgment appear intact.  Cooperative with normal attention span and concentration.  Behavior appropriate. No anxious or depressed appearing.      Assessment   ASSESSMENT Prediabetes Dyslipidemia Anxiety - on valium  rx by pcp, get UDS @ pain mngmt  GI: GERD, IBS, several EGDs before  Allergies : Peanuts, pollen, sunlight, trees, wasps (has EpiPen ). Hypogonadism    ED, decreased libido MSK: spinal stenosis, Dr Cozetta.  Microdiscectomy 10-2018 Glaucoma Varicose veins H/o goiter, remote bx H/o low vit d H/p palpitations, normal echo 2008, (-)  stress test 2009 H/o Chest pain: Myoview   04/27/2020 neg.     PLAN: Prediabetes: Check A1c. Dyslipidemia: Diet controlled  MSK, spinal stenosis: Per pain management. Suppressed TSH: Check TFTs, CBC.  Denies any fever, chills or unusual weight loss. OSA?  Patient canceled the referral, feeling great, no drowsiness. Vaccines: Again he states he will not take any vaccines. RTC 6 months for a checkup

## 2024-04-25 NOTE — Patient Instructions (Addendum)
   GO TO THE LAB :  Get the blood work   Your results will be posted on MyChart with my comments  Next office visit for a checkup in 6 months Please make an appointment before you leave today

## 2024-04-27 NOTE — Assessment & Plan Note (Signed)
 Prediabetes: Check A1c. Dyslipidemia: Diet controlled  MSK, spinal stenosis: Per pain management. Suppressed TSH: Check TFTs, CBC.  Denies any fever, chills or unusual weight loss. OSA?  Patient canceled the referral, feeling great, no drowsiness. Vaccines: Again he states he will not take any vaccines. RTC 6 months for a checkup

## 2024-05-14 ENCOUNTER — Other Ambulatory Visit

## 2024-05-14 DIAGNOSIS — R739 Hyperglycemia, unspecified: Secondary | ICD-10-CM | POA: Diagnosis not present

## 2024-05-14 DIAGNOSIS — E0590 Thyrotoxicosis, unspecified without thyrotoxic crisis or storm: Secondary | ICD-10-CM | POA: Diagnosis not present

## 2024-05-15 LAB — CBC WITH DIFFERENTIAL/PLATELET
Absolute Lymphocytes: 2419 {cells}/uL (ref 850–3900)
Absolute Monocytes: 536 {cells}/uL (ref 200–950)
Basophils Absolute: 69 {cells}/uL (ref 0–200)
Basophils Relative: 1.1 %
Eosinophils Absolute: 334 {cells}/uL (ref 15–500)
Eosinophils Relative: 5.3 %
HCT: 41 % (ref 38.5–50.0)
Hemoglobin: 13.5 g/dL (ref 13.2–17.1)
MCH: 30.1 pg (ref 27.0–33.0)
MCHC: 32.9 g/dL (ref 32.0–36.0)
MCV: 91.5 fL (ref 80.0–100.0)
MPV: 9.5 fL (ref 7.5–12.5)
Monocytes Relative: 8.5 %
Neutro Abs: 2942 {cells}/uL (ref 1500–7800)
Neutrophils Relative %: 46.7 %
Platelets: 312 Thousand/uL (ref 140–400)
RBC: 4.48 Million/uL (ref 4.20–5.80)
RDW: 13.3 % (ref 11.0–15.0)
Total Lymphocyte: 38.4 %
WBC: 6.3 Thousand/uL (ref 3.8–10.8)

## 2024-05-15 LAB — TSH: TSH: 0.26 m[IU]/L — ABNORMAL LOW (ref 0.40–4.50)

## 2024-05-15 LAB — T4, FREE: Free T4: 1.2 ng/dL (ref 0.8–1.8)

## 2024-05-15 LAB — HEMOGLOBIN A1C
Hgb A1c MFr Bld: 5.9 % — ABNORMAL HIGH (ref <5.7)
Mean Plasma Glucose: 123 mg/dL
eAG (mmol/L): 6.8 mmol/L

## 2024-05-15 LAB — T3, FREE: T3, Free: 3.4 pg/mL (ref 2.3–4.2)

## 2024-05-16 ENCOUNTER — Ambulatory Visit: Payer: Self-pay | Admitting: Family

## 2024-05-23 ENCOUNTER — Ambulatory Visit: Admitting: Internal Medicine

## 2024-06-05 DIAGNOSIS — H401131 Primary open-angle glaucoma, bilateral, mild stage: Secondary | ICD-10-CM | POA: Diagnosis not present

## 2024-06-05 DIAGNOSIS — H25813 Combined forms of age-related cataract, bilateral: Secondary | ICD-10-CM | POA: Diagnosis not present

## 2024-07-07 DIAGNOSIS — G4700 Insomnia, unspecified: Secondary | ICD-10-CM | POA: Diagnosis not present

## 2024-07-07 DIAGNOSIS — M4807 Spinal stenosis, lumbosacral region: Secondary | ICD-10-CM | POA: Diagnosis not present

## 2024-07-07 DIAGNOSIS — G894 Chronic pain syndrome: Secondary | ICD-10-CM | POA: Diagnosis not present

## 2024-07-07 DIAGNOSIS — M62830 Muscle spasm of back: Secondary | ICD-10-CM | POA: Diagnosis not present

## 2024-08-05 ENCOUNTER — Ambulatory Visit: Payer: Self-pay

## 2024-08-05 ENCOUNTER — Ambulatory Visit: Admitting: Family Medicine

## 2024-08-05 VITALS — BP 128/82 | HR 66 | Temp 97.7°F | Resp 16 | Ht 71.0 in | Wt 207.4 lb

## 2024-08-05 DIAGNOSIS — B353 Tinea pedis: Secondary | ICD-10-CM | POA: Diagnosis not present

## 2024-08-05 MED ORDER — KETOCONAZOLE 2 % EX CREA: 1.0000 | TOPICAL_CREAM | Freq: Every day | CUTANEOUS | 0 refills | Status: AC

## 2024-08-05 NOTE — Patient Instructions (Addendum)
 Try not to scratch as this can make things worse. Avoid scented products while dealing with this. You may resume when the itchiness resolves.   Try to keep the area clean and dry.   Let us  know if you need anything.

## 2024-08-05 NOTE — Telephone Encounter (Signed)
 Appt scheduled

## 2024-08-05 NOTE — Progress Notes (Signed)
 Chief Complaint  Patient presents with   Foot Swelling    Right Foot Swelling    Tyrone Diaz is a 83 y.o. male here for a skin complaint.  Duration: 1 week Location: R foot Pruritic? Yes Painful? Yes-some burning Drainage? No New soaps/lotions/topicals/detergents? No Trauma? No Other associated symptoms: getting worse; swelling; no fevers Therapies tried thus far: ice, topical for stinging, hydrogen peroxide, topical steroid  Past Medical History:  Diagnosis Date   Allergy 1997   ANEMIA    ANXIETY    ARTHRITIS, CERVICAL SPINE    BCC (basal cell carcinoma of skin)    Cataracts, bilateral    GERD    Glaucoma 1997   GLAUCOMA, BILATERAL    Heart murmur 1975   History of stress test 12/2001   Showed normal perfusion with mild apical thinning.   History of stress test 11/2007   Post stress myocardial perfusion images show a normal pattern of perfusion in the all regions, the post stress left ventricular is normal in size. There is no scintigraphic evidence of inducible myocardial ischemia. The post EF 64%   Hx of echocardiogram 12/2001   Had previously shown mild MR as well as mild TR with normal systolic function.   Hx of echocardiogram 11/2006   5-years follow-up, this showed normal systolic as well as diastolic function. There was suggestion of flat mitral valve leaflet coaptation with borderline anterior mitral valve leaflet prolapse with mild associated mitral valve prolapse. He had mild TR. There was very mild aortic valve sclerosis without stenosis or insufficiency.   HYPERLIPIDEMIA    HYPOTHYROIDISM    PROCTITIS    TESTICULAR HYPOFUNCTION    Unspecified vitamin D  deficiency    VARICOSE VEINS, LOWER EXTREMITIES     BP 128/82 (BP Location: Left Arm, Patient Position: Sitting)   Pulse 66   Temp 97.7 F (36.5 C) (Oral)   Resp 16   Ht 5' 11 (1.803 m)   Wt 207 lb 6.4 oz (94.1 kg)   SpO2 99%   BMI 28.93 kg/m  Gen: awake, alert, appearing stated age Lungs:  No accessory muscle use Skin: macerated tissue between 4th and 5th digit on R foot. No drainage, erythema, TTP, fluctuance, excoriation Psych: Age appropriate judgment and insight  Tinea pedis of right foot - Plan: ketoconazole  (NIZORAL ) 2 % cream 6 weeks of ketoconazole .  Keep the area clean and dry overall.  Let me know if there are worsening symptoms. F/u prn. The patient voiced understanding and agreement to the plan.  Mabel [place], DO 08/05/24 2:52 PM

## 2024-08-05 NOTE — Telephone Encounter (Signed)
 FYI Only or Action Required?: FYI only for provider: appointment scheduled on 08/05/2024.  Patient was last seen in primary care on 04/25/2024 by Tyrone Aloysius BRAVO, MD.  Called Nurse Triage reporting Foot Swelling.  Symptoms began x 2 days.  Interventions attempted: Nothing.  Symptoms are: gradually worsening.  Triage Disposition: See Physician Within 24 Hours  Patient/caregiver understands and will follow disposition?: Yes   Copied from CRM #[id number]. Topic: Clinical - Red Word Triage >> Aug 05, 2024  8:07 AM Thersia BROCKS wrote: Red Word that prompted transfer to Nurse Triage: Patient wife called in stated patients foot is swollen, stated he isnt any pain but is real itchy and swollen Reason for Disposition  [1] MODERATE leg swelling (e.g., swelling extends up to knees) AND [2] new-onset or getting worse  Answer Assessment - Initial Assessment Questions 1. ONSET: When did the swelling start? (e.g., minutes, hours, days)     X 2 day 2. LOCATION: What part of the leg is swollen?  Are both legs swollen or just one leg?     Right foot- toes, between toes, top from toes upward about 3 3. SEVERITY: How bad is the swelling? (e.g., localized; mild, moderate, severe)     Mild to moderate 4. REDNESS: Is there redness or signs of infection?     Redness - rash redness, 2 pus pockets 5. PAIN: Is the swelling painful to touch? If Yes, ask: How painful is it?   (Scale 1-10; mild, moderate or severe)     discomfort 6. FEVER: Do you have a fever? If Yes, ask: What is it, how was it measured, and when did it start?      no 7. CAUSE: What do you think is causing the leg swelling?     unknown 8. MEDICAL HISTORY: Do you have a history of blood clots (e.g., DVT), cancer, heart failure, kidney disease, or liver failure?     na 9. RECURRENT SYMPTOM: Have you had leg swelling before? If Yes, ask: When was the last time? What happened that time?     no 10. OTHER SYMPTOMS: Do you  have any other symptoms? (e.g., chest pain, difficulty breathing)       no 11. PREGNANCY: Is there any chance you are pregnant? When was your last menstrual period?       Na History cellulitis  Protocols used: Leg Swelling and Edema-A-AH

## 2024-08-06 DIAGNOSIS — M4807 Spinal stenosis, lumbosacral region: Secondary | ICD-10-CM | POA: Diagnosis not present

## 2024-08-06 DIAGNOSIS — G4700 Insomnia, unspecified: Secondary | ICD-10-CM | POA: Diagnosis not present

## 2024-08-06 DIAGNOSIS — G894 Chronic pain syndrome: Secondary | ICD-10-CM | POA: Diagnosis not present

## 2024-08-06 DIAGNOSIS — M62830 Muscle spasm of back: Secondary | ICD-10-CM | POA: Diagnosis not present

## 2024-10-27 ENCOUNTER — Ambulatory Visit: Admitting: Internal Medicine

## 2024-11-20 ENCOUNTER — Ambulatory Visit
